# Patient Record
Sex: Male | Born: 1960
Health system: Southern US, Community
[De-identification: ages and names within clinical notes are randomized; demographics above are authoritative.]

## PROBLEM LIST (undated history)

## (undated) DIAGNOSIS — K219 Gastro-esophageal reflux disease without esophagitis: Secondary | ICD-10-CM

## (undated) DIAGNOSIS — C801 Malignant (primary) neoplasm, unspecified: Secondary | ICD-10-CM

## (undated) DIAGNOSIS — K859 Acute pancreatitis without necrosis or infection, unspecified: Secondary | ICD-10-CM

## (undated) DIAGNOSIS — N189 Chronic kidney disease, unspecified: Secondary | ICD-10-CM

## (undated) DIAGNOSIS — A159 Respiratory tuberculosis unspecified: Secondary | ICD-10-CM

## (undated) DIAGNOSIS — M1990 Unspecified osteoarthritis, unspecified site: Secondary | ICD-10-CM

## (undated) HISTORY — PX: FRACTURE SURGERY: SHX138

---

## 1998-05-20 ENCOUNTER — Emergency Department (HOSPITAL_COMMUNITY): Admission: EM | Admit: 1998-05-20 | Discharge: 1998-05-20 | Payer: Self-pay

## 2002-05-31 ENCOUNTER — Emergency Department (HOSPITAL_COMMUNITY): Admission: EM | Admit: 2002-05-31 | Discharge: 2002-05-31 | Payer: Self-pay | Admitting: Emergency Medicine

## 2004-01-31 ENCOUNTER — Emergency Department (HOSPITAL_COMMUNITY): Admission: EM | Admit: 2004-01-31 | Discharge: 2004-01-31 | Payer: Self-pay | Admitting: Emergency Medicine

## 2004-02-09 ENCOUNTER — Emergency Department (HOSPITAL_COMMUNITY): Admission: EM | Admit: 2004-02-09 | Discharge: 2004-02-09 | Payer: Self-pay | Admitting: Family Medicine

## 2004-10-23 ENCOUNTER — Ambulatory Visit: Payer: Self-pay | Admitting: Internal Medicine

## 2004-10-23 ENCOUNTER — Ambulatory Visit: Payer: Self-pay | Admitting: Pulmonary Disease

## 2004-10-23 ENCOUNTER — Inpatient Hospital Stay (HOSPITAL_COMMUNITY): Admission: EM | Admit: 2004-10-23 | Discharge: 2004-10-28 | Payer: Self-pay | Admitting: Family Medicine

## 2005-02-24 ENCOUNTER — Ambulatory Visit: Payer: Self-pay | Admitting: Infectious Diseases

## 2014-03-06 ENCOUNTER — Emergency Department (HOSPITAL_COMMUNITY)
Admission: EM | Admit: 2014-03-06 | Discharge: 2014-03-06 | Disposition: A | Discharge status: Home / Self Care | Payer: PRIVATE HEALTH INSURANCE | Attending: Emergency Medicine | Admitting: Emergency Medicine

## 2014-03-06 ENCOUNTER — Encounter (HOSPITAL_COMMUNITY): Payer: Self-pay | Admitting: Emergency Medicine

## 2014-03-06 DIAGNOSIS — F17200 Nicotine dependence, unspecified, uncomplicated: Secondary | ICD-10-CM | POA: Insufficient documentation

## 2014-03-06 DIAGNOSIS — L259 Unspecified contact dermatitis, unspecified cause: Secondary | ICD-10-CM | POA: Insufficient documentation

## 2014-03-06 MED ORDER — PREDNISONE 20 MG PO TABS: 40.0000 mg | ORAL_TABLET | Freq: Every day | ORAL | Status: DC

## 2014-03-06 MED ORDER — TRIAMCINOLONE ACETONIDE 0.1 % EX CREA: 1.0000 "application " | TOPICAL_CREAM | Freq: Two times a day (BID) | CUTANEOUS | Status: DC

## 2014-03-06 MED ORDER — HYDROXYZINE HCL 25 MG PO TABS: 25.0000 mg | ORAL_TABLET | Freq: Four times a day (QID) | ORAL | Status: DC

## 2014-03-06 NOTE — ED Provider Notes (Signed)
Medical screening examination/treatment/procedure(s) were performed by non-physician practitioner and as supervising physician I was immediately available for consultation/collaboration.   EKG Interpretation None        Ezequiel Essex, MD 03/06/14 [phone]

## 2014-03-06 NOTE — ED Notes (Signed)
Pt states he started feeling itchy last night.  Pt states he was doing yard work yesterday.  Someone cut a vine going up a tree and stuff flew back on him.  Pt has swelling to face.  Airway intact.  Pt speaking in full complete sentences.

## 2014-03-06 NOTE — ED Provider Notes (Signed)
CSN: [id number]     Arrival date & time 03/06/14  0910 History  This chart was scribed for Noland Fordyce, PA working with Ezequiel Essex, MD by Roxan Diesel, ED Scribe. This patient was seen in room TR05C/TR05C and the patient's care was started at 10:01 AM.   Chief Complaint  Patient presents with  . Rash    The history is provided by the patient. No language interpreter was used.    HPI Comments: Stephen [name] is a 53 y.o. male who presents to the Emergency Department complaining of an itchy rash on his face and genital area that began on waking this morning.  Pt states he was doing yard work yesterday and someone cut a vine and it flew back onto him.  He feels the rash may be due to poison ivy.  He states the rash is only on his face and genital area and denies involvement of inside of mouth or nose, chest, back or legs.  He has not attempted to treat rash pta.  In addition to the rash he also presents with swelling to his left eyelids, with associated intermittent itching to the left eye.  He denies difficulty swallowing.  He denies prior episodes of poison ivy exposure.   History reviewed. No pertinent past medical history.   Past Surgical History  Procedure Laterality Date  . Fracture surgery      No family history on file.   History  Substance Use Topics  . Smoking status: Current Every Day Smoker  . Smokeless tobacco: Not on file  . Alcohol Use: Yes     Comment: Beer daily, but none today.     Review of Systems  HENT: Positive for facial swelling. Negative for trouble swallowing.   Eyes: Positive for itching.  Skin: Positive for rash.  All other systems reviewed and are negative.     Allergies  Review of patient's allergies indicates no known allergies.  Home Medications   Prior to Admission medications   Not on File   BP 145/88  Pulse 91  Temp(Src) 97.5 F (36.4 C) (Oral)  Resp 18  SpO2 100%  Physical Exam  Nursing note and vitals  reviewed. Constitutional: He is oriented to person, place, and time. He appears well-developed and well-nourished.  HENT:  Head: Normocephalic and atraumatic.  Eyes: EOM are normal.  Neck: Normal range of motion.  Cardiovascular: Normal rate.   Pulmonary/Chest: Effort normal.  Genitourinary:  Chaperoned exam.  Mild erythema to scrotum.  No vesicles or discharge.  Musculoskeletal: Normal range of motion.  Neurological: He is alert and oriented to person, place, and time.  Skin: Skin is warm and dry. Rash noted.  Diffuse facial edema with erythema, worse on left side of face and around left eye, consistent with contact dermatitis, without evidence of underlying infection.  No eye discharge or erythema.  No discharge from rash to face.  Psychiatric: He has a normal mood and affect. His behavior is normal.    ED Course  Procedures (including critical care time)  DIAGNOSTIC STUDIES: Oxygen Saturation is 100% on room air, normal by my interpretation.    COORDINATION OF CARE: 10:05 AM-Discussed treatment plan which includes prednisone, hydroxyzine, and triamcinolone cream with pt at bedside and pt agreed to plan.     Labs Review Labs Reviewed - No data to display  Imaging Review No results found.   EKG Interpretation None      MDM   Final diagnoses:  Contact dermatitis  Pt presenting with contact dermatitis to face and scrotum.  No evidence of underlying infection. Will tx with prednisone x5 days, atarax for itching as well as triamcinolone cream. Advised pt to only use cream to his face sparingly for severe itching.  May use on other satellite lesions not on his face that may appear later. Return precautions provided. Pt verbalized understanding and agreement with tx plan.   I personally performed the services described in this documentation, which was scribed in my presence. The recorded information has been reviewed and is accurate.   Noland Fordyce, PA-C 03/06/14  1043

## 2014-03-06 NOTE — ED Notes (Signed)
Facial rash/swelling.   Body rash.  Patient states he got into some poison oak.

## 2015-04-14 ENCOUNTER — Encounter (HOSPITAL_COMMUNITY): Payer: Self-pay | Admitting: *Deleted

## 2015-04-14 ENCOUNTER — Emergency Department (HOSPITAL_COMMUNITY)
Admission: EM | Admit: 2015-04-14 | Discharge: 2015-04-14 | Disposition: A | Discharge status: Home / Self Care | Payer: Non-veteran care | Attending: Emergency Medicine | Admitting: Emergency Medicine

## 2015-04-14 ENCOUNTER — Emergency Department (HOSPITAL_COMMUNITY): Payer: Non-veteran care

## 2015-04-14 DIAGNOSIS — K852 Alcohol induced acute pancreatitis without necrosis or infection: Secondary | ICD-10-CM

## 2015-04-14 DIAGNOSIS — R1013 Epigastric pain: Secondary | ICD-10-CM | POA: Diagnosis present

## 2015-04-14 DIAGNOSIS — Z720 Tobacco use: Secondary | ICD-10-CM | POA: Insufficient documentation

## 2015-04-14 DIAGNOSIS — Z7952 Long term (current) use of systemic steroids: Secondary | ICD-10-CM | POA: Insufficient documentation

## 2015-04-14 LAB — CBC WITH DIFFERENTIAL/PLATELET
BASOS ABS: 0 10*3/uL (ref 0.0–0.1)
Basophils Relative: 0 % (ref 0–1)
EOS ABS: 0 10*3/uL (ref 0.0–0.7)
Eosinophils Relative: 0 % (ref 0–5)
HCT: 38 % — ABNORMAL LOW (ref 39.0–52.0)
Hemoglobin: 12.7 g/dL — ABNORMAL LOW (ref 13.0–17.0)
Lymphocytes Relative: 9 % — ABNORMAL LOW (ref 12–46)
Lymphs Abs: 0.9 10*3/uL (ref 0.7–4.0)
MCH: 28.9 pg (ref 26.0–34.0)
MCHC: 33.4 g/dL (ref 30.0–36.0)
MCV: 86.4 fL (ref 78.0–100.0)
MONO ABS: 0.5 10*3/uL (ref 0.1–1.0)
Monocytes Relative: 5 % (ref 3–12)
Neutro Abs: 8.5 10*3/uL — ABNORMAL HIGH (ref 1.7–7.7)
Neutrophils Relative %: 86 % — ABNORMAL HIGH (ref 43–77)
Platelets: 249 10*3/uL (ref 150–400)
RBC: 4.4 MIL/uL (ref 4.22–5.81)
RDW: 14.5 % (ref 11.5–15.5)
WBC: 9.9 10*3/uL (ref 4.0–10.5)

## 2015-04-14 LAB — COMPREHENSIVE METABOLIC PANEL
ALT: 76 U/L — ABNORMAL HIGH (ref 17–63)
AST: 86 U/L — ABNORMAL HIGH (ref 15–41)
Albumin: 3.9 g/dL (ref 3.5–5.0)
Alkaline Phosphatase: 110 U/L (ref 38–126)
Anion gap: 12 (ref 5–15)
BUN: 7 mg/dL (ref 6–20)
CHLORIDE: 96 mmol/L — AB (ref 101–111)
CO2: 27 mmol/L (ref 22–32)
Calcium: 9.5 mg/dL (ref 8.9–10.3)
Creatinine, Ser: 0.86 mg/dL (ref 0.61–1.24)
GFR calc Af Amer: >60 mL/min (ref >60)
Glucose, Bld: 173 mg/dL — ABNORMAL HIGH (ref 65–99)
Potassium: 3.2 mmol/L — ABNORMAL LOW (ref 3.5–5.1)
Sodium: 135 mmol/L (ref 135–145)
Total Bilirubin: 0.8 mg/dL (ref 0.3–1.2)
Total Protein: 7.8 g/dL (ref 6.5–8.1)

## 2015-04-14 LAB — URINALYSIS, ROUTINE W REFLEX MICROSCOPIC
Bilirubin Urine: NEGATIVE
Glucose, UA: NEGATIVE mg/dL
KETONES UR: NEGATIVE mg/dL
Leukocytes, UA: NEGATIVE
NITRITE: NEGATIVE
PROTEIN: NEGATIVE mg/dL
Specific Gravity, Urine: 1.007 (ref 1.005–1.030)
Urobilinogen, UA: 1 mg/dL (ref 0.0–1.0)
pH: 7 (ref 5.0–8.0)

## 2015-04-14 LAB — ETHANOL: Alcohol, Ethyl (B): <5 mg/dL (ref <5)

## 2015-04-14 LAB — LIPASE, BLOOD: Lipase: 288 U/L — ABNORMAL HIGH (ref 22–51)

## 2015-04-14 LAB — URINE MICROSCOPIC-ADD ON

## 2015-04-14 MED ORDER — MORPHINE SULFATE 4 MG/ML IJ SOLN
4.0000 mg | Freq: Once | INTRAMUSCULAR | Status: AC
  Administered 2015-04-14: 4 mg via INTRAVENOUS
  Filled 2015-04-14: qty 1

## 2015-04-14 MED ORDER — SODIUM CHLORIDE 0.9 % IV BOLUS (SEPSIS)
1000.0000 mL | Freq: Once | INTRAVENOUS | Status: AC
  Administered 2015-04-14: 1000 mL via INTRAVENOUS

## 2015-04-14 MED ORDER — OXYCODONE HCL 5 MG PO TABS: 5.0000 mg | ORAL_TABLET | ORAL | Status: DC | PRN

## 2015-04-14 MED ORDER — ONDANSETRON HCL 4 MG/2ML IJ SOLN
4.0000 mg | Freq: Once | INTRAMUSCULAR | Status: AC
  Administered 2015-04-14: 4 mg via INTRAVENOUS
  Filled 2015-04-14: qty 2

## 2015-04-14 MED ORDER — ONDANSETRON 4 MG PO TBDP: 4.0000 mg | ORAL_TABLET | Freq: Three times a day (TID) | ORAL | Status: DC | PRN

## 2015-04-14 MED ORDER — POTASSIUM CHLORIDE CRYS ER 20 MEQ PO TBCR
40.0000 meq | EXTENDED_RELEASE_TABLET | Freq: Once | ORAL | Status: AC
  Administered 2015-04-14: 40 meq via ORAL
  Filled 2015-04-14: qty 2

## 2015-04-14 MED ORDER — KETOROLAC TROMETHAMINE 30 MG/ML IJ SOLN
30.0000 mg | Freq: Once | INTRAMUSCULAR | Status: AC
  Administered 2015-04-14: 30 mg via INTRAVENOUS
  Filled 2015-04-14: qty 1

## 2015-04-14 NOTE — ED Provider Notes (Signed)
CSN: [id number]     Arrival date & time 04/14/15  1511 History   First MD Initiated Contact with Patient 04/14/15 1528     Chief Complaint  Patient presents with  . Abdominal Pain     (Consider location/radiation/quality/duration/timing/severity/associated sxs/prior Treatment) Patient is a 54 y.o. male presenting with abdominal pain. The history is provided by the patient and medical records.  Abdominal Pain Associated symptoms: nausea and vomiting     This is a 54 y.o. M with no significant past medical history presenting to the ED for abdominal pain. Patient reports epigastric abdominal pain which began last night. He reports associated nausea and vomiting, last emesis was 3 hours ago. Pain is described as sharp and stabbing with radiation to his back.  He denies chest pain or SOB.  Patient does admit to drinking 2-3 cans of miller lite daily.  He also ate meatloaf last night but does not think that is the problem.  He denies any fever, chills, sweats.  No urinary symptoms.  No bowel movement yet today.  No prior hx of pancreatitis.  No hx of abdominal surgeries.  VSS.  History reviewed. No pertinent past medical history. Past Surgical History  Procedure Laterality Date  . Fracture surgery     No family history on file. History  Substance Use Topics  . Smoking status: Current Every Day Smoker  . Smokeless tobacco: Not on file  . Alcohol Use: Yes     Comment: Beer daily, but none today.    Review of Systems  Gastrointestinal: Positive for nausea, vomiting and abdominal pain.  All other systems reviewed and are negative.     Allergies  Review of patient's allergies indicates no known allergies.  Home Medications   Prior to Admission medications   Medication Sig Start Date End Date Taking? Authorizing Provider  hydrOXYzine (ATARAX/VISTARIL) 25 MG tablet Take 1 tablet (25 mg total) by mouth every 6 (six) hours. 03/06/14   Noland Fordyce, PA-C  predniSONE (DELTASONE) 20 MG  tablet Take 2 tablets (40 mg total) by mouth daily. 03/06/14   Noland Fordyce, PA-C  triamcinolone cream (KENALOG) 0.1 % Apply 1 application topically 2 (two) times daily. 03/06/14   Noland Fordyce, PA-C   BP 155/89 mmHg  Pulse 90  Temp(Src) 97.6 F (36.4 C) (Oral)  Resp 20  SpO2 100%   Physical Exam  Constitutional: He is oriented to person, place, and time. He appears well-developed and well-nourished. No distress.  HENT:  Head: Normocephalic and atraumatic.  Mouth/Throat: Oropharynx is clear and moist.  Eyes: Conjunctivae and EOM are normal. Pupils are equal, round, and reactive to light.  Neck: Normal range of motion. Neck supple.  Cardiovascular: Normal rate, regular rhythm and normal heart sounds.   Pulmonary/Chest: Effort normal and breath sounds normal. No respiratory distress. He has no wheezes.  Abdominal: Soft. Bowel sounds are normal. There is tenderness in the epigastric area. There is no rigidity, no guarding and no CVA tenderness.  Musculoskeletal: Normal range of motion. He exhibits no edema.  Neurological: He is alert and oriented to person, place, and time.  Skin: Skin is warm and dry. He is not diaphoretic.  Psychiatric: He has a normal mood and affect.  Nursing note and vitals reviewed.   ED Course  Procedures (including critical care time) Labs Review Labs Reviewed  CBC WITH DIFFERENTIAL/PLATELET - Abnormal; Notable for the following:    Hemoglobin 12.7 (*)    HCT 38.0 (*)    Neutrophils Relative %  86 (*)    Neutro Abs 8.5 (*)    Lymphocytes Relative 9 (*)    All other components within normal limits  COMPREHENSIVE METABOLIC PANEL - Abnormal; Notable for the following:    Potassium 3.2 (*)    Chloride 96 (*)    Glucose, Bld 173 (*)    AST 86 (*)    ALT 76 (*)    All other components within normal limits  LIPASE, BLOOD - Abnormal; Notable for the following:    Lipase 288 (*)    All other components within normal limits  URINALYSIS, ROUTINE W REFLEX  MICROSCOPIC (NOT AT [facility]) - Abnormal; Notable for the following:    Hgb urine dipstick TRACE (*)    All other components within normal limits  ETHANOL  URINE MICROSCOPIC-ADD ON    Imaging Review US Abdomen Complete  04/14/2015   CLINICAL DATA:  Epigastric pain  EXAM: ULTRASOUND ABDOMEN COMPLETE  COMPARISON:  None.  FINDINGS: Gallbladder: No gallstones or wall thickening visualized. No sonographic Murphy sign noted.  Common bile duct: Diameter: 5 mm  Liver: No focal lesion identified. Within normal limits in parenchymal echogenicity. Antegrade flow in the imaged hepatic and portal venous system.  IVC: No abnormality visualized.  Pancreas: The main pancreatic duct is visible but does not measure greater than 3 mm.  Spleen: Size and appearance within normal limits.  Right Kidney: Length: 10 cm. Echogenicity within normal limits. No mass or hydronephrosis visualized.  Left Kidney: Length: 10 cm. Echogenicity within normal limits. No mass or hydronephrosis visualized.  Abdominal aorta: No aneurysm visualized.  IMPRESSION: Negative abdominal ultrasound.   Electronically Signed   By: Monte Fantasia M.D.   On: 04/14/2015 18:54     EKG Interpretation None      MDM   Final diagnoses:  Epigastric pain  Alcohol-induced acute pancreatitis   54 year old male here with epigastric abdominal pain, nausea, and vomiting. He has a history of daily alcohol use. Clinical concern for pancreatitis.  Lab work obtained-- lipase elevated at 288, liver enzymes also elevated.  Abdominal u/s negative for gallstones or acute changes about the pancreas.  Patient's pain and nausea are well controlled here in the ED, VS remain stable.  Patient states he is feeling better and wants to try outpatient treatment-- feel this is reasonable.  Will d/c home with pain meds, anti-emetics.  Encouraged liquid diet for the next 48 hours, avoid alcohol.  FU with PCP.  Discussed plan with patient, he/she acknowledged understanding and agreed  with plan of care.  Return precautions given for new or worsening symptoms.  Larene Pickett, PA-C 04/14/15 2228  Davonna Belling, MD 04/14/15 [phone]

## 2015-04-14 NOTE — ED Notes (Signed)
Pt reports mid abd pain with n/v since last night.  Pt reports drinking 2-3 cans of beer daily.  Denies hx of pancreatitis.

## 2015-04-14 NOTE — ED Notes (Signed)
Pt made aware of need for urine sample.  

## 2015-04-14 NOTE — Discharge Instructions (Signed)
Take the prescribed medication as directed to help control symptoms.  Do not drive while taking oxycodone, it can make you drowsy. Make sure you do not drink alcohol, this will make your pancreatitis worse. Follow-up with your doctor. Return to the ED for new or worsening symptoms.   Acute Pancreatitis Acute pancreatitis is a disease in which the pancreas becomes suddenly inflamed. The pancreas is a large gland located behind your stomach. The pancreas produces enzymes that help digest food. The pancreas also releases the hormones glucagon and insulin that help regulate blood sugar. Damage to the pancreas occurs when the digestive enzymes from the pancreas are activated and begin attacking the pancreas before being released into the intestine. Most acute attacks last a couple of days and can cause serious complications. Some people become dehydrated and develop low blood pressure. In severe cases, bleeding into the pancreas can lead to shock and can be life-threatening. The lungs, heart, and kidneys may fail. CAUSES  Pancreatitis can happen to anyone. In some cases, the cause is unknown. Most cases are caused by:  Alcohol abuse.  Gallstones. Other less common causes are:  Certain medicines.  Exposure to certain chemicals.  Infection.  Damage caused by an accident (trauma).  Abdominal surgery. SYMPTOMS   Pain in the upper abdomen that may radiate to the back.  Tenderness and swelling of the abdomen.  Nausea and vomiting. DIAGNOSIS  Your caregiver will perform a physical exam. Blood and stool tests may be done to confirm the diagnosis. Imaging tests may also be done, such as X-rays, CT scans, or an ultrasound of the abdomen. TREATMENT  Treatment usually requires a stay in the hospital. Treatment may include:  Pain medicine.  Fluid replacement through an intravenous line (IV).  Placing a tube in the stomach to remove stomach contents and control vomiting.  Not eating for 3 or  4 days. This gives your pancreas a rest, because enzymes are not being produced that can cause further damage.  Antibiotic medicines if your condition is caused by an infection.  Surgery of the pancreas or gallbladder. HOME CARE INSTRUCTIONS   Follow the diet advised by your caregiver. This may involve avoiding alcohol and decreasing the amount of fat in your diet.  Eat smaller, more frequent meals. This reduces the amount of digestive juices the pancreas produces.  Drink enough fluids to keep your urine clear or pale yellow.  Only take over-the-counter or prescription medicines as directed by your caregiver.  Avoid drinking alcohol if it caused your condition.  Do not smoke.  Get plenty of rest.  Check your blood sugar at home as directed by your caregiver.  Keep all follow-up appointments as directed by your caregiver. SEEK MEDICAL CARE IF:   You do not recover as quickly as expected.  You develop new or worsening symptoms.  You have persistent pain, weakness, or nausea.  You recover and then have another episode of pain. SEEK IMMEDIATE MEDICAL CARE IF:   You are unable to eat or keep fluids down.  Your pain becomes severe.  You have a fever or persistent symptoms for more than 2 to 3 days.  You have a fever and your symptoms suddenly get worse.  Your skin or the white part of your eyes turn yellow (jaundice).  You develop vomiting.  You feel dizzy, or you faint.  Your blood sugar is high (over 300 mg/dL). MAKE SURE YOU:   Understand these instructions.  Will watch your condition.  Will get help  right away if you are not doing well or get worse. Document Released: 10/24/2005 Document Revised: 04/24/2012 Document Reviewed: 02/02/2012 [facility] Patient Information 2015 [place], [place]. This information is not intended to replace advice given to you by your health care provider. Make sure you discuss any questions you have with your health care provider.

## 2016-05-18 ENCOUNTER — Encounter (HOSPITAL_COMMUNITY): Payer: Self-pay

## 2016-05-18 ENCOUNTER — Emergency Department (HOSPITAL_COMMUNITY)
Admission: EM | Admit: 2016-05-18 | Discharge: 2016-05-18 | Disposition: A | Discharge status: Home / Self Care | Payer: Self-pay | Attending: Emergency Medicine | Admitting: Emergency Medicine

## 2016-05-18 DIAGNOSIS — F17210 Nicotine dependence, cigarettes, uncomplicated: Secondary | ICD-10-CM | POA: Insufficient documentation

## 2016-05-18 DIAGNOSIS — R1013 Epigastric pain: Secondary | ICD-10-CM | POA: Insufficient documentation

## 2016-05-18 HISTORY — DX: Acute pancreatitis without necrosis or infection, unspecified: K85.90

## 2016-05-18 LAB — URINALYSIS, ROUTINE W REFLEX MICROSCOPIC
Glucose, UA: NEGATIVE mg/dL
Hgb urine dipstick: NEGATIVE
Ketones, ur: 15 mg/dL — AB
Leukocytes, UA: NEGATIVE
Nitrite: NEGATIVE
Protein, ur: NEGATIVE mg/dL
Specific Gravity, Urine: 1.027 (ref 1.005–1.030)
pH: 5 (ref 5.0–8.0)

## 2016-05-18 LAB — COMPREHENSIVE METABOLIC PANEL
ALT: 63 U/L (ref 17–63)
AST: 92 U/L — ABNORMAL HIGH (ref 15–41)
Albumin: 3.2 g/dL — ABNORMAL LOW (ref 3.5–5.0)
Alkaline Phosphatase: 137 U/L — ABNORMAL HIGH (ref 38–126)
Anion gap: 8 (ref 5–15)
BUN: 6 mg/dL (ref 6–20)
CO2: 24 mmol/L (ref 22–32)
Calcium: 9.4 mg/dL (ref 8.9–10.3)
Chloride: 101 mmol/L (ref 101–111)
Creatinine, Ser: 0.96 mg/dL (ref 0.61–1.24)
GFR calc Af Amer: >60 mL/min (ref >60)
GFR calc non Af Amer: >60 mL/min (ref >60)
Glucose, Bld: 123 mg/dL — ABNORMAL HIGH (ref 65–99)
Potassium: 3.5 mmol/L (ref 3.5–5.1)
Sodium: 133 mmol/L — ABNORMAL LOW (ref 135–145)
Total Bilirubin: 0.8 mg/dL (ref 0.3–1.2)
Total Protein: 7.3 g/dL (ref 6.5–8.1)

## 2016-05-18 LAB — CBC
HCT: 40.9 % (ref 39.0–52.0)
Hemoglobin: 13.4 g/dL (ref 13.0–17.0)
MCH: 28.3 pg (ref 26.0–34.0)
MCHC: 32.8 g/dL (ref 30.0–36.0)
MCV: 86.3 fL (ref 78.0–100.0)
Platelets: 222 10*3/uL (ref 150–400)
RBC: 4.74 MIL/uL (ref 4.22–5.81)
RDW: 13.7 % (ref 11.5–15.5)
WBC: 7.7 10*3/uL (ref 4.0–10.5)

## 2016-05-18 LAB — I-STAT TROPONIN, ED: Troponin i, poc: 0 ng/mL (ref 0.00–0.08)

## 2016-05-18 LAB — LIPASE, BLOOD: Lipase: 66 U/L — ABNORMAL HIGH (ref 11–51)

## 2016-05-18 MED ORDER — FAMOTIDINE 20 MG PO TABS: 20.0000 mg | ORAL_TABLET | Freq: Two times a day (BID) | ORAL | Status: DC

## 2016-05-18 MED ORDER — SODIUM CHLORIDE 0.9 % IV BOLUS (SEPSIS)
1000.0000 mL | Freq: Once | INTRAVENOUS | Status: AC
  Administered 2016-05-18: 1000 mL via INTRAVENOUS

## 2016-05-18 MED ORDER — MORPHINE SULFATE (PF) 4 MG/ML IV SOLN
4.0000 mg | Freq: Once | INTRAVENOUS | Status: AC
  Administered 2016-05-18: 4 mg via INTRAVENOUS
  Filled 2016-05-18: qty 1

## 2016-05-18 MED ORDER — FAMOTIDINE IN NACL 20-0.9 MG/50ML-% IV SOLN
20.0000 mg | Freq: Once | INTRAVENOUS | Status: AC
  Administered 2016-05-18: 20 mg via INTRAVENOUS
  Filled 2016-05-18: qty 50

## 2016-05-18 MED ORDER — ONDANSETRON HCL 4 MG/2ML IJ SOLN
4.0000 mg | Freq: Once | INTRAMUSCULAR | Status: AC
  Administered 2016-05-18: 4 mg via INTRAVENOUS
  Filled 2016-05-18: qty 2

## 2016-05-18 MED ORDER — LORAZEPAM 2 MG/ML IJ SOLN
1.0000 mg | Freq: Once | INTRAMUSCULAR | Status: AC
  Administered 2016-05-18: 1 mg via INTRAVENOUS
  Filled 2016-05-18: qty 1

## 2016-05-18 NOTE — ED Notes (Signed)
Last set vitalsigns  Iv discharge.

## 2016-05-18 NOTE — ED Notes (Signed)
Patient complains of persistent epigastric pain x 4 days, nausea, vomiting and mild diarrhea with same. States has hx of pancreatitis. Drinks beer daily. Alert and oriented on arrival

## 2016-05-18 NOTE — Discharge Instructions (Signed)
I suspect your abdominal pain today is from gastritis. This is most likely caused by alcohol use. You need to stop drinking or your will continue to have problems like this and they will get worse. Please take prescribed medication twice per day. It is to help your stomach heal. Continued alcohol use will make it worse. Avoid NSAIDs such as Goody powder, ibuprofen, advil, naprosyn, etc. Return for worsening symptoms, fever, persistent nausea/vomiting or any other symptoms concerning to you.  Abdominal Pain, Adult Many things can cause abdominal pain. Usually, abdominal pain is not caused by a disease and will improve without treatment. It can often be observed and treated at home. Your health care provider will do a physical exam and possibly order blood tests and X-rays to help determine the seriousness of your pain. However, in many cases, more time must pass before a clear cause of the pain can be found. Before that point, your health care provider may not know if you need more testing or further treatment. HOME CARE INSTRUCTIONS Monitor your abdominal pain for any changes. The following actions may help to alleviate any discomfort you are experiencing:  Only take over-the-counter or prescription medicines as directed by your health care provider.  Do not take laxatives unless directed to do so by your health care provider.  Try a clear liquid diet (broth, tea, or water) as directed by your health care provider. Slowly move to a bland diet as tolerated. SEEK MEDICAL CARE IF:  You have unexplained abdominal pain.  You have abdominal pain associated with nausea or diarrhea.  You have pain when you urinate or have a bowel movement.  You experience abdominal pain that wakes you in the night.  You have abdominal pain that is worsened or improved by eating food.  You have abdominal pain that is worsened with eating fatty foods.  You have a fever. SEEK IMMEDIATE MEDICAL CARE IF:  Your pain  does not go away within 2 hours.  You keep throwing up (vomiting).  Your pain is felt only in portions of the abdomen, such as the right side or the left lower portion of the abdomen.  You pass bloody or black tarry stools. MAKE SURE YOU:  Understand these instructions.  Will watch your condition.  Will get help right away if you are not doing well or get worse.   This information is not intended to replace advice given to you by your health care provider. Make sure you discuss any questions you have with your health care provider.   Document Released: 08/03/2005 Document Revised: 07/15/2015 Document Reviewed: 07/03/2013 Elsevier Interactive Patient Education 2016 [employer]. Substance Abuse Treatment Programs  Intensive Outpatient Programs [facility]     [address], [place]       [facility] [place], [place] Outpatient     (Inpatient and outpatient)     [address]           [place] [phone] (Suboxone and Methadone)  [place],  [address]      [phone]       [phone] [place], [place]  [facility] (Outpatient/Inpatient, [place])    (  insurance only) [phone]             [facility] ([place]) [place], [place]     [facility]     [address]     [place], [place]       [facility] (for caregivers and family) [phone] [address] Kristeen Mans. [place], [place]      Residential Treatment Programs [facility]      [address], [place], [place] [address]  [phone]       [facility] [address] [place], [place] [address] [phone]  [facility] [place]        [phone]       [facility] [phone]  [facility]  ([place].)             [place], [place] or [place], [address] [phone]  [facility] [place]    [address]      [place], [place]       [facility] [address] [place], [place]  [place]   [address] [place], [place] [address]     [phone]      Admissions: 8am-3pm M-F  [facility] (RTS) [address] [place], [place]  BATS Program: Residential Program [phone] Days)   [place], [place] or [phone]     ADATC: [facility] [place], [place] (Walk in Hours over the weekend or by referral)  [facility] [place], [place], [place] [address] [phone]  Crisis Mobile: [facility]:  [phone] (for crisis response 24 hours a day) [facility] Hotline:      [phone] Outpatient Psychiatry and Counseling  [facility]: Mobile Crisis Management 24 hours:  [phone]  [facility] [employer] sliding scale fee and walk in schedule: M-F 8am-12pm/1pm-3pm [place], [place] [address], [place] [address] [phone]  [facility] ([facility] [employer])- new patient walk-in appointments available Monday - Friday 8am -3pm.          [address] [place], [place] [address] [phone] or crisis line- [place] [facility]/ Intensive Outpatient Therapy Program [place], [place] [address] [place]      [phone] [address]     [place], [place] [address]  [facility] [phone]. [place], [place] [address]   [employer] of Care          [address] Johnette Abraham  [place], [place] [address]       [phone]  [facility] [address], [place], [place] [address] [phone]  [facility]    [address] [place], [place] [address]     [place], [place] Joycelyn Man     [place] [address]     [phone]       [facility] [place] [address]  [facility]     [address], [place], [place], [place] [address] [place]     [address]     [place], [place] [address]     [phone]       [facility] [address] [place], [place] [address] [phone] Resources for Temporary Residential Assistance/Crisis [place] [facility]) M-F 8am-3pm   [address], [place] [address]   [phone] Services include: laundry, barbering, support groups, case management, phone  & computer access, showers, AA/NA mtgs, mental health/substance abuse nurse, job skills class, disability information, VA assistance, spiritual classes, etc.   [facility]   [address] [place]     [place]              [employer] (women and children)       [place]. [place], [place] [address] [phone] [email or web address] for application and process Application Required  [facility]   [address]    [place]  [address]     [phone]                    [place], [place] [address] [id number] application appt.) Application Required  [facility] (women only)    [address]     [place], [place] [address]     [phone]      Intake starts 6pm daily Need valid ID, SSC, & Police report [employer] [address] [place], [place] [id number] Application Required  [employer] (men only)     [place].      [place], [place]       [place] [facility] [place] (Pregnant women only) [address] [place], [place]  [facility]      [address]arianna Dani [place].      [place], [place] [address]     [place] [address] [place], [place] 90 day commitment/SA/Application process  [facility](men only)     [address]     [place], [place]       [place] at [place]  Crisis [facility] [address] [place], [place] [address] [phone] Men/Women/Women and Children must be there by 7 pm  [place], [place]

## 2016-05-18 NOTE — ED Notes (Signed)
MD at bedside. 

## 2016-05-18 NOTE — ED Provider Notes (Signed)
CSN: [id number]     Arrival date & time 05/18/16  [id number] History   First MD Initiated Contact with Patient 05/18/16 [phone]     Chief Complaint  Patient presents with  . epigastric pain      (Consider location/radiation/quality/duration/timing/severity/associated sxs/prior Treatment) HPI   55 year old male with abdominal pain. Gradual onset approximately 3 days ago. Pain is in epigastrium. Does not radiate. Relatively constant. Has a past history of alcohol induced pancreatitis. He continues to drink alcohol on almost a daily basis. History urinary complaints. He has felt nauseated and vomited 2-3 times yesterday but none since then. No blood in his emesis. No history of cirrhosis or varices that he is aware of. He did try taking Goody powder but feels like this limited the pain worse. He denies taking NSAIDs on a regular basis. Denies any previous abdominal surgeries.  Past Medical History  Diagnosis Date  . Pancreatitis    Past Surgical History  Procedure Laterality Date  . Fracture surgery     No family history on file. Social History  Substance Use Topics  . Smoking status: Current Every Day Smoker    Types: Cigarettes  . Smokeless tobacco: None  . Alcohol Use: Yes     Comment: Beer daily, but none today.    Review of Systems  All systems reviewed and negative, other than as noted in HPI.   Allergies  Review of patient's allergies indicates no known allergies.  Home Medications   Prior to Admission medications   Medication Sig Start Date End Date Taking? Authorizing Provider  ondansetron (ZOFRAN ODT) 4 MG disintegrating tablet Take 1 tablet (4 mg total) by mouth every 8 (eight) hours as needed for nausea. 04/14/15   Larene Pickett, PA-C  oxyCODONE (OXY IR/ROXICODONE) 5 MG immediate release tablet Take 1 tablet (5 mg total) by mouth every 4 (four) hours as needed for severe pain. 04/14/15   Larene Pickett, PA-C   BP 147/95 mmHg  Pulse 100  Temp(Src) 97.7 F (36.5 C) (Oral)   Resp 13  Ht 5\' 7"  (1.702 m)  Wt 140 lb (63.504 kg)  BMI 21.92 kg/m2  SpO2 100% Physical Exam  Constitutional: He appears well-developed and well-nourished. No distress.  Thin middle-aged male sitting in bed. He does not appear distressed.  HENT:  Head: Normocephalic and atraumatic.  Eyes: Conjunctivae are normal. Right eye exhibits no discharge. Left eye exhibits no discharge.  Neck: Neck supple.  Cardiovascular: Normal rate, regular rhythm and normal heart sounds.  Exam reveals no gallop and no friction rub.   No murmur heard. Pulmonary/Chest: Effort normal and breath sounds normal. No respiratory distress.  Abdominal: Soft. He exhibits no distension. There is tenderness.  Tenderness in epigastrium with voluntary guarding on deep palpation. No rebound. No distention.  Musculoskeletal: He exhibits no edema or tenderness.  Neurological: He is alert.  Skin: Skin is warm and dry.  Psychiatric: He has a normal mood and affect. His behavior is normal. Thought content normal.  Nursing note and vitals reviewed.   ED Course  Procedures (including critical care time) Labs Review Labs Reviewed  LIPASE, BLOOD - Abnormal; Notable for the following:    Lipase 66 (*)    All other components within normal limits  COMPREHENSIVE METABOLIC PANEL - Abnormal; Notable for the following:    Sodium 133 (*)    Glucose, Bld 123 (*)    Albumin 3.2 (*)    AST 92 (*)    Alkaline Phosphatase 137 (*)  All other components within normal limits  URINALYSIS, ROUTINE W REFLEX MICROSCOPIC (NOT AT [facility]) - Abnormal; Notable for the following:    Color, Urine AMBER (*)    Bilirubin Urine SMALL (*)    Ketones, ur 15 (*)    All other components within normal limits  CBC  I-STAT TROPOININ, ED    Imaging Review No results found. I have personally reviewed and evaluated these images and lab results as part of my medical decision-making.   EKG Interpretation   Date/Time:  Wednesday May 18 2016  08:38:55 EDT Ventricular Rate:  122 PR Interval:  128 QRS Duration: 82 QT Interval:  328 QTC Calculation: 467 R Axis:   64 Text Interpretation:  Sinus tachycardia Right atrial enlargement  Borderline ECG Confirmed by Wilson Singer  MD, Mashonda Broski ([id number]) on 05/18/2016 9:21:30  AM      MDM   Final diagnoses:  Epigastric pain    55 year old male with epigastric pain. Suspect likely alcohol induced pancreatitis or gastritis. Less likely biliary colic, PUD,etc. Is tender in epigastrium and does guard with deep palpation.  Very atypical symptoms for ACS. EKG shows tachycardia. This may be secondary to poor by mouth intake, vomiting or possibly some element of withdrawal. No overt ischemic changes. Labs pending. Will treat symptoms in the meantime and reassess.  Labs fairly unremarkable. Very minimal elevation of lipase. Mild elevation in alkaline phosphatase and AST. Mild ketonuria. Symptoms are improved and he has no new complaints. Heart rate has come down. It has been determined that no acute conditions requiring further emergency intervention are present at this time. The patient has been advised of the diagnosis and plan. I reviewed any labs and imaging including any potential incidental findings. We have discussed signs and symptoms that warrant return to the ED and they are listed in the discharge instructions.      Virgel Manifold, MD 05/18/16 1022

## 2017-05-22 ENCOUNTER — Ambulatory Visit (HOSPITAL_COMMUNITY)
Admission: EM | Admit: 2017-05-22 | Discharge: 2017-05-22 | Disposition: A | Discharge status: Other Acute Inpatient Hospital | Payer: Non-veteran care

## 2017-05-22 ENCOUNTER — Emergency Department (HOSPITAL_COMMUNITY)
Admission: EM | Admit: 2017-05-22 | Discharge: 2017-05-22 | Discharge status: Against Medical Advice | Payer: Non-veteran care

## 2017-05-22 NOTE — ED Notes (Signed)
Called pt to be triaged, pt did not answer.

## 2017-05-23 ENCOUNTER — Emergency Department (HOSPITAL_COMMUNITY)
Admission: EM | Admit: 2017-05-23 | Discharge: 2017-05-23 | Disposition: A | Discharge status: Home / Self Care | Payer: Medicare Other | Attending: Emergency Medicine | Admitting: Emergency Medicine

## 2017-05-23 ENCOUNTER — Encounter (HOSPITAL_COMMUNITY): Payer: Self-pay | Admitting: Emergency Medicine

## 2017-05-23 DIAGNOSIS — R112 Nausea with vomiting, unspecified: Secondary | ICD-10-CM | POA: Diagnosis not present

## 2017-05-23 DIAGNOSIS — F17210 Nicotine dependence, cigarettes, uncomplicated: Secondary | ICD-10-CM | POA: Diagnosis not present

## 2017-05-23 DIAGNOSIS — R1013 Epigastric pain: Secondary | ICD-10-CM

## 2017-05-23 DIAGNOSIS — R109 Unspecified abdominal pain: Secondary | ICD-10-CM | POA: Diagnosis present

## 2017-05-23 LAB — COMPREHENSIVE METABOLIC PANEL
ALT: 28 U/L (ref 17–63)
AST: 52 U/L — AB (ref 15–41)
Albumin: 4.1 g/dL (ref 3.5–5.0)
Alkaline Phosphatase: 144 U/L — ABNORMAL HIGH (ref 38–126)
Anion gap: 12 (ref 5–15)
BUN: 12 mg/dL (ref 6–20)
CO2: 27 mmol/L (ref 22–32)
Calcium: 10.2 mg/dL (ref 8.9–10.3)
Chloride: 95 mmol/L — ABNORMAL LOW (ref 101–111)
Creatinine, Ser: 1.11 mg/dL (ref 0.61–1.24)
GFR calc Af Amer: >60 mL/min (ref >60)
Glucose, Bld: 102 mg/dL — ABNORMAL HIGH (ref 65–99)
POTASSIUM: 4.1 mmol/L (ref 3.5–5.1)
SODIUM: 134 mmol/L — AB (ref 135–145)
Total Bilirubin: 0.5 mg/dL (ref 0.3–1.2)
Total Protein: 8.5 g/dL — ABNORMAL HIGH (ref 6.5–8.1)

## 2017-05-23 LAB — URINALYSIS, ROUTINE W REFLEX MICROSCOPIC
Bilirubin Urine: NEGATIVE
GLUCOSE, UA: NEGATIVE mg/dL
Hgb urine dipstick: NEGATIVE
Ketones, ur: NEGATIVE mg/dL
LEUKOCYTES UA: NEGATIVE
Nitrite: NEGATIVE
PH: 9 — AB (ref 5.0–8.0)
Protein, ur: NEGATIVE mg/dL
Specific Gravity, Urine: 1.01 (ref 1.005–1.030)

## 2017-05-23 LAB — CBC
HCT: 42.4 % (ref 39.0–52.0)
Hemoglobin: 15 g/dL (ref 13.0–17.0)
MCH: 30.8 pg (ref 26.0–34.0)
MCHC: 35.4 g/dL (ref 30.0–36.0)
MCV: 87.1 fL (ref 78.0–100.0)
Platelets: 302 10*3/uL (ref 150–400)
RBC: 4.87 MIL/uL (ref 4.22–5.81)
RDW: 13.9 % (ref 11.5–15.5)
WBC: 6.7 10*3/uL (ref 4.0–10.5)

## 2017-05-23 LAB — LIPASE, BLOOD: LIPASE: 62 U/L — AB (ref 11–51)

## 2017-05-23 MED ORDER — MORPHINE SULFATE (PF) 4 MG/ML IV SOLN
4.0000 mg | Freq: Once | INTRAVENOUS | Status: AC
  Administered 2017-05-23: 4 mg via INTRAVENOUS
  Filled 2017-05-23: qty 1

## 2017-05-23 MED ORDER — ONDANSETRON HCL 4 MG/2ML IJ SOLN
4.0000 mg | Freq: Once | INTRAMUSCULAR | Status: AC
  Administered 2017-05-23: 4 mg via INTRAVENOUS
  Filled 2017-05-23: qty 2

## 2017-05-23 MED ORDER — ONDANSETRON 4 MG PO TBDP: 4.0000 mg | ORAL_TABLET | Freq: Three times a day (TID) | ORAL | 0 refills | Status: DC | PRN

## 2017-05-23 MED ORDER — ONDANSETRON 4 MG PO TBDP: 4.0000 mg | ORAL_TABLET | Freq: Once | ORAL | Status: DC | PRN

## 2017-05-23 MED ORDER — SODIUM CHLORIDE 0.9 % IV BOLUS (SEPSIS)
1000.0000 mL | Freq: Once | INTRAVENOUS | Status: AC
  Administered 2017-05-23: 1000 mL via INTRAVENOUS

## 2017-05-23 NOTE — ED Triage Notes (Signed)
Pt reports having abd pain along with nausea and vomiting for the last 2 days. Pt reports emesis is clear in color.

## 2017-05-23 NOTE — Discharge Instructions (Signed)
Take your medication as prescribed as needed for pain relief. Continue drinking fluids at home to remain hydrated. I recommend refraining from drinking alcohol. Please follow up with a primary care provider from the Resource Guide provided below in 4-5 days as needed.  Please return to the Emergency Department if symptoms worsen or new onset of fever, chest pain, difficulty breathing, vomiting, blood, unable to keep fluids down, blood in stool.

## 2017-05-23 NOTE — ED Notes (Signed)
Pt given ginger ale and saltine crackers for PO trial, per his request.

## 2017-05-23 NOTE — ED Provider Notes (Signed)
[facility] DEPT Provider Note   CSN: [id number] Arrival date & time: 05/23/17  0159     History   Chief Complaint Chief Complaint  Patient presents with  . Abdominal Pain    HPI Stephen [name] is a 56 y.o. male.  HPI   Patient is a 56 year old male with history of pancreatitis who presents the ED with complaint of abdominal pain, onset 2 days. Patient reports having intermittent sharp pain to his upper abdomen with associated nausea and vomiting. He reports his abdominal pain feels similar to episodes of pancreatitis he has had in the past. Patient endorses drinking 2-3 beers nightly and states he last drank yesterday. Denies fever, chills, chest pain, shortness of breath, cough, hematemesis, diarrhea, constipation, urinary symptoms, flank pain. Patient denies history of abdominal surgeries. Denies any known sick contacts. Reports taking over-the-counter Tums without improvement of symptoms.  Past Medical History:  Diagnosis Date  . Pancreatitis     There are no active problems to display for this patient.   Past Surgical History:  Procedure Laterality Date  . FRACTURE SURGERY         Home Medications    Prior to Admission medications   Medication Sig Start Date End Date Taking? Authorizing Provider  famotidine (PEPCID) 20 MG tablet Take 1 tablet (20 mg total) by mouth 2 (two) times daily. Patient not taking: Reported on 05/23/2017 05/18/16   Virgel Manifold, MD  ondansetron (ZOFRAN ODT) 4 MG disintegrating tablet Take 1 tablet (4 mg total) by mouth every 8 (eight) hours as needed for nausea or vomiting. 05/23/17   Nona Dell, PA-C    Family History History reviewed. No pertinent family history.  Social History Social History  Substance Use Topics  . Smoking status: Current Every Day Smoker    Types: Cigarettes  . Smokeless tobacco: Never Used  . Alcohol use Yes     Comment: Beer daily, but none today.     Allergies   Patient has no known  allergies.   Review of Systems Review of Systems  Gastrointestinal: Positive for abdominal pain, nausea and vomiting.  All other systems reviewed and are negative.    Physical Exam Updated Vital Signs BP (!) 150/102   Pulse 67   Temp 98.1 F (36.7 C) (Oral)   Resp 16   Ht 5\' 7"  (1.702 m)   Wt 52.2 kg (115 lb)   SpO2 99%   BMI 18.01 kg/m   Physical Exam  Constitutional: He is oriented to person, place, and time. He appears well-developed and well-nourished. No distress.  HENT:  Head: Normocephalic and atraumatic.  Mouth/Throat: Uvula is midline, oropharynx is clear and moist and mucous membranes are normal. No oropharyngeal exudate, posterior oropharyngeal edema, posterior oropharyngeal erythema or tonsillar abscesses. No tonsillar exudate.  Eyes: Conjunctivae and EOM are normal. Right eye exhibits no discharge. Left eye exhibits no discharge. No scleral icterus.  Neck: Normal range of motion. Neck supple.  Cardiovascular: Normal rate, regular rhythm, normal heart sounds and intact distal pulses.   Pulmonary/Chest: Effort normal and breath sounds normal. No respiratory distress. He has no wheezes. He has no rales. He exhibits no tenderness.  Abdominal: Soft. Normal appearance and bowel sounds are normal. He exhibits no distension and no mass. There is tenderness. There is no rigidity, no rebound, no guarding and no CVA tenderness. No hernia.  Mild diffuse abdominal tenderness, worse over epigastric region  Musculoskeletal: He exhibits no edema.  Neurological: He is alert and oriented to  person, place, and time.  Skin: Skin is warm and dry. He is not diaphoretic.  Nursing note and vitals reviewed.    ED Treatments / Results  Labs (all labs ordered are listed, but only abnormal results are displayed) Labs Reviewed  LIPASE, BLOOD - Abnormal; Notable for the following:       Result Value   Lipase 62 (*)    All other components within normal limits  COMPREHENSIVE METABOLIC  PANEL - Abnormal; Notable for the following:    Sodium 134 (*)    Chloride 95 (*)    Glucose, Bld 102 (*)    Total Protein 8.5 (*)    AST 52 (*)    Alkaline Phosphatase 144 (*)    All other components within normal limits  URINALYSIS, ROUTINE W REFLEX MICROSCOPIC - Abnormal; Notable for the following:    APPearance CLOUDY (*)    pH 9.0 (*)    All other components within normal limits  CBC    EKG  EKG Interpretation None       Radiology No results found.  Procedures Procedures (including critical care time)  Medications Ordered in ED Medications  ondansetron (ZOFRAN-ODT) disintegrating tablet 4 mg (not administered)  sodium chloride 0.9 % bolus 1,000 mL (0 mLs Intravenous Stopped 05/23/17 0544)  ondansetron (ZOFRAN) injection 4 mg (4 mg Intravenous Given 05/23/17 0430)  morphine 4 MG/ML injection 4 mg (4 mg Intravenous Given 05/23/17 0430)     Initial Impression / Assessment and Plan / ED Course  I have reviewed the triage vital signs and the nursing notes.  Pertinent labs & imaging results that were available during my care of the patient were reviewed by me and considered in my medical decision making (see chart for details).    Patient presents with upper abdominal pain with associated nausea and vomiting consistent with prior episodes of pancreatitis he has had in the past. Endorses drinking alcohol, reports having 2-3 beers daily and states he last drank yesterday. Denies fever. VSS. Exam revealed mild diffuse abdominal tenderness, slightly worse over epigastric region, no peritoneal signs. Remaining exam unremarkable. Patient given IV fluids, pain meds and anti-medics. Labs revealed AST 52, alkaline phosphatase 144, lipase 62. Remaining labs unremarkable. Mildly elevated LFTs. Be consistent with patient's baseline values. On reevaluation patient reports significant improvement of symptoms, tolerating PO. Suspect sxs related to chronic pancreatitis 2/2 alcohol abuse. No  indication of appendicitis, bowel obstruction, bowel perforation, cholecystitis, diverticulitis, warranting further workup/imaging at this time.  Patient discharged home with symptomatic treatment and given strict instructions for follow-up with their primary care physician.  I have also discussed reasons to return immediately to the ER.  Patient expresses understanding and agrees with plan.    Final Clinical Impressions(s) / ED Diagnoses   Final diagnoses:  Epigastric pain  Non-intractable vomiting with nausea, unspecified vomiting type    New Prescriptions New Prescriptions   ONDANSETRON (ZOFRAN ODT) 4 MG DISINTEGRATING TABLET    Take 1 tablet (4 mg total) by mouth every 8 (eight) hours as needed for nausea or vomiting.     Nona Dell, PA-C 54/65/03 [id number]    Delora Fuel, MD 68/12/75 [phone]

## 2017-06-18 ENCOUNTER — Emergency Department (HOSPITAL_COMMUNITY)
Admission: EM | Admit: 2017-06-18 | Discharge: 2017-06-19 | Disposition: A | Discharge status: Home / Self Care | Payer: Non-veteran care | Attending: Emergency Medicine | Admitting: Emergency Medicine

## 2017-06-18 ENCOUNTER — Encounter (HOSPITAL_COMMUNITY): Payer: Self-pay | Admitting: *Deleted

## 2017-06-18 DIAGNOSIS — E871 Hypo-osmolality and hyponatremia: Secondary | ICD-10-CM

## 2017-06-18 DIAGNOSIS — R112 Nausea with vomiting, unspecified: Secondary | ICD-10-CM | POA: Insufficient documentation

## 2017-06-18 DIAGNOSIS — F17210 Nicotine dependence, cigarettes, uncomplicated: Secondary | ICD-10-CM | POA: Diagnosis not present

## 2017-06-18 DIAGNOSIS — Z8719 Personal history of other diseases of the digestive system: Secondary | ICD-10-CM | POA: Diagnosis not present

## 2017-06-18 DIAGNOSIS — Z79899 Other long term (current) drug therapy: Secondary | ICD-10-CM | POA: Diagnosis not present

## 2017-06-18 DIAGNOSIS — R1011 Right upper quadrant pain: Secondary | ICD-10-CM | POA: Diagnosis present

## 2017-06-18 DIAGNOSIS — R1013 Epigastric pain: Secondary | ICD-10-CM | POA: Diagnosis not present

## 2017-06-18 DIAGNOSIS — K852 Alcohol induced acute pancreatitis without necrosis or infection: Secondary | ICD-10-CM | POA: Diagnosis not present

## 2017-06-18 LAB — CBC
HCT: 42.7 % (ref 39.0–52.0)
HEMOGLOBIN: 15 g/dL (ref 13.0–17.0)
MCH: 30.6 pg (ref 26.0–34.0)
MCHC: 35.1 g/dL (ref 30.0–36.0)
MCV: 87.1 fL (ref 78.0–100.0)
Platelets: 299 10*3/uL (ref 150–400)
RBC: 4.9 MIL/uL (ref 4.22–5.81)
RDW: 13.9 % (ref 11.5–15.5)
WBC: 8.5 10*3/uL (ref 4.0–10.5)

## 2017-06-18 LAB — URINALYSIS, ROUTINE W REFLEX MICROSCOPIC
BACTERIA UA: NONE SEEN
BILIRUBIN URINE: NEGATIVE
Glucose, UA: NEGATIVE mg/dL
Ketones, ur: 5 mg/dL — AB
LEUKOCYTES UA: NEGATIVE
NITRITE: NEGATIVE
PH: 6 (ref 5.0–8.0)
Protein, ur: 30 mg/dL — AB
SPECIFIC GRAVITY, URINE: 1.016 (ref 1.005–1.030)

## 2017-06-18 LAB — COMPREHENSIVE METABOLIC PANEL
ALK PHOS: 101 U/L (ref 38–126)
ALT: 23 U/L (ref 17–63)
ANION GAP: 13 (ref 5–15)
AST: 38 U/L (ref 15–41)
Albumin: 3.8 g/dL (ref 3.5–5.0)
BILIRUBIN TOTAL: 0.7 mg/dL (ref 0.3–1.2)
BUN: 5 mg/dL — ABNORMAL LOW (ref 6–20)
CALCIUM: 9.7 mg/dL (ref 8.9–10.3)
CO2: 26 mmol/L (ref 22–32)
Chloride: 93 mmol/L — ABNORMAL LOW (ref 101–111)
Creatinine, Ser: 0.81 mg/dL (ref 0.61–1.24)
GFR calc Af Amer: >60 mL/min (ref >60)
Glucose, Bld: 108 mg/dL — ABNORMAL HIGH (ref 65–99)
POTASSIUM: 3.6 mmol/L (ref 3.5–5.1)
Sodium: 132 mmol/L — ABNORMAL LOW (ref 135–145)
Total Protein: 8.1 g/dL (ref 6.5–8.1)

## 2017-06-18 LAB — LIPASE, BLOOD: Lipase: 145 U/L — ABNORMAL HIGH (ref 11–51)

## 2017-06-18 MED ORDER — FENTANYL CITRATE (PF) 100 MCG/2ML IJ SOLN
50.0000 ug | INTRAMUSCULAR | Status: DC | PRN
  Administered 2017-06-18: 50 ug via INTRAVENOUS

## 2017-06-18 MED ORDER — MORPHINE SULFATE (PF) 4 MG/ML IV SOLN
4.0000 mg | Freq: Once | INTRAVENOUS | Status: AC
  Administered 2017-06-18: 4 mg via INTRAVENOUS
  Filled 2017-06-18: qty 1

## 2017-06-18 MED ORDER — SODIUM CHLORIDE 0.9 % IV BOLUS (SEPSIS)
1000.0000 mL | Freq: Once | INTRAVENOUS | Status: AC
Start: 2017-06-18 — End: 2017-06-19
  Administered 2017-06-18: 1000 mL via INTRAVENOUS

## 2017-06-18 MED ORDER — FENTANYL CITRATE (PF) 100 MCG/2ML IJ SOLN
INTRAMUSCULAR | Status: AC
  Filled 2017-06-18: qty 2

## 2017-06-18 MED ORDER — ONDANSETRON HCL 4 MG/2ML IJ SOLN
4.0000 mg | Freq: Once | INTRAMUSCULAR | Status: AC
  Administered 2017-06-18: 4 mg via INTRAVENOUS

## 2017-06-18 MED ORDER — ONDANSETRON HCL 4 MG/2ML IJ SOLN
INTRAMUSCULAR | Status: AC
  Filled 2017-06-18: qty 2

## 2017-06-18 NOTE — ED Triage Notes (Signed)
Pt c/o upper abdominal pain for 3 days with NV, hx of pancreatitis, pain feels similar as previous episodes

## 2017-06-18 NOTE — ED Provider Notes (Signed)
[place] [facility] DEPT Provider Note   CSN: [id number] Arrival date & time: 06/18/17  1959     History   Chief Complaint Chief Complaint  Patient presents with  . Abdominal Pain    HPI Stephen [name] is a 56 y.o. male.  The history is provided by the patient.  He complains of epigastric pain for last 3 days. Pain sometimes radiates to the right upper quadrant. Pain is described as a dull ache, and is severe. He rates it at 8/10. It is worse after drinking water. There has been associated nausea and vomiting. Vomiting does not affect the pain. He does admit to drinking beer prior to pain starting. He does have a history of pancreatitis. He denies fever, but has had some chills and sweats. He denies constipation or diarrhea.  Past Medical History:  Diagnosis Date  . Pancreatitis     There are no active problems to display for this patient.   Past Surgical History:  Procedure Laterality Date  . FRACTURE SURGERY         Home Medications    Prior to Admission medications   Medication Sig Start Date End Date Taking? Authorizing Provider  famotidine (PEPCID) 20 MG tablet Take 1 tablet (20 mg total) by mouth 2 (two) times daily. Patient not taking: Reported on 05/23/2017 05/18/16   Virgel Manifold, MD  ondansetron (ZOFRAN ODT) 4 MG disintegrating tablet Take 1 tablet (4 mg total) by mouth every 8 (eight) hours as needed for nausea or vomiting. 05/23/17   Nona Dell, PA-C    Family History No family history on file.  Social History Social History  Substance Use Topics  . Smoking status: Current Every Day Smoker    Types: Cigarettes  . Smokeless tobacco: Never Used  . Alcohol use Yes     Comment: Beer daily, but none today.     Allergies   Patient has no known allergies.   Review of Systems Review of Systems  All other systems reviewed and are negative.    Physical Exam Updated Vital Signs BP (!) 155/96   Pulse 80   Temp 97.9 F (36.6 C) (Oral)    Resp 13   Ht 5\' 7"  (1.702 m)   Wt 52.2 kg (115 lb)   SpO2 100%   BMI 18.01 kg/m   Physical Exam  Nursing note and vitals reviewed.  56 year old male, resting comfortably and in no acute distress. Vital signs are significant for hypertension. Oxygen saturation is 100%, which is normal. Head is normocephalic and atraumatic. PERRLA, EOMI. Oropharynx is clear. Neck is nontender and supple without adenopathy or JVD. Back is nontender and there is no CVA tenderness. Lungs are clear without rales, wheezes, or rhonchi. Chest is nontender. Heart has regular rate and rhythm without murmur. Abdomen is soft, flat, with moderate epigastric tenderness. There is no rebound or guarding. There are no masses or hepatosplenomegaly and peristalsis is hypoactive. Extremities have no cyanosis or edema, full range of motion is present. Skin is warm and dry without rash. Neurologic: Mental status is normal, cranial nerves are intact, there are no motor or sensory deficits.  ED Treatments / Results  Labs (all labs ordered are listed, but only abnormal results are displayed) Labs Reviewed  LIPASE, BLOOD - Abnormal; Notable for the following:       Result Value   Lipase 145 (*)    All other components within normal limits  COMPREHENSIVE METABOLIC PANEL - Abnormal; Notable for the following:  Sodium 132 (*)    Chloride 93 (*)    Glucose, Bld 108 (*)    BUN 5 (*)    All other components within normal limits  URINALYSIS, ROUTINE W REFLEX MICROSCOPIC - Abnormal; Notable for the following:    Hgb urine dipstick SMALL (*)    Ketones, ur 5 (*)    Protein, ur 30 (*)    Squamous Epithelial / LPF 0-5 (*)    All other components within normal limits  CBC    EKG  EKG Interpretation None       Radiology No results found.  Procedures Procedures (including critical care time)  Medications Ordered in ED Medications  fentaNYL (SUBLIMAZE) injection 50 mcg (50 mcg Intravenous Given 06/18/17 2114)    sodium chloride 0.9 % bolus 1,000 mL (not administered)  morphine 4 MG/ML injection 4 mg (not administered)  ondansetron (ZOFRAN) injection 4 mg (4 mg Intravenous Given 06/18/17 2113)     Initial Impression / Assessment and Plan / ED Course  I have reviewed the triage vital signs and the nursing notes.  Pertinent lab results that were available during my care of the patient were reviewed by me and considered in my medical decision making (see chart for details).  Abdominal pain consistent with pancreatitis. Old records are reviewed, and he has several previous ED visits for alcohol-induced pancreatitis. Current episode started after drinking beer. He did receive ondansetron and fentanyl prior to my seeing him. Fentanyl gave temporary relief of pain, ondansetron gave good relief of nausea. He has not required admission in the past. He will be given IV fluids and morphine and reassessed. Laboratory evaluation did show moderate elevation of lipase, and mild hyponatremia.  He had good relief of pain and nausea with above noted treatment. He is discharged with prescription for oxycodone have acetaminophen, and ondansetron. Return precautions discussed. Advised to abstain from alcohol.  Final Clinical Impressions(s) / ED Diagnoses   Final diagnoses:  Alcohol-induced acute pancreatitis without infection or necrosis  Hyponatremia    New Prescriptions New Prescriptions   ONDANSETRON (ZOFRAN) 4 MG TABLET    Take 1 tablet (4 mg total) by mouth every 6 (six) hours as needed for nausea or vomiting.   OXYCODONE-ACETAMINOPHEN (PERCOCET) 5-325 MG TABLET    Take 1 tablet by mouth every 4 (four) hours as needed for moderate pain.     Delora Fuel, MD 28/31/51 [phone]

## 2017-06-18 NOTE — ED Notes (Signed)
Pt actively vomiting in waiting room; appears uncomfortable

## 2017-06-18 NOTE — ED Notes (Signed)
Pt reports having middle upper abd pains for the past 3 days. Pt reports no nausea and vomiting until he got the ED and had to wait in the waiting room. Pt states he vomiting 1 time while waiting and has had constant nausea since then.

## 2017-06-19 MED ORDER — OXYCODONE-ACETAMINOPHEN 5-325 MG PO TABS: 1.0000 | ORAL_TABLET | ORAL | 0 refills | Status: DC | PRN

## 2017-06-19 MED ORDER — ONDANSETRON HCL 4 MG PO TABS: 4.0000 mg | ORAL_TABLET | Freq: Four times a day (QID) | ORAL | 0 refills | Status: DC | PRN

## 2017-06-19 NOTE — Discharge Instructions (Signed)
Your pancreas is inflamed because of alcohol. Do not consume any alcohol at all - beer, wine, or liquor. If you consume alcohol, you will have more abdominal pain.

## 2017-08-06 ENCOUNTER — Encounter (HOSPITAL_COMMUNITY): Payer: Self-pay | Admitting: Emergency Medicine

## 2017-08-06 ENCOUNTER — Emergency Department (HOSPITAL_COMMUNITY)
Admission: EM | Admit: 2017-08-06 | Discharge: 2017-08-06 | Disposition: A | Discharge status: Home / Self Care | Payer: Non-veteran care | Attending: Emergency Medicine | Admitting: Emergency Medicine

## 2017-08-06 DIAGNOSIS — Z5321 Procedure and treatment not carried out due to patient leaving prior to being seen by health care provider: Secondary | ICD-10-CM | POA: Insufficient documentation

## 2017-08-06 DIAGNOSIS — R109 Unspecified abdominal pain: Secondary | ICD-10-CM | POA: Diagnosis present

## 2017-08-06 LAB — COMPREHENSIVE METABOLIC PANEL
ALT: 38 U/L (ref 17–63)
AST: 44 U/L — ABNORMAL HIGH (ref 15–41)
Albumin: 4.3 g/dL (ref 3.5–5.0)
Alkaline Phosphatase: 79 U/L (ref 38–126)
Anion gap: 12 (ref 5–15)
BILIRUBIN TOTAL: 1.1 mg/dL (ref 0.3–1.2)
BUN: 5 mg/dL — AB (ref 6–20)
CHLORIDE: 95 mmol/L — AB (ref 101–111)
CO2: 25 mmol/L (ref 22–32)
CREATININE: 0.95 mg/dL (ref 0.61–1.24)
Calcium: 9.6 mg/dL (ref 8.9–10.3)
Glucose, Bld: 117 mg/dL — ABNORMAL HIGH (ref 65–99)
POTASSIUM: 3.4 mmol/L — AB (ref 3.5–5.1)
Sodium: 132 mmol/L — ABNORMAL LOW (ref 135–145)
TOTAL PROTEIN: 8.8 g/dL — AB (ref 6.5–8.1)

## 2017-08-06 LAB — CBC
HCT: 45.8 % (ref 39.0–52.0)
Hemoglobin: 15.7 g/dL (ref 13.0–17.0)
MCH: 29.8 pg (ref 26.0–34.0)
MCHC: 34.3 g/dL (ref 30.0–36.0)
MCV: 87.1 fL (ref 78.0–100.0)
PLATELETS: 363 10*3/uL (ref 150–400)
RBC: 5.26 MIL/uL (ref 4.22–5.81)
RDW: 14.1 % (ref 11.5–15.5)
WBC: 7.8 10*3/uL (ref 4.0–10.5)

## 2017-08-06 LAB — URINALYSIS, ROUTINE W REFLEX MICROSCOPIC
BACTERIA UA: NONE SEEN
BILIRUBIN URINE: NEGATIVE
Glucose, UA: NEGATIVE mg/dL
Ketones, ur: 20 mg/dL — AB
LEUKOCYTES UA: NEGATIVE
NITRITE: NEGATIVE
PH: 6 (ref 5.0–8.0)
Protein, ur: 30 mg/dL — AB
SPECIFIC GRAVITY, URINE: 1.017 (ref 1.005–1.030)

## 2017-08-06 LAB — LIPASE, BLOOD: LIPASE: 50 U/L (ref 11–51)

## 2017-08-06 NOTE — ED Triage Notes (Signed)
Pt. Stated, Stephen [name] been having right side pain about 3 days ago, just the pain.

## 2018-01-05 ENCOUNTER — Emergency Department (HOSPITAL_COMMUNITY): Payer: Medicare Other

## 2018-01-05 ENCOUNTER — Encounter (HOSPITAL_COMMUNITY): Payer: Self-pay | Admitting: Emergency Medicine

## 2018-01-05 ENCOUNTER — Inpatient Hospital Stay (HOSPITAL_COMMUNITY)
Admission: EM | Admit: 2018-01-05 | Discharge: 2018-01-09 | DRG: 439 | Disposition: A | Discharge status: Home / Self Care | Payer: Medicare Other | Attending: Internal Medicine | Admitting: Internal Medicine

## 2018-01-05 ENCOUNTER — Other Ambulatory Visit: Payer: Self-pay

## 2018-01-05 DIAGNOSIS — D473 Essential (hemorrhagic) thrombocythemia: Secondary | ICD-10-CM | POA: Diagnosis present

## 2018-01-05 DIAGNOSIS — K852 Alcohol induced acute pancreatitis without necrosis or infection: Principal | ICD-10-CM | POA: Diagnosis present

## 2018-01-05 DIAGNOSIS — F10239 Alcohol dependence with withdrawal, unspecified: Secondary | ICD-10-CM | POA: Diagnosis present

## 2018-01-05 DIAGNOSIS — D72829 Elevated white blood cell count, unspecified: Secondary | ICD-10-CM | POA: Diagnosis present

## 2018-01-05 DIAGNOSIS — R1011 Right upper quadrant pain: Secondary | ICD-10-CM | POA: Diagnosis not present

## 2018-01-05 DIAGNOSIS — E861 Hypovolemia: Secondary | ICD-10-CM | POA: Diagnosis present

## 2018-01-05 DIAGNOSIS — Z8719 Personal history of other diseases of the digestive system: Secondary | ICD-10-CM

## 2018-01-05 DIAGNOSIS — M549 Dorsalgia, unspecified: Secondary | ICD-10-CM | POA: Diagnosis not present

## 2018-01-05 DIAGNOSIS — K8521 Alcohol induced acute pancreatitis with uninfected necrosis: Secondary | ICD-10-CM | POA: Diagnosis not present

## 2018-01-05 DIAGNOSIS — R112 Nausea with vomiting, unspecified: Secondary | ICD-10-CM | POA: Diagnosis not present

## 2018-01-05 DIAGNOSIS — R000 Tachycardia, unspecified: Secondary | ICD-10-CM | POA: Diagnosis present

## 2018-01-05 DIAGNOSIS — E440 Moderate protein-calorie malnutrition: Secondary | ICD-10-CM

## 2018-01-05 DIAGNOSIS — E876 Hypokalemia: Secondary | ICD-10-CM | POA: Diagnosis present

## 2018-01-05 DIAGNOSIS — D75839 Thrombocytosis, unspecified: Secondary | ICD-10-CM | POA: Diagnosis present

## 2018-01-05 DIAGNOSIS — K861 Other chronic pancreatitis: Secondary | ICD-10-CM | POA: Diagnosis present

## 2018-01-05 DIAGNOSIS — E871 Hypo-osmolality and hyponatremia: Secondary | ICD-10-CM | POA: Diagnosis present

## 2018-01-05 DIAGNOSIS — F17210 Nicotine dependence, cigarettes, uncomplicated: Secondary | ICD-10-CM | POA: Diagnosis present

## 2018-01-05 DIAGNOSIS — K859 Acute pancreatitis without necrosis or infection, unspecified: Secondary | ICD-10-CM | POA: Diagnosis not present

## 2018-01-05 LAB — CBC
HCT: 47.5 % (ref 39.0–52.0)
HEMOGLOBIN: 16.6 g/dL (ref 13.0–17.0)
MCH: 30.3 pg (ref 26.0–34.0)
MCHC: 34.9 g/dL (ref 30.0–36.0)
MCV: 86.7 fL (ref 78.0–100.0)
PLATELETS: 405 10*3/uL — AB (ref 150–400)
RBC: 5.48 MIL/uL (ref 4.22–5.81)
RDW: 13.7 % (ref 11.5–15.5)
WBC: 11.1 10*3/uL — AB (ref 4.0–10.5)

## 2018-01-05 LAB — COMPREHENSIVE METABOLIC PANEL
ALK PHOS: 127 U/L — AB (ref 38–126)
ALT: 47 U/L (ref 17–63)
ANION GAP: 17 — AB (ref 5–15)
AST: 65 U/L — ABNORMAL HIGH (ref 15–41)
Albumin: 4.4 g/dL (ref 3.5–5.0)
BILIRUBIN TOTAL: 1 mg/dL (ref 0.3–1.2)
BUN: 7 mg/dL (ref 6–20)
CALCIUM: 9.9 mg/dL (ref 8.9–10.3)
CO2: 25 mmol/L (ref 22–32)
Chloride: 92 mmol/L — ABNORMAL LOW (ref 101–111)
Creatinine, Ser: 0.97 mg/dL (ref 0.61–1.24)
Glucose, Bld: 143 mg/dL — ABNORMAL HIGH (ref 65–99)
Potassium: 3.7 mmol/L (ref 3.5–5.1)
SODIUM: 134 mmol/L — AB (ref 135–145)
TOTAL PROTEIN: 8.6 g/dL — AB (ref 6.5–8.1)

## 2018-01-05 LAB — LIPASE, BLOOD: Lipase: 306 U/L — ABNORMAL HIGH (ref 11–51)

## 2018-01-05 MED ORDER — SODIUM CHLORIDE 0.9 % IV BOLUS (SEPSIS)
1000.0000 mL | Freq: Once | INTRAVENOUS | Status: AC
  Administered 2018-01-06: 1000 mL via INTRAVENOUS

## 2018-01-05 MED ORDER — ONDANSETRON HCL 4 MG/2ML IJ SOLN
4.0000 mg | Freq: Once | INTRAMUSCULAR | Status: AC
  Administered 2018-01-05: 4 mg via INTRAVENOUS
  Filled 2018-01-05: qty 2

## 2018-01-05 MED ORDER — HYDROMORPHONE HCL 1 MG/ML IJ SOLN
1.0000 mg | Freq: Once | INTRAMUSCULAR | Status: AC
  Administered 2018-01-05: 1 mg via INTRAVENOUS
  Filled 2018-01-05: qty 1

## 2018-01-05 MED ORDER — SODIUM CHLORIDE 0.9 % IV BOLUS (SEPSIS)
1000.0000 mL | Freq: Once | INTRAVENOUS | Status: AC
  Administered 2018-01-05: 1000 mL via INTRAVENOUS

## 2018-01-05 MED ORDER — IOPAMIDOL (ISOVUE-300) INJECTION 61%
INTRAVENOUS | Status: AC
  Administered 2018-01-05: 100 mL
  Filled 2018-01-05: qty 100

## 2018-01-05 MED ORDER — SODIUM CHLORIDE 0.9 % IV SOLN
INTRAVENOUS | Status: DC
  Administered 2018-01-06: 01:00:00 via INTRAVENOUS

## 2018-01-05 NOTE — ED Provider Notes (Signed)
[facility] EMERGENCY DEPARTMENT Provider Note   CSN: [id number] Arrival date & time: 01/05/18  2038     History   Chief Complaint Chief Complaint  Patient presents with  . Abdominal Pain  . Back Pain    HPI Stephen [name] is a 57 y.o. male.  The history is provided by the patient.  Abdominal Pain   This is a recurrent problem. Episode onset: 3 days ago. The problem occurs constantly. The problem has been gradually worsening. The pain is associated with an unknown factor. The pain is located in the LUQ, RUQ and epigastric region. The quality of the pain is cramping, shooting, throbbing and sharp. The pain is at a severity of 10/10. The pain is severe. Associated symptoms include anorexia, nausea, vomiting and myalgias. Pertinent negatives include fever, diarrhea and dysuria. The symptoms are aggravated by eating and vomiting. Nothing relieves the symptoms. Past medical history comments: prior hx of pancreatitis.  pt does drink beer daily.  Back Pain   Associated symptoms include abdominal pain. Pertinent negatives include no fever and no dysuria.    Past Medical History:  Diagnosis Date  . Pancreatitis     There are no active problems to display for this patient.   Past Surgical History:  Procedure Laterality Date  . FRACTURE SURGERY         Home Medications    Prior to Admission medications   Medication Sig Start Date End Date Taking? Authorizing Provider  ondansetron (ZOFRAN) 4 MG tablet Take 1 tablet (4 mg total) by mouth every 6 (six) hours as needed for nausea or vomiting. 9/47/09   Delora Fuel, MD  oxyCODONE-acetaminophen (PERCOCET) 5-325 MG tablet Take 1 tablet by mouth every 4 (four) hours as needed for moderate pain. 05/05/35   Delora Fuel, MD    Family History No family history on file.  Social History Social History   Tobacco Use  . Smoking status: Current Every Day Smoker    Types: Cigarettes  . Smokeless tobacco: Never Used    Substance Use Topics  . Alcohol use: Yes    Comment: Beer daily, but none today.  . Drug use: Yes    Types: Marijuana     Allergies   Patient has no known allergies.   Review of Systems Review of Systems  Constitutional: Negative for fever.  Gastrointestinal: Positive for abdominal pain, anorexia, nausea and vomiting. Negative for diarrhea.  Genitourinary: Negative for dysuria.  Musculoskeletal: Positive for back pain and myalgias.  All other systems reviewed and are negative.    Physical Exam Updated Vital Signs BP (!) 148/96 (BP Location: Left Arm)   Pulse (!) 57   Resp 18   Ht 5\' 6"  (1.676 m)   Wt 61.2 kg (135 lb)   SpO2 96%   BMI 21.79 kg/m   Physical Exam  Constitutional: He is oriented to person, place, and time. He appears well-developed and well-nourished. He appears ill. He appears distressed.  HENT:  Head: Normocephalic and atraumatic.  Mouth/Throat: Oropharynx is clear and moist. Mucous membranes are dry.  Eyes: Conjunctivae and EOM are normal. Pupils are equal, round, and reactive to light.  Neck: Normal range of motion. Neck supple.  Cardiovascular: Regular rhythm and intact distal pulses. Tachycardia present.  No murmur heard. Pulmonary/Chest: Effort normal and breath sounds normal. No respiratory distress. He has no wheezes. He has no rales.  Abdominal: Soft. He exhibits no distension. There is tenderness in the right upper quadrant, epigastric area  and left upper quadrant. There is guarding. There is no rebound.  Musculoskeletal: Normal range of motion. He exhibits no edema or tenderness.  Neurological: He is alert and oriented to person, place, and time.  Skin: Skin is warm and dry. No rash noted. No erythema.  Psychiatric: He has a normal mood and affect. His behavior is normal.  Nursing note and vitals reviewed.    ED Treatments / Results  Labs (all labs ordered are listed, but only abnormal results are displayed) Labs Reviewed  LIPASE,  BLOOD - Abnormal; Notable for the following components:      Result Value   Lipase 306 (*)    All other components within normal limits  COMPREHENSIVE METABOLIC PANEL - Abnormal; Notable for the following components:   Sodium 134 (*)    Chloride 92 (*)    Glucose, Bld 143 (*)    Total Protein 8.6 (*)    AST 65 (*)    Alkaline Phosphatase 127 (*)    Anion gap 17 (*)    All other components within normal limits  CBC - Abnormal; Notable for the following components:   WBC 11.1 (*)    Platelets 405 (*)    All other components within normal limits  URINALYSIS, ROUTINE W REFLEX MICROSCOPIC    EKG  EKG Interpretation None       Radiology Ct Abdomen Pelvis W Contrast  Result Date: 01/05/2018 CLINICAL DATA:  Abdominal pain radiating to the back for 3 days. Nausea and vomiting. Pancreatitis, acute. EXAM: CT ABDOMEN AND PELVIS WITH CONTRAST TECHNIQUE: Multidetector CT imaging of the abdomen and pelvis was performed using the standard protocol following bolus administration of intravenous contrast. CONTRAST:  185mL ISOVUE-300 IOPAMIDOL (ISOVUE-300) INJECTION 61% COMPARISON:  None. FINDINGS: Lower chest: No consolidation or pleural fluid. Hepatobiliary: Enhancing 10 mm focus in the left lobe of the liver. Gallbladder physiologically distended, no calcified stone. No biliary dilatation. Pancreas: Moderate peripancreatic fat stranding about the entire pancreas, most prominent about the head and uncinate process. Ill-defined developing fluid collection in the uncinate process measures 14 x 9 mm (best appreciated on delayed phase image 14 series 8), with adjacent ill-defined low-density. Minimal low-density in the uncinate process and pancreatic head. Small amount of peripancreatic free fluid and fluid in the lesser sac. No ductal dilatation. No pancreatic calcifications. Spleen: Normal in size without focal abnormality. Adrenals/Urinary Tract: Normal adrenal glands. No hydronephrosis or perinephric  edema. Homogeneous renal enhancement with symmetric excretion on delayed phase imaging. Urinary bladder is physiologically distended without wall thickening. Stomach/Bowel: Stomach physiologically distended. Mild wall thickening of the duodenum is likely reactive. Prominent fluid-filled distal small bowel without obstruction. Mild wall thickening of the hepatic flexure of the colon in the region of pancreatic inflammation. Scattered colonic diverticula involving the ascending and descending colon without diverticulitis. Normal appendix. Vascular/Lymphatic: Aorto bi-iliac atherosclerosis, moderate. No aneurysm. Splenic and portal veins are patent. No enlarged abdominal or pelvic lymph nodes. Reproductive: Prostate is unremarkable. Round soft tissue density in the right inguinal canal, presumed inguinal testis. Other: Peripancreatic and retroperitoneal stranding and ill-defined fluid. Edema tracks in both pericolic gutters. No free air. Musculoskeletal: Avascular necrosis of bilateral femoral heads without subchondral collapse. Mild degenerative change in the spine. IMPRESSION: 1. Acute pancreatitis with moderate diffuse peripancreatic edema. Low-density in the pancreatic head and uncinate process favored to be pancreatic edema rather than pancreatic necrosis. Small developing ill-defined fluid collection in the uncinate process measuring 14 x 9 mm. 2. Enhancing 10 mm focus in the  left lobe of the liver is likely a flash filling hemangioma or arterial portal shunt. 3. Right inguinal testis, may be transient, recommend correlation with physical exam. 4. Avascular necrosis of bilateral femoral heads without subchondral collapse. 5. Moderate Aortic Atherosclerosis (ICD10-I70.0). Electronically Signed   By: Jeb Levering M.D.   On: 01/05/2018 23:32    Procedures Procedures (including critical care time)  Medications Ordered in ED Medications  sodium chloride 0.9 % bolus 1,000 mL (not administered)    HYDROmorphone (DILAUDID) injection 1 mg (not administered)  ondansetron (ZOFRAN) injection 4 mg (not administered)     Initial Impression / Assessment and Plan / ED Course  I have reviewed the triage vital signs and the nursing notes.  Pertinent labs & imaging results that were available during my care of the patient were reviewed by me and considered in my medical decision making (see chart for details).     Patient is a 57 year old with a history of prior pancreatitis who does drink 2-3 beers daily presenting with 3 days of abdominal pain that is worsening with vomiting.  Patient has had no prior abdominal surgeries and takes no medications regularly.  Patient is tachycardic with normal blood pressure on exam.  He denies any urinary symptoms.  Abdominal exam with significant tenderness in the epigastric, left upper quadrant and right upper quadrant.  There is guarding present in these areas as well.  Patient will be given IV fluids, pain and nausea medication.  CBC, CMP, lipase are pending.  Concern for recurrent pancreatitis versus gallbladder pathology versus possible perforation.  11:38 PM Labs are significant for a lipase of 306, mild elevation of AST of 65 but renal function within normal limits.  Mild leukocytosis of 11,000.  Patient significantly improved after IV pain medication.  Still tachycardic after a liter of fluid.  Will give a second liter and started on a rate.  CT shows acute pancreatitis with moderate diffuse peri-pancreatic edema as well as concern for developing fluid collection that is currently 14 x 9 mm.  Will admit for ongoing treatment of acute pancreatitis.  Final Clinical Impressions(s) / ED Diagnoses   Final diagnoses:  Alcohol-induced acute pancreatitis with uninfected necrosis    ED Discharge Orders    None       Blanchie Dessert, MD 01/05/18 2339

## 2018-01-05 NOTE — ED Notes (Signed)
Patient transported to CT 

## 2018-01-05 NOTE — ED Triage Notes (Signed)
Pt to ED with c/o abd pain radiating around to back x's 3 days worse today with nausea and vomiting   Pt has hx of pancreatitis, last beer this am.

## 2018-01-05 NOTE — ED Notes (Addendum)
Contact brother Wright Gravely at [phone]. Gibe to pt for he may or may not remember his brothers number.

## 2018-01-05 NOTE — ED Notes (Signed)
Unable to get pt temp at this moment due to pt moving around

## 2018-01-06 ENCOUNTER — Encounter (HOSPITAL_COMMUNITY): Payer: Self-pay | Admitting: Family Medicine

## 2018-01-06 DIAGNOSIS — D75839 Thrombocytosis, unspecified: Secondary | ICD-10-CM | POA: Diagnosis present

## 2018-01-06 DIAGNOSIS — K8521 Alcohol induced acute pancreatitis with uninfected necrosis: Secondary | ICD-10-CM | POA: Diagnosis not present

## 2018-01-06 DIAGNOSIS — E861 Hypovolemia: Secondary | ICD-10-CM | POA: Diagnosis present

## 2018-01-06 DIAGNOSIS — D72829 Elevated white blood cell count, unspecified: Secondary | ICD-10-CM | POA: Diagnosis present

## 2018-01-06 DIAGNOSIS — Z8719 Personal history of other diseases of the digestive system: Secondary | ICD-10-CM | POA: Diagnosis not present

## 2018-01-06 DIAGNOSIS — D473 Essential (hemorrhagic) thrombocythemia: Secondary | ICD-10-CM

## 2018-01-06 DIAGNOSIS — K852 Alcohol induced acute pancreatitis without necrosis or infection: Secondary | ICD-10-CM | POA: Diagnosis not present

## 2018-01-06 DIAGNOSIS — R000 Tachycardia, unspecified: Secondary | ICD-10-CM | POA: Diagnosis present

## 2018-01-06 DIAGNOSIS — E871 Hypo-osmolality and hyponatremia: Secondary | ICD-10-CM | POA: Diagnosis not present

## 2018-01-06 DIAGNOSIS — E440 Moderate protein-calorie malnutrition: Secondary | ICD-10-CM | POA: Diagnosis present

## 2018-01-06 DIAGNOSIS — F17210 Nicotine dependence, cigarettes, uncomplicated: Secondary | ICD-10-CM | POA: Diagnosis present

## 2018-01-06 DIAGNOSIS — E876 Hypokalemia: Secondary | ICD-10-CM | POA: Diagnosis present

## 2018-01-06 DIAGNOSIS — K861 Other chronic pancreatitis: Secondary | ICD-10-CM | POA: Diagnosis present

## 2018-01-06 DIAGNOSIS — F10239 Alcohol dependence with withdrawal, unspecified: Secondary | ICD-10-CM | POA: Diagnosis present

## 2018-01-06 LAB — COMPREHENSIVE METABOLIC PANEL
ALK PHOS: 104 U/L (ref 38–126)
ALT: 37 U/L (ref 17–63)
AST: 44 U/L — ABNORMAL HIGH (ref 15–41)
Albumin: 3.5 g/dL (ref 3.5–5.0)
Anion gap: 15 (ref 5–15)
BUN: 5 mg/dL — ABNORMAL LOW (ref 6–20)
CALCIUM: 8.3 mg/dL — AB (ref 8.9–10.3)
CO2: 20 mmol/L — AB (ref 22–32)
CREATININE: 0.84 mg/dL (ref 0.61–1.24)
Chloride: 101 mmol/L (ref 101–111)
GFR calc non Af Amer: >60 mL/min (ref >60)
Glucose, Bld: 102 mg/dL — ABNORMAL HIGH (ref 65–99)
Potassium: 3.2 mmol/L — ABNORMAL LOW (ref 3.5–5.1)
SODIUM: 136 mmol/L (ref 135–145)
Total Bilirubin: 1 mg/dL (ref 0.3–1.2)
Total Protein: 6.9 g/dL (ref 6.5–8.1)

## 2018-01-06 LAB — URINALYSIS, ROUTINE W REFLEX MICROSCOPIC
BACTERIA UA: NONE SEEN
Bilirubin Urine: NEGATIVE
GLUCOSE, UA: NEGATIVE mg/dL
KETONES UR: 20 mg/dL — AB
Leukocytes, UA: NEGATIVE
NITRITE: NEGATIVE
PROTEIN: NEGATIVE mg/dL
Specific Gravity, Urine: 1.034 — ABNORMAL HIGH (ref 1.005–1.030)
Squamous Epithelial / LPF: NONE SEEN
pH: 6 (ref 5.0–8.0)

## 2018-01-06 LAB — HIV ANTIBODY (ROUTINE TESTING W REFLEX): HIV Screen 4th Generation wRfx: NONREACTIVE

## 2018-01-06 LAB — CBC WITH DIFFERENTIAL/PLATELET
BASOS PCT: 0 %
Basophils Absolute: 0 10*3/uL (ref 0.0–0.1)
EOS ABS: 0 10*3/uL (ref 0.0–0.7)
Eosinophils Relative: 0 %
HCT: 44.2 % (ref 39.0–52.0)
HEMOGLOBIN: 14.9 g/dL (ref 13.0–17.0)
Lymphocytes Relative: 14 %
Lymphs Abs: 1.7 10*3/uL (ref 0.7–4.0)
MCH: 29 pg (ref 26.0–34.0)
MCHC: 33.7 g/dL (ref 30.0–36.0)
MCV: 86 fL (ref 78.0–100.0)
MONOS PCT: 10 %
Monocytes Absolute: 1.2 10*3/uL — ABNORMAL HIGH (ref 0.1–1.0)
NEUTROS PCT: 76 %
Neutro Abs: 9.3 10*3/uL — ABNORMAL HIGH (ref 1.7–7.7)
PLATELETS: 325 10*3/uL (ref 150–400)
RBC: 5.14 MIL/uL (ref 4.22–5.81)
RDW: 13.4 % (ref 11.5–15.5)
WBC: 12.3 10*3/uL — ABNORMAL HIGH (ref 4.0–10.5)

## 2018-01-06 LAB — GLUCOSE, CAPILLARY: Glucose-Capillary: 82 mg/dL (ref 65–99)

## 2018-01-06 LAB — MAGNESIUM: MAGNESIUM: 1.8 mg/dL (ref 1.7–2.4)

## 2018-01-06 LAB — LIPASE, BLOOD: LIPASE: 363 U/L — AB (ref 11–51)

## 2018-01-06 MED ORDER — KCL-LACTATED RINGERS 20 MEQ/L IV SOLN
INTRAVENOUS | Status: DC
  Administered 2018-01-06 – 2018-01-07 (×3): via INTRAVENOUS
  Filled 2018-01-06 (×4): qty 1000

## 2018-01-06 MED ORDER — HYDROMORPHONE HCL 1 MG/ML IJ SOLN
0.5000 mg | INTRAMUSCULAR | Status: DC | PRN
  Administered 2018-01-06 – 2018-01-09 (×16): 1 mg via INTRAVENOUS
  Filled 2018-01-06 (×16): qty 1

## 2018-01-06 MED ORDER — LORAZEPAM 2 MG/ML IJ SOLN
1.0000 mg | Freq: Four times a day (QID) | INTRAMUSCULAR | Status: AC | PRN
  Administered 2018-01-06: 1 mg via INTRAVENOUS
  Filled 2018-01-06: qty 1

## 2018-01-06 MED ORDER — ENOXAPARIN SODIUM 40 MG/0.4ML ~~LOC~~ SOLN
40.0000 mg | SUBCUTANEOUS | Status: DC
  Administered 2018-01-06 – 2018-01-09 (×4): 40 mg via SUBCUTANEOUS
  Filled 2018-01-06 (×4): qty 0.4

## 2018-01-06 MED ORDER — FOLIC ACID 1 MG PO TABS
1.0000 mg | ORAL_TABLET | Freq: Every day | ORAL | Status: DC
  Administered 2018-01-06 – 2018-01-09 (×4): 1 mg via ORAL
  Filled 2018-01-06 (×4): qty 1

## 2018-01-06 MED ORDER — ACETAMINOPHEN 325 MG PO TABS: 650.0000 mg | ORAL_TABLET | Freq: Four times a day (QID) | ORAL | Status: DC | PRN

## 2018-01-06 MED ORDER — ACETAMINOPHEN 650 MG RE SUPP: 650.0000 mg | Freq: Four times a day (QID) | RECTAL | Status: DC | PRN

## 2018-01-06 MED ORDER — FAMOTIDINE IN NACL 20-0.9 MG/50ML-% IV SOLN
20.0000 mg | Freq: Two times a day (BID) | INTRAVENOUS | Status: DC
  Administered 2018-01-06 – 2018-01-08 (×6): 20 mg via INTRAVENOUS
  Filled 2018-01-06 (×8): qty 50

## 2018-01-06 MED ORDER — VITAMIN B-1 100 MG PO TABS
100.0000 mg | ORAL_TABLET | Freq: Every day | ORAL | Status: DC
  Administered 2018-01-06 – 2018-01-09 (×4): 100 mg via ORAL
  Filled 2018-01-06 (×4): qty 1

## 2018-01-06 MED ORDER — ONDANSETRON HCL 4 MG/2ML IJ SOLN: 4.0000 mg | Freq: Four times a day (QID) | INTRAMUSCULAR | Status: DC | PRN

## 2018-01-06 MED ORDER — LORAZEPAM 2 MG/ML IJ SOLN: 0.0000 mg | Freq: Two times a day (BID) | INTRAMUSCULAR | Status: DC

## 2018-01-06 MED ORDER — LORAZEPAM 2 MG/ML IJ SOLN: 0.0000 mg | Freq: Four times a day (QID) | INTRAMUSCULAR | Status: AC

## 2018-01-06 MED ORDER — LORAZEPAM 1 MG PO TABS: 1.0000 mg | ORAL_TABLET | Freq: Four times a day (QID) | ORAL | Status: AC | PRN

## 2018-01-06 MED ORDER — HYDRALAZINE HCL 20 MG/ML IJ SOLN
10.0000 mg | INTRAMUSCULAR | Status: DC | PRN
  Administered 2018-01-06: 10 mg via INTRAVENOUS
  Filled 2018-01-06: qty 1

## 2018-01-06 MED ORDER — ONDANSETRON HCL 4 MG PO TABS
4.0000 mg | ORAL_TABLET | Freq: Four times a day (QID) | ORAL | Status: DC | PRN
Start: 2018-01-06 — End: 2018-01-09

## 2018-01-06 MED ORDER — THIAMINE HCL 100 MG/ML IJ SOLN
100.0000 mg | Freq: Every day | INTRAMUSCULAR | Status: DC
  Filled 2018-01-06: qty 2

## 2018-01-06 MED ORDER — SODIUM CHLORIDE 0.9 % IV SOLN
INTRAVENOUS | Status: DC
  Administered 2018-01-06 (×2): via INTRAVENOUS

## 2018-01-06 MED ORDER — POTASSIUM CHLORIDE CRYS ER 20 MEQ PO TBCR
20.0000 meq | EXTENDED_RELEASE_TABLET | Freq: Once | ORAL | Status: AC
  Administered 2018-01-06: 20 meq via ORAL
  Filled 2018-01-06: qty 1

## 2018-01-06 MED ORDER — ADULT MULTIVITAMIN W/MINERALS CH
1.0000 | ORAL_TABLET | Freq: Every day | ORAL | Status: DC
  Administered 2018-01-06 – 2018-01-09 (×4): 1 via ORAL
  Filled 2018-01-06 (×4): qty 1

## 2018-01-06 NOTE — H&P (Signed)
History and Physical    DENTON DERKS LGX:[id number] DOB: Feb 11, 1961 DOA: 01/05/2018  PCP: Patient, No Pcp Per   Patient coming from: Home  Chief Complaint: Severe abdominal pain with nausea and non-bloody emesis   HPI: STARK AGUINAGA is a 57 y.o. male with medical history significant for alcoholism and history of pancreatitis, now presenting to the emergency department for evaluation of severe abdominal pain with nausea and nonbloody vomiting.  Patient reports that he had been in his usual state of health until approximately 3 days ago when he pain in the abdomen.  Pain is generalized, constant, most severe in the upper abdomen, associated with nausea, and couple episodes of nonbloody vomiting.  He describes his symptoms as very similar to this with pancreatitis.  He continues to drink beer daily.  Denies fevers, chills, chest pain, or shortness of breath.  ED Course: Upon arrival to the ED, patient is found to be afebrile, saturating well on room air, tachycardic to 120, and hypertensive.  Chemistry panel is notable for a slight hyponatremia and lipase of 306.  CBC features a leukocytosis to 11,100 and a thrombocytosis with platelets of 405,000.  CT of the abdomen and pelvis reveals moderate diffuse peripancreatic edema and developing ill-defined fluid collection consistent with acute pancreatitis.  He was treated with 2 L of normal saline and 1 mg of IV Dilaudid in the ED.  Tachycardia has improved, blood pressure remained stable, there is no apparent respiratory distress, patient will be admitted to the medical-surgical unit for ongoing evaluation and management of acute alcoholic pancreatitis.  Review of Systems:  All other systems reviewed and apart from HPI, are negative.  Past Medical History:  Diagnosis Date  . Pancreatitis     Past Surgical History:  Procedure Laterality Date  . FRACTURE SURGERY       reports that he has been smoking cigarettes.  he has never used smokeless  tobacco. He reports that he drinks alcohol. He reports that he uses drugs. Drug: Marijuana.  No Known Allergies  History reviewed. No pertinent family history.   Prior to Admission medications   Medication Sig Start Date End Date Taking? Authorizing Provider  ondansetron (ZOFRAN) 4 MG tablet Take 1 tablet (4 mg total) by mouth every 6 (six) hours as needed for nausea or vomiting. 06/21/47   Delora Fuel, MD  oxyCODONE-acetaminophen (PERCOCET) 5-325 MG tablet Take 1 tablet by mouth every 4 (four) hours as needed for moderate pain. 1/85/63   Delora Fuel, MD    Physical Exam: Vitals:   01/05/18 2231 01/05/18 2243 01/06/18 0026 01/06/18 0030  BP: (!) 148/97  (!) 163/101 (!) 164/108  Pulse: (!) 111 (!) 103 (!) 101 (!) 101  Resp: 12 12 11  (!) 8  Temp: 97.7 F (36.5 C)     TempSrc: Oral     SpO2: 94% 100% 100% 98%  Weight:      Height:          Constitutional: NAD, calm, comfortable Eyes: PERTLA, lids and conjunctivae normal ENMT: Mucous membranes are moist. Posterior pharynx clear of any exudate or lesions.   Neck: normal, supple, no masses, no thyromegaly Respiratory: clear to auscultation bilaterally, no wheezing, no crackles. Normal respiratory effort. No accessory muscle use.  Cardiovascular: S1 & S2 heard, regular rate and rhythm, no significant murmurs / rubs / gallops. No extremity edema. 2+ pedal pulses. No carotid bruits. No significant JVD. Abdomen: No distension, no tenderness, no masses palpated. Bowel sounds normal.  Musculoskeletal: no  clubbing / cyanosis. No joint deformity upper and lower extremities. Normal muscle tone.  Skin: no significant rashes, lesions, ulcers. Warm, dry, well-perfused. Neurologic: CN 2-12 grossly intact. Sensation intact, DTR normal. Strength 5/5 in all 4 limbs.  Psychiatric: Normal judgment and insight. Alert and oriented x 3. Normal mood and affect.     Labs on Admission: I have personally reviewed following labs and imaging  studies  CBC: Recent Labs  Lab 01/05/18 2050  WBC 11.1*  HGB 16.6  HCT 47.5  MCV 86.7  PLT 161*   Basic Metabolic Panel: Recent Labs  Lab 01/05/18 2050  NA 134*  K 3.7  CL 92*  CO2 25  GLUCOSE 143*  BUN 7  CREATININE 0.97  CALCIUM 9.9   GFR: Estimated Creatinine Clearance: 71.9 mL/min (by C-G formula based on SCr of 0.97 mg/dL). Liver Function Tests: Recent Labs  Lab 01/05/18 2050  AST 65*  ALT 47  ALKPHOS 127*  BILITOT 1.0  PROT 8.6*  ALBUMIN 4.4   Recent Labs  Lab 01/05/18 2050  LIPASE 306*   No results for input(s): AMMONIA in the last 168 hours. Coagulation Profile: No results for input(s): INR, PROTIME in the last 168 hours. Cardiac Enzymes: No results for input(s): CKTOTAL, CKMB, CKMBINDEX, TROPONINI in the last 168 hours. BNP (last 3 results) No results for input(s): PROBNP in the last 8760 hours. HbA1C: No results for input(s): HGBA1C in the last 72 hours. CBG: No results for input(s): GLUCAP in the last 168 hours. Lipid Profile: No results for input(s): CHOL, HDL, LDLCALC, TRIG, CHOLHDL, LDLDIRECT in the last 72 hours. Thyroid Function Tests: No results for input(s): TSH, T4TOTAL, FREET4, T3FREE, THYROIDAB in the last 72 hours. Anemia Panel: No results for input(s): VITAMINB12, FOLATE, FERRITIN, TIBC, IRON, RETICCTPCT in the last 72 hours. Urine analysis:    Component Value Date/Time   COLORURINE YELLOW 08/06/2017 0840   APPEARANCEUR CLEAR 08/06/2017 0840   LABSPEC 1.017 08/06/2017 0840   PHURINE 6.0 08/06/2017 0840   GLUCOSEU NEGATIVE 08/06/2017 0840   HGBUR SMALL (A) 08/06/2017 0840   BILIRUBINUR NEGATIVE 08/06/2017 0840   KETONESUR 20 (A) 08/06/2017 0840   PROTEINUR 30 (A) 08/06/2017 0840   UROBILINOGEN 1.0 04/14/2015 1728   NITRITE NEGATIVE 08/06/2017 0840   LEUKOCYTESUR NEGATIVE 08/06/2017 0840   Sepsis Labs: @LABRCNTIP (procalcitonin:4,lacticidven:4) )No results found for this or any previous visit (from the past 240  hour(s)).   Radiological Exams on Admission: Ct Abdomen Pelvis W Contrast  Result Date: 01/05/2018 CLINICAL DATA:  Abdominal pain radiating to the back for 3 days. Nausea and vomiting. Pancreatitis, acute. EXAM: CT ABDOMEN AND PELVIS WITH CONTRAST TECHNIQUE: Multidetector CT imaging of the abdomen and pelvis was performed using the standard protocol following bolus administration of intravenous contrast. CONTRAST:  155mL ISOVUE-300 IOPAMIDOL (ISOVUE-300) INJECTION 61% COMPARISON:  None. FINDINGS: Lower chest: No consolidation or pleural fluid. Hepatobiliary: Enhancing 10 mm focus in the left lobe of the liver. Gallbladder physiologically distended, no calcified stone. No biliary dilatation. Pancreas: Moderate peripancreatic fat stranding about the entire pancreas, most prominent about the head and uncinate process. Ill-defined developing fluid collection in the uncinate process measures 14 x 9 mm (best appreciated on delayed phase image 14 series 8), with adjacent ill-defined low-density. Minimal low-density in the uncinate process and pancreatic head. Small amount of peripancreatic free fluid and fluid in the lesser sac. No ductal dilatation. No pancreatic calcifications. Spleen: Normal in size without focal abnormality. Adrenals/Urinary Tract: Normal adrenal glands. No hydronephrosis or perinephric edema.  Homogeneous renal enhancement with symmetric excretion on delayed phase imaging. Urinary bladder is physiologically distended without wall thickening. Stomach/Bowel: Stomach physiologically distended. Mild wall thickening of the duodenum is likely reactive. Prominent fluid-filled distal small bowel without obstruction. Mild wall thickening of the hepatic flexure of the colon in the region of pancreatic inflammation. Scattered colonic diverticula involving the ascending and descending colon without diverticulitis. Normal appendix. Vascular/Lymphatic: Aorto bi-iliac atherosclerosis, moderate. No aneurysm.  Splenic and portal veins are patent. No enlarged abdominal or pelvic lymph nodes. Reproductive: Prostate is unremarkable. Round soft tissue density in the right inguinal canal, presumed inguinal testis. Other: Peripancreatic and retroperitoneal stranding and ill-defined fluid. Edema tracks in both pericolic gutters. No free air. Musculoskeletal: Avascular necrosis of bilateral femoral heads without subchondral collapse. Mild degenerative change in the spine. IMPRESSION: 1. Acute pancreatitis with moderate diffuse peripancreatic edema. Low-density in the pancreatic head and uncinate process favored to be pancreatic edema rather than pancreatic necrosis. Small developing ill-defined fluid collection in the uncinate process measuring 14 x 9 mm. 2. Enhancing 10 mm focus in the left lobe of the liver is likely a flash filling hemangioma or arterial portal shunt. 3. Right inguinal testis, may be transient, recommend correlation with physical exam. 4. Avascular necrosis of bilateral femoral heads without subchondral collapse. 5. Moderate Aortic Atherosclerosis (ICD10-I70.0). Electronically Signed   By: Jeb Levering M.D.   On: 01/05/2018 23:32    EKG: Not performed.   Assessment/Plan  1. Acute alcoholic pancreatitis  - Presents with 3 days of severe abdominal pain, nausea, and occasional non-bloody vomiting; described as similar to prior bouts of acute pancreatitis  - Lipase is elevated and CT findings consistent with acute pancreatitis with developing fluid-collection  - Treated in ED with 2 liters NS and IV analgesia  - Continue aggressive IVF hydration, bowel-rest, IV analgesics, CIWA with prn Ativan, infection surveillance, monitor lytes    2. Hyponatremia  - Serum sodium slightly low in setting of hypovolemia  - Continue IVF with NS, repeat chem panel in am   3. Leukocytosis, thrombocytosis  - Mild leukocytosis and thrombocytosis on admission, likely reactive to #1  - Repeat CBC in am    DVT  prophylaxis: Lovenox Code Status: Full  Family Communication: Discussed with patient  Disposition Plan: Admit to med-surg Consults called: None Admission status: Inpatient    Vianne Bulls, MD [facility] Pager [phone]  If 7PM-7AM, please contact night-coverage [email or web address] Password [email or web address]  01/06/2018, 1:23 AM

## 2018-01-06 NOTE — Progress Notes (Signed)
Received patient from ED, AOx4, able to scoop himself to other bed without assistance.  VS stable with elevated BP at 156/85, tachy at PR=103, O2Sat at 100% on RA, pain at 6/10 and anxious, complaining of itchiness.  Administered PRN med Ativan 1mg  IV per order.  Patient oriented to room, bed control and call light. Now resting on bed comfortably with both eyes closed. Will monitor.

## 2018-01-06 NOTE — Progress Notes (Signed)
[facility] PROGRESS NOTE  [name] HBZ:[id number] DOB: [birth year] DOA: 01/05/2018 PCP: Llc, [place]  Brief summary   57 y.o. male with medical history significant for alcoholism and history of pancreatitis, now presenting to the emergency department for evaluation of severe abdominal pain with nausea and nonbloody vomiting.  Patient reports that he had been in his usual state of health until approximately 3 days ago when he pain in the abdomen.  Pain is generalized, constant, most severe in the upper abdomen, associated with nausea, and couple episodes of nonbloody vomiting.  He describes his symptoms as very similar to this with pancreatitis.  He continues to drink beer daily.  Denies fevers, chills, chest pain, or shortness of breath.  ED Course: Upon arrival to the ED, patient is found to be afebrile, saturating well on room air, tachycardic to 120, and hypertensive.  Chemistry panel is notable for a slight hyponatremia and lipase of 306.  CBC features a leukocytosis to 11,100 and a thrombocytosis with platelets of 405,000.  CT of the abdomen and pelvis reveals moderate diffuse peripancreatic edema and developing ill-defined fluid collection consistent with acute pancreatitis.  He was treated with 2 L of normal saline and 1 mg of IV Dilaudid in the ED.  Tachycardia has improved, blood pressure remained stable, there is no apparent respiratory distress, patient will be admitted to the medical-surgical unit for ongoing evaluation and management of acute alcoholic pancreatitis.   Assessment/Plan:  Acute alcoholic pancreatitis. Presents with 3 days of severe abdominal pain, nausea, and occasional non-bloody vomiting; described as similar to prior bouts of acute pancreatitis. Lipase is elevated and CT findings consistent with acute pancreatitis with developing fluid-collection  -NPO, continue aggressive IVF hydration, bowel-rest, IV analgesics, at risk for complications.  counseled to stop drinking etoh.   Alcoholism. Denies withdrawals. Monitor with CIWA with prn Ativan. Counseled to stop etoh use  Hyponatremia. Hypokalemia. in setting of hypovolemia. Continue IVF with NS, replace lytes. repeat chem panel in am   Leukocytosis, thrombocytosis.  Mild leukocytosis and thrombocytosis on admission, likely reactive. Repeat CBC in am     Code Status: full Family Communication: d/w patient, RN (indicate person spoken with, relationship, and if by phone, the number) Disposition Plan: home when stable    Consultants:  none  Procedures:  none  Antibiotics:  noen (indicate start date, and stop date if known)  HPI/Subjective: Alert, reports abdominal pains. vomited last night   Objective: Vitals:   01/06/18 0250 01/06/18 0500  BP: (!) 156/85 139/75  Pulse: (!) 103 (!) 115  Resp: 16 16  Temp: 98.1 F (36.7 C) 98.4 F (36.9 C)  SpO2: 100% 100%    Intake/Output Summary (Last 24 hours) at 01/06/2018 1359 Last data filed at 01/06/2018 0300 Gross per 24 hour  Intake 1225 ml  Output -  Net 1225 ml   Filed Weights   01/05/18 2052 01/06/18 0250  Weight: 61.2 kg (135 lb) 60.3 kg (133 lb)    Exam:   General:  alert  Cardiovascular: s1.s2 rrr  Respiratory: CTA BL  Abdomen: soft, epigastric tenderness.   Musculoskeletal: no leg edema    Data Reviewed: Basic Metabolic Panel: Recent Labs  Lab 01/05/18 2050 01/06/18 0218  NA 134* 136  K 3.7 3.2*  CL 92* 101  CO2 25 20*  GLUCOSE 143* 102*  BUN 7 5*  CREATININE 0.97 0.84  CALCIUM 9.9 8.3*  MG  --  1.8   Liver Function Tests: Recent Labs  Lab 01/05/18 2050 01/06/18 0218  AST 65* 44*  ALT 47 37  ALKPHOS 127* 104  BILITOT 1.0 1.0  PROT 8.6* 6.9  ALBUMIN 4.4 3.5   Recent Labs  Lab 01/05/18 2050 01/06/18 0218  LIPASE 306* 363*   No results for input(s): AMMONIA in the last 168 hours. CBC: Recent Labs  Lab 01/05/18 2050 01/06/18 0218  WBC 11.1* 12.3*  NEUTROABS  --   9.3*  HGB 16.6 14.9  HCT 47.5 44.2  MCV 86.7 86.0  PLT 405* 325   Cardiac Enzymes: No results for input(s): CKTOTAL, CKMB, CKMBINDEX, TROPONINI in the last 168 hours. BNP (last 3 results) No results for input(s): BNP in the last 8760 hours.  ProBNP (last 3 results) No results for input(s): PROBNP in the last 8760 hours.  CBG: Recent Labs  Lab 01/06/18 0807  GLUCAP 82    No results found for this or any previous visit (from the past 240 hour(s)).   Studies: Ct Abdomen Pelvis W Contrast  Result Date: 01/05/2018 CLINICAL DATA:  Abdominal pain radiating to the back for 3 days. Nausea and vomiting. Pancreatitis, acute. EXAM: CT ABDOMEN AND PELVIS WITH CONTRAST TECHNIQUE: Multidetector CT imaging of the abdomen and pelvis was performed using the standard protocol following bolus administration of intravenous contrast. CONTRAST:  111mL ISOVUE-300 IOPAMIDOL (ISOVUE-300) INJECTION 61% COMPARISON:  None. FINDINGS: Lower chest: No consolidation or pleural fluid. Hepatobiliary: Enhancing 10 mm focus in the left lobe of the liver. Gallbladder physiologically distended, no calcified stone. No biliary dilatation. Pancreas: Moderate peripancreatic fat stranding about the entire pancreas, most prominent about the head and uncinate process. Ill-defined developing fluid collection in the uncinate process measures 14 x 9 mm (best appreciated on delayed phase image 14 series 8), with adjacent ill-defined low-density. Minimal low-density in the uncinate process and pancreatic head. Small amount of peripancreatic free fluid and fluid in the lesser sac. No ductal dilatation. No pancreatic calcifications. Spleen: Normal in size without focal abnormality. Adrenals/Urinary Tract: Normal adrenal glands. No hydronephrosis or perinephric edema. Homogeneous renal enhancement with symmetric excretion on delayed phase imaging. Urinary bladder is physiologically distended without wall thickening. Stomach/Bowel: Stomach  physiologically distended. Mild wall thickening of the duodenum is likely reactive. Prominent fluid-filled distal small bowel without obstruction. Mild wall thickening of the hepatic flexure of the colon in the region of pancreatic inflammation. Scattered colonic diverticula involving the ascending and descending colon without diverticulitis. Normal appendix. Vascular/Lymphatic: Aorto bi-iliac atherosclerosis, moderate. No aneurysm. Splenic and portal veins are patent. No enlarged abdominal or pelvic lymph nodes. Reproductive: Prostate is unremarkable. Round soft tissue density in the right inguinal canal, presumed inguinal testis. Other: Peripancreatic and retroperitoneal stranding and ill-defined fluid. Edema tracks in both pericolic gutters. No free air. Musculoskeletal: Avascular necrosis of bilateral femoral heads without subchondral collapse. Mild degenerative change in the spine. IMPRESSION: 1. Acute pancreatitis with moderate diffuse peripancreatic edema. Low-density in the pancreatic head and uncinate process favored to be pancreatic edema rather than pancreatic necrosis. Small developing ill-defined fluid collection in the uncinate process measuring 14 x 9 mm. 2. Enhancing 10 mm focus in the left lobe of the liver is likely a flash filling hemangioma or arterial portal shunt. 3. Right inguinal testis, may be transient, recommend correlation with physical exam. 4. Avascular necrosis of bilateral femoral heads without subchondral collapse. 5. Moderate Aortic Atherosclerosis (ICD10-I70.0). Electronically Signed   By: Jeb Levering M.D.   On: 01/05/2018 23:32    Scheduled Meds: . enoxaparin (LOVENOX) injection  40  mg Subcutaneous Q24H  . folic acid  1 mg Oral Daily  . LORazepam  0-4 mg Intravenous Q6H   Followed by  . [START ON 01/08/2018] LORazepam  0-4 mg Intravenous Q12H  . multivitamin with minerals  1 tablet Oral Daily  . thiamine  100 mg Oral Daily   Or  . thiamine  100 mg Intravenous Daily    Continuous Infusions: . famotidine (PEPCID) IV Stopped (01/06/18 1117)    Principal Problem:   Acute alcoholic pancreatitis Active Problems:   Hyponatremia   Thrombocytosis (HCC)    Time spent: >35 minutes     Kinnie Feil  [facility] Pager [phone]. If 7PM-7AM, please contact night-coverage at [email or web address], password [facility] 01/06/2018, 1:59 PM  LOS: 0 days

## 2018-01-07 ENCOUNTER — Other Ambulatory Visit: Payer: Self-pay

## 2018-01-07 LAB — COMPREHENSIVE METABOLIC PANEL
ALBUMIN: 3.1 g/dL — AB (ref 3.5–5.0)
ALK PHOS: 81 U/L (ref 38–126)
ALT: 29 U/L (ref 17–63)
ANION GAP: 10 (ref 5–15)
AST: 45 U/L — ABNORMAL HIGH (ref 15–41)
BUN: <5 mg/dL — ABNORMAL LOW (ref 6–20)
CALCIUM: 9.2 mg/dL (ref 8.9–10.3)
CO2: 26 mmol/L (ref 22–32)
CREATININE: 0.93 mg/dL (ref 0.61–1.24)
Chloride: 100 mmol/L — ABNORMAL LOW (ref 101–111)
GFR calc Af Amer: >60 mL/min (ref >60)
GFR calc non Af Amer: >60 mL/min (ref >60)
GLUCOSE: 82 mg/dL (ref 65–99)
Potassium: 4.7 mmol/L (ref 3.5–5.1)
Sodium: 136 mmol/L (ref 135–145)
Total Bilirubin: 0.9 mg/dL (ref 0.3–1.2)
Total Protein: 6.7 g/dL (ref 6.5–8.1)

## 2018-01-07 LAB — GLUCOSE, CAPILLARY: Glucose-Capillary: 76 mg/dL (ref 65–99)

## 2018-01-07 LAB — CBC
HEMATOCRIT: 42.9 % (ref 39.0–52.0)
HEMOGLOBIN: 14.4 g/dL (ref 13.0–17.0)
MCH: 29.8 pg (ref 26.0–34.0)
MCHC: 33.6 g/dL (ref 30.0–36.0)
MCV: 88.8 fL (ref 78.0–100.0)
Platelets: 301 10*3/uL (ref 150–400)
RBC: 4.83 MIL/uL (ref 4.22–5.81)
RDW: 14 % (ref 11.5–15.5)
WBC: 10.3 10*3/uL (ref 4.0–10.5)

## 2018-01-07 MED ORDER — BOOST / RESOURCE BREEZE PO LIQD CUSTOM
1.0000 | Freq: Three times a day (TID) | ORAL | Status: DC
  Administered 2018-01-08 – 2018-01-09 (×4): 1 via ORAL

## 2018-01-07 MED ORDER — KCL-LACTATED RINGERS 20 MEQ/L IV SOLN
INTRAVENOUS | Status: DC
  Administered 2018-01-07 – 2018-01-08 (×2): via INTRAVENOUS
  Filled 2018-01-07 (×4): qty 1000

## 2018-01-07 NOTE — Progress Notes (Signed)
[facility] PROGRESS NOTE  Stephen [name] LKG:[id number] DOB: February 06, 1961 DOA: 01/05/2018 PCP: [facility], [place]  Brief summary   57 y.o. male with medical history significant for alcoholism and history of pancreatitis, now presenting to the emergency department for evaluation of severe abdominal pain with nausea and nonbloody vomiting.  Patient reports that he had been in his usual state of health until approximately 3 days ago when he pain in the abdomen.  Pain is generalized, constant, most severe in the upper abdomen, associated with nausea, and couple episodes of nonbloody vomiting.  He describes his symptoms as very similar to this with pancreatitis.  He continues to drink beer daily.  Denies fevers, chills, chest pain, or shortness of breath.  ED Course: Upon arrival to the ED, patient is found to be afebrile, saturating well on room air, tachycardic to 120, and hypertensive.  Chemistry panel is notable for a slight hyponatremia and lipase of 306.  CBC features a leukocytosis to 11,100 and a thrombocytosis with platelets of 405,000.  CT of the abdomen and pelvis reveals moderate diffuse peripancreatic edema and developing ill-defined fluid collection consistent with acute pancreatitis.  He was treated with 2 L of normal saline and 1 mg of IV Dilaudid in the ED.  Tachycardia has improved, blood pressure remained stable, there is no apparent respiratory distress, patient will be admitted to the medical-surgical unit for ongoing evaluation and management of acute alcoholic pancreatitis.   Assessment/Plan:  Acute alcoholic pancreatitis. Presents with 3 days of severe abdominal pain, nausea, and occasional non-bloody vomiting; described as similar to prior bouts of acute pancreatitis. Lipase is elevated and CT findings consistent with acute pancreatitis with developing fluid-collection  -some improvement after npo, IVF hydration, bowel-rest, IV analgesics. Will try clears today.  at  risk for complications. counseled to stop drinking etoh.   Alcoholism. Denies withdrawals. Monitor with CIWA with prn Ativan. Counseled to stop etoh use  Hyponatremia. Hypokalemia. in setting of hypovolemia. Resolved  IVF with NS, replaced lytes  Leukocytosis, thrombocytosis.  Mild leukocytosis and thrombocytosis on admission, likely reactive. Afebrile. Resolved. Monitor     Code Status: full Family Communication: d/w patient, RN (indicate person spoken with, relationship, and if by phone, the number) Disposition Plan: home when stable    Consultants:  none  Procedures:  none  Antibiotics:  noen (indicate start date, and stop date if known)  HPI/Subjective: Alert, reports feeling better, no vomiting. Wants to try something to eat   Objective: Vitals:   01/06/18 2132 01/07/18 0449  BP: (!) 146/64 140/86  Pulse: (!) 106 97  Resp: 16 16  Temp: 98.6 F (37 C) 98.9 F (37.2 C)  SpO2: 100% 98%    Intake/Output Summary (Last 24 hours) at 01/07/2018 1057 Last data filed at 01/07/2018 0946 Gross per 24 hour  Intake 850 ml  Output 1275 ml  Net -425 ml   Filed Weights   01/05/18 2052 01/06/18 0250  Weight: 61.2 kg (135 lb) 60.3 kg (133 lb)    Exam:   General:  alert  Cardiovascular: s1.s2 rrr  Respiratory: CTA BL  Abdomen: soft, epigastric tenderness.   Musculoskeletal: no leg edema    Data Reviewed: Basic Metabolic Panel: Recent Labs  Lab 01/05/18 2050 01/06/18 0218 01/07/18 0616  NA 134* 136 136  K 3.7 3.2* 4.7  CL 92* 101 100*  CO2 25 20* 26  GLUCOSE 143* 102* 82  BUN 7 5* <5*  CREATININE 0.97 0.84 0.93  CALCIUM 9.9 8.3*  9.2  MG  --  1.8  --    Liver Function Tests: Recent Labs  Lab 01/05/18 2050 01/06/18 0218 01/07/18 0616  AST 65* 44* 45*  ALT 47 37 29  ALKPHOS 127* 104 81  BILITOT 1.0 1.0 0.9  PROT 8.6* 6.9 6.7  ALBUMIN 4.4 3.5 3.1*   Recent Labs  Lab 01/05/18 2050 01/06/18 0218  LIPASE 306* 363*   No results for  input(s): AMMONIA in the last 168 hours. CBC: Recent Labs  Lab 01/05/18 2050 01/06/18 0218 01/07/18 0616  WBC 11.1* 12.3* 10.3  NEUTROABS  --  9.3*  --   HGB 16.6 14.9 14.4  HCT 47.5 44.2 42.9  MCV 86.7 86.0 88.8  PLT 405* 325 301   Cardiac Enzymes: No results for input(s): CKTOTAL, CKMB, CKMBINDEX, TROPONINI in the last 168 hours. BNP (last 3 results) No results for input(s): BNP in the last 8760 hours.  ProBNP (last 3 results) No results for input(s): PROBNP in the last 8760 hours.  CBG: Recent Labs  Lab 01/06/18 0807 01/07/18 0805  GLUCAP 82 76    No results found for this or any previous visit (from the past 240 hour(s)).   Studies: Ct Abdomen Pelvis W Contrast  Result Date: 01/05/2018 CLINICAL DATA:  Abdominal pain radiating to the back for 3 days. Nausea and vomiting. Pancreatitis, acute. EXAM: CT ABDOMEN AND PELVIS WITH CONTRAST TECHNIQUE: Multidetector CT imaging of the abdomen and pelvis was performed using the standard protocol following bolus administration of intravenous contrast. CONTRAST:  155mL ISOVUE-300 IOPAMIDOL (ISOVUE-300) INJECTION 61% COMPARISON:  None. FINDINGS: Lower chest: No consolidation or pleural fluid. Hepatobiliary: Enhancing 10 mm focus in the left lobe of the liver. Gallbladder physiologically distended, no calcified stone. No biliary dilatation. Pancreas: Moderate peripancreatic fat stranding about the entire pancreas, most prominent about the head and uncinate process. Ill-defined developing fluid collection in the uncinate process measures 14 x 9 mm (best appreciated on delayed phase image 14 series 8), with adjacent ill-defined low-density. Minimal low-density in the uncinate process and pancreatic head. Small amount of peripancreatic free fluid and fluid in the lesser sac. No ductal dilatation. No pancreatic calcifications. Spleen: Normal in size without focal abnormality. Adrenals/Urinary Tract: Normal adrenal glands. No hydronephrosis or  perinephric edema. Homogeneous renal enhancement with symmetric excretion on delayed phase imaging. Urinary bladder is physiologically distended without wall thickening. Stomach/Bowel: Stomach physiologically distended. Mild wall thickening of the duodenum is likely reactive. Prominent fluid-filled distal small bowel without obstruction. Mild wall thickening of the hepatic flexure of the colon in the region of pancreatic inflammation. Scattered colonic diverticula involving the ascending and descending colon without diverticulitis. Normal appendix. Vascular/Lymphatic: Aorto bi-iliac atherosclerosis, moderate. No aneurysm. Splenic and portal veins are patent. No enlarged abdominal or pelvic lymph nodes. Reproductive: Prostate is unremarkable. Round soft tissue density in the right inguinal canal, presumed inguinal testis. Other: Peripancreatic and retroperitoneal stranding and ill-defined fluid. Edema tracks in both pericolic gutters. No free air. Musculoskeletal: Avascular necrosis of bilateral femoral heads without subchondral collapse. Mild degenerative change in the spine. IMPRESSION: 1. Acute pancreatitis with moderate diffuse peripancreatic edema. Low-density in the pancreatic head and uncinate process favored to be pancreatic edema rather than pancreatic necrosis. Small developing ill-defined fluid collection in the uncinate process measuring 14 x 9 mm. 2. Enhancing 10 mm focus in the left lobe of the liver is likely a flash filling hemangioma or arterial portal shunt. 3. Right inguinal testis, may be transient, recommend correlation with physical exam. 4.  Avascular necrosis of bilateral femoral heads without subchondral collapse. 5. Moderate Aortic Atherosclerosis (ICD10-I70.0). Electronically Signed   By: Jeb Levering M.D.   On: 01/05/2018 23:32    Scheduled Meds: . enoxaparin (LOVENOX) injection  40 mg Subcutaneous Q24H  . folic acid  1 mg Oral Daily  . LORazepam  0-4 mg Intravenous Q6H    Followed by  . [START ON 01/08/2018] LORazepam  0-4 mg Intravenous Q12H  . multivitamin with minerals  1 tablet Oral Daily  . thiamine  100 mg Oral Daily   Or  . thiamine  100 mg Intravenous Daily   Continuous Infusions: . famotidine (PEPCID) IV Stopped (01/07/18 0935)  . lactated ringers with KCl 20 mEq/L 150 mL/hr at 01/07/18 0710    Principal Problem:   Acute alcoholic pancreatitis Active Problems:   Hyponatremia   Thrombocytosis (HCC)    Time spent: >35 minutes     Kinnie Feil  [facility] Pager [phone]. If 7PM-7AM, please contact night-coverage at [email or web address], password [facility] 01/07/2018, 10:57 AM  LOS: 1 day

## 2018-01-08 LAB — LIPASE, BLOOD: Lipase: 102 U/L — ABNORMAL HIGH (ref 11–51)

## 2018-01-08 LAB — GLUCOSE, CAPILLARY: GLUCOSE-CAPILLARY: 84 mg/dL (ref 65–99)

## 2018-01-08 MED ORDER — OXYCODONE HCL ER 15 MG PO T12A
15.0000 mg | EXTENDED_RELEASE_TABLET | Freq: Two times a day (BID) | ORAL | Status: DC
  Administered 2018-01-08 – 2018-01-09 (×3): 15 mg via ORAL
  Filled 2018-01-08 (×3): qty 1

## 2018-01-08 MED ORDER — SODIUM CHLORIDE 0.9 % IV SOLN
INTRAVENOUS | Status: DC
  Administered 2018-01-08 – 2018-01-09 (×4): via INTRAVENOUS

## 2018-01-08 NOTE — Progress Notes (Signed)
Initial Nutrition Assessment  DOCUMENTATION CODES:   Non-severe (moderate) malnutrition in context of chronic illness, Underweight  INTERVENTION:   -Continue Boost Breeze po TID, each supplement provides 250 kcal and 9 grams of protein -Continue MVI daily  NUTRITION DIAGNOSIS:   Moderate Malnutrition related to chronic illness(ETOH abuse and pancreatitis) as evidenced by mild fat depletion, mild muscle depletion, moderate muscle depletion.  GOAL:   Patient will meet greater than or equal to 90% of their needs  MONITOR:   PO intake, Supplement acceptance, Diet advancement, Labs, Weight trends, Skin, I & O's  REASON FOR ASSESSMENT:   Malnutrition Screening Tool    ASSESSMENT:   Stephen [name] is a 57 y.o. male with medical history significant for alcoholism and history of pancreatitis, now presenting to the emergency department for evaluation of severe abdominal pain with nausea and nonbloody vomiting.   Pt admitted with acute alcoholic pancreatitis.   Pt just advanced to clear liquids this morning.   Spoke with pt at bedside, who reports he is tolerating clear liquids well. Per pt, he has experienced decreased intake over the past week due to stomach pain. He typically consumes 3-4 meals per day (consists of items such as pork chop, broccoli, and potatoes or beans and rice). Pt reports consuming mostly water and juice, but also consumes 3-4 beers daily. He consumes approximately 12 Ensure supplements per month.  Pt reports UBW of 140#. He shares he lost about 35# when he fractured his jaw in the 1990s and was unable to regain lost weight. Pt reports UBW is around 112#.   Discussed with pt rationale for clear liquid diet. Pt eager for diet advancement, but enjoys Boost Breeze supplements. He is amenable to Ensure with diet advancement.   Medications reviewed and include folic acid, MVI, and thiamine.   Labs reviewed.   NUTRITION - FOCUSED PHYSICAL EXAM:    Most Recent  Value  Orbital Region  Mild depletion  Upper Arm Region  Mild depletion  Thoracic and Lumbar Region  Mild depletion  Buccal Region  No depletion  Temple Region  Mild depletion  Clavicle Bone Region  Mild depletion  Clavicle and Acromion Bone Region  Mild depletion  Scapular Bone Region  Mild depletion  Dorsal Hand  Moderate depletion  Patellar Region  Moderate depletion  Anterior Thigh Region  Moderate depletion  Posterior Calf Region  Moderate depletion  Edema (RD Assessment)  None  Hair  Reviewed  Eyes  Reviewed  Mouth  Reviewed  Skin  Reviewed  Nails  Reviewed       Diet Order:  Diet clear liquid Room service appropriate? Yes; Fluid consistency: Thin  EDUCATION NEEDS:   Education needs have been addressed  Skin:  Skin Assessment: Reviewed RN Assessment  Last BM:  01/05/18  Height:   Ht Readings from Last 1 Encounters:  01/06/18 5\' 7"  (1.702 m)    Weight:   Wt Readings from Last 1 Encounters:  01/08/18 111 lb 8.8 oz (50.6 kg)    Ideal Body Weight:  67.3 kg  BMI:  Body mass index is 17.47 kg/m.  Estimated Nutritional Needs:   Kcal:  1600-1800  Protein:  85-100 grams  Fluid:  1.6-1.8 L    Makalah Asberry A. Jimmye Norman, RD, LDN, CDE Pager: [phone] After hours Pager: [phone]

## 2018-01-08 NOTE — Progress Notes (Signed)
[facility] PROGRESS NOTE  Stephen [name] VEL:[id number] DOB: 1961/05/04 DOA: 01/05/2018 PCP: [facility], [place]  Brief summary   57 y.o. male with medical history significant for alcoholism and history of pancreatitis, now presenting to the emergency department for evaluation of severe abdominal pain with nausea and nonbloody vomiting.  Chemistry panel is notable for a slight hyponatremia and lipase of 306.  CBC features a leukocytosis to 11,100 and a thrombocytosis with platelets of 405,000.  CT of the abdomen and pelvis reveals moderate diffuse peripancreatic edema and developing ill-defined fluid collection consistent with acute pancreatitis.   admitted to the medical-surgical unit for ongoing evaluation and management of acute alcoholic pancreatitis.   Assessment/Plan:  Acute alcoholic pancreatitis. Presents with 3 days of severe abdominal pain, nausea, and occasional non-bloody vomiting; described as similar to prior bouts of acute pancreatitis. Lipase was trending up as of yesterday, and CT findings consistent with acute pancreatitis with developing fluid-collection . Started on clear liquids 3/3, according to RN continues to have severe abdominal pain requesting Dilaudid every 3 hours, will continue patient on clear liquids for now, continue IVF hydration, bowel-rest, IV analgesics. Add OxyContin for better for pain control.    Alcoholism. Denies withdrawals. Monitor with CIWA with prn Ativan. Counseled to stop etoh use, last drink was according to the patient 1-2 weeks ago.  Hyponatremia. Hypokalemia. in setting of hypovolemia. Resolved  with IVF   Leukocytosis, thrombocytosis.  Mild leukocytosis and thrombocytosis on admission, likely reactive. Afebrile. Resolved. Monitor     Code Status: full Family Communication: d/w patient, RN (indicate person spoken with, relationship, and if by phone, the number) Disposition Plan: home  likely 1-2  days   Consultants:  none  Procedures:  none  Antibiotics:  noen (indicate start date, and stop date if known)  HPI/Subjective:  Patient continues to endorse left upper quadrant and epigastric pain, mild nausea, pain poorly controlled  Objective: Vitals:   01/07/18 2038 01/08/18 0426  BP: 139/82 137/85  Pulse: 84 85  Resp: 16 16  Temp: 97.7 F (36.5 C) 97.7 F (36.5 C)  SpO2: 100% 100%    Intake/Output Summary (Last 24 hours) at 01/08/2018 1120 Last data filed at 01/08/2018 1057 Gross per 24 hour  Intake 2044.58 ml  Output 3200 ml  Net -1155.42 ml   Filed Weights   01/05/18 2052 01/06/18 0250 01/08/18 0429  Weight: 61.2 kg (135 lb) 60.3 kg (133 lb) 50.6 kg (111 lb 8.8 oz)    Exam:   General:  alert  Cardiovascular: s1.s2 rrr  Respiratory: CTA BL  Abdomen: soft, epigastric tenderness.   Musculoskeletal: no leg edema    Data Reviewed: Basic Metabolic Panel: Recent Labs  Lab 01/05/18 2050 01/06/18 0218 01/07/18 0616  NA 134* 136 136  K 3.7 3.2* 4.7  CL 92* 101 100*  CO2 25 20* 26  GLUCOSE 143* 102* 82  BUN 7 5* <5*  CREATININE 0.97 0.84 0.93  CALCIUM 9.9 8.3* 9.2  MG  --  1.8  --    Liver Function Tests: Recent Labs  Lab 01/05/18 2050 01/06/18 0218 01/07/18 0616  AST 65* 44* 45*  ALT 47 37 29  ALKPHOS 127* 104 81  BILITOT 1.0 1.0 0.9  PROT 8.6* 6.9 6.7  ALBUMIN 4.4 3.5 3.1*   Recent Labs  Lab 01/05/18 2050 01/06/18 0218  LIPASE 306* 363*   No results for input(s): AMMONIA in the last 168 hours. CBC: Recent Labs  Lab 01/05/18 2050 01/06/18 0218 01/07/18  0616  WBC 11.1* 12.3* 10.3  NEUTROABS  --  9.3*  --   HGB 16.6 14.9 14.4  HCT 47.5 44.2 42.9  MCV 86.7 86.0 88.8  PLT 405* 325 301   Cardiac Enzymes: No results for input(s): CKTOTAL, CKMB, CKMBINDEX, TROPONINI in the last 168 hours. BNP (last 3 results) No results for input(s): BNP in the last 8760 hours.  ProBNP (last 3 results) No results for input(s): PROBNP in  the last 8760 hours.  CBG: Recent Labs  Lab 01/06/18 0807 01/07/18 0805 01/08/18 0809  GLUCAP 82 76 84    No results found for this or any previous visit (from the past 240 hour(s)).   Studies: No results found.  Scheduled Meds: . enoxaparin (LOVENOX) injection  40 mg Subcutaneous Q24H  . feeding supplement  1 Container Oral TID BM  . folic acid  1 mg Oral Daily  . LORazepam  0-4 mg Intravenous Q12H  . multivitamin with minerals  1 tablet Oral Daily  . thiamine  100 mg Oral Daily   Or  . thiamine  100 mg Intravenous Daily   Continuous Infusions: . sodium chloride 125 mL/hr at 01/08/18 0930  . famotidine (PEPCID) IV Stopped (01/08/18 0959)    Principal Problem:   Acute alcoholic pancreatitis Active Problems:   Hyponatremia   Thrombocytosis ([place])    Time spent: >35 minutes     Stephen [name]  [facility] Pager [phone]. If 7PM-7AM, please contact night-coverage at [email or web address], password [email or web address] 01/08/2018, 11:20 AM  LOS: 2 days

## 2018-01-09 DIAGNOSIS — E440 Moderate protein-calorie malnutrition: Secondary | ICD-10-CM

## 2018-01-09 DIAGNOSIS — K852 Alcohol induced acute pancreatitis without necrosis or infection: Principal | ICD-10-CM

## 2018-01-09 LAB — BASIC METABOLIC PANEL
Anion gap: 8 (ref 5–15)
CALCIUM: 9.1 mg/dL (ref 8.9–10.3)
CO2: 28 mmol/L (ref 22–32)
Chloride: 104 mmol/L (ref 101–111)
Creatinine, Ser: 0.78 mg/dL (ref 0.61–1.24)
GFR calc non Af Amer: >60 mL/min (ref >60)
GLUCOSE: 129 mg/dL — AB (ref 65–99)
Potassium: 3.4 mmol/L — ABNORMAL LOW (ref 3.5–5.1)
Sodium: 140 mmol/L (ref 135–145)

## 2018-01-09 LAB — GLUCOSE, CAPILLARY
Glucose-Capillary: 91 mg/dL (ref 65–99)
Glucose-Capillary: 66 mg/dL (ref 65–99)

## 2018-01-09 MED ORDER — NAPROXEN 500 MG PO TABS: 500.0000 mg | ORAL_TABLET | Freq: Two times a day (BID) | ORAL | 0 refills | Status: DC

## 2018-01-09 MED ORDER — POTASSIUM CHLORIDE 10 MEQ/100ML IV SOLN
10.0000 meq | INTRAVENOUS | Status: AC
  Administered 2018-01-09 (×4): 10 meq via INTRAVENOUS
  Filled 2018-01-09 (×4): qty 100

## 2018-01-09 NOTE — Discharge Summary (Signed)
Physician Discharge Summary  Stephen [name] RCV:[id number] DOB: [birth year] DOA: 01/05/2018  PCP: [facility], [place] date: 01/05/2018 Discharge date: 01/09/2018  Admitted From: Home Disposition: Home  Recommendations for Outpatient Follow-up:  1. Follow up with PCP in 1-2 weeks 2. Please obtain BMP/CBC in one week   Home Health: No Equipment/Devices: None  Discharge Condition: Stable CODE STATUS: Full Diet recommendation: Regular  Brief/Interim Summary:  #) Acute alcoholic pancreatitis: Patient was admitted with acute alcoholic pancreatitis.  CT scan showed possible developing pseudocyst.  Patient was given IV fluids and his diet was advanced slowly until he could tolerate p.o.  Patient was given IV and p.o. pain medication and medication for nausea and vomiting.  Patient was discharged home and strictly told to stop drinking.  #) Alcohol withdrawal: Patient was admitted with last drink several days ago.  Patient was placed on alcohol withdrawal pathway.  Patient did not require significant amounts of Ativan for alcohol withdrawal.  Counseling was given and patient was not interested in trialing medication for alcoholism at this time.  Discharge Diagnoses:  Principal Problem:   Acute alcoholic pancreatitis Active Problems:   Malnutrition of moderate degree    Discharge Instructions  Discharge Instructions    Call MD for:  persistant nausea and vomiting   Complete by:  As directed    Call MD for:  severe uncontrolled pain   Complete by:  As directed    Call MD for:  temperature >100.4   Complete by:  As directed    Diet - low sodium heart healthy   Complete by:  As directed    Discharge instructions   Complete by:  As directed    Please follow-up with your primary care doctor in 1-2 weeks.   Increase activity slowly   Complete by:  As directed      Allergies as of 01/09/2018   No Known Allergies     Medication List    TAKE these medications    ondansetron 4 MG tablet Commonly known as:  ZOFRAN Take 1 tablet (4 mg total) by mouth every 6 (six) hours as needed for nausea or vomiting.   oxyCODONE-acetaminophen 5-325 MG tablet Commonly known as:  PERCOCET Take 1 tablet by mouth every 4 (four) hours as needed for moderate pain.       No Known Allergies  Consultations:  None   Procedures/Studies: Ct Abdomen Pelvis W Contrast  Result Date: 01/05/2018 CLINICAL DATA:  Abdominal pain radiating to the back for 3 days. Nausea and vomiting. Pancreatitis, acute. EXAM: CT ABDOMEN AND PELVIS WITH CONTRAST TECHNIQUE: Multidetector CT imaging of the abdomen and pelvis was performed using the standard protocol following bolus administration of intravenous contrast. CONTRAST:  137mL ISOVUE-300 IOPAMIDOL (ISOVUE-300) INJECTION 61% COMPARISON:  None. FINDINGS: Lower chest: No consolidation or pleural fluid. Hepatobiliary: Enhancing 10 mm focus in the left lobe of the liver. Gallbladder physiologically distended, no calcified stone. No biliary dilatation. Pancreas: Moderate peripancreatic fat stranding about the entire pancreas, most prominent about the head and uncinate process. Ill-defined developing fluid collection in the uncinate process measures 14 x 9 mm (best appreciated on delayed phase image 14 series 8), with adjacent ill-defined low-density. Minimal low-density in the uncinate process and pancreatic head. Small amount of peripancreatic free fluid and fluid in the lesser sac. No ductal dilatation. No pancreatic calcifications. Spleen: Normal in size without focal abnormality. Adrenals/Urinary Tract: Normal adrenal glands. No hydronephrosis or perinephric edema. Homogeneous renal enhancement with symmetric excretion on  delayed phase imaging. Urinary bladder is physiologically distended without wall thickening. Stomach/Bowel: Stomach physiologically distended. Mild wall thickening of the duodenum is likely reactive. Prominent fluid-filled distal  small bowel without obstruction. Mild wall thickening of the hepatic flexure of the colon in the region of pancreatic inflammation. Scattered colonic diverticula involving the ascending and descending colon without diverticulitis. Normal appendix. Vascular/Lymphatic: Aorto bi-iliac atherosclerosis, moderate. No aneurysm. Splenic and portal veins are patent. No enlarged abdominal or pelvic lymph nodes. Reproductive: Prostate is unremarkable. Round soft tissue density in the right inguinal canal, presumed inguinal testis. Other: Peripancreatic and retroperitoneal stranding and ill-defined fluid. Edema tracks in both pericolic gutters. No free air. Musculoskeletal: Avascular necrosis of bilateral femoral heads without subchondral collapse. Mild degenerative change in the spine. IMPRESSION: 1. Acute pancreatitis with moderate diffuse peripancreatic edema. Low-density in the pancreatic head and uncinate process favored to be pancreatic edema rather than pancreatic necrosis. Small developing ill-defined fluid collection in the uncinate process measuring 14 x 9 mm. 2. Enhancing 10 mm focus in the left lobe of the liver is likely a flash filling hemangioma or arterial portal shunt. 3. Right inguinal testis, may be transient, recommend correlation with physical exam. 4. Avascular necrosis of bilateral femoral heads without subchondral collapse. 5. Moderate Aortic Atherosclerosis (ICD10-I70.0). Electronically Signed   By: Jeb Levering M.D.   On: 01/05/2018 23:32       Subjective:   Discharge Exam: Vitals:   01/08/18 2117 01/09/18 0504  BP: (!) 168/87 131/85  Pulse: 83 90  Resp: 18 18  Temp: 98.4 F (36.9 C) 98.1 F (36.7 C)  SpO2: 100% 100%   Vitals:   01/08/18 0429 01/08/18 1343 01/08/18 2117 01/09/18 0504  BP:  (!) 161/84 (!) 168/87 131/85  Pulse:  91 83 90  Resp:  18 18 18   Temp:  98.4 F (36.9 C) 98.4 F (36.9 C) 98.1 F (36.7 C)  TempSrc:  Oral Oral Oral  SpO2:  100% 100% 100%  Weight:  50.6 kg (111 lb 8.8 oz)     Height:        General: Pt is alert, awake, not in acute distress Cardiovascular: RRR, S1/S2 +, no rubs, no gallops Respiratory: CTA bilaterally, no wheezing, no rhonchi Abdominal: Soft, NT, ND, bowel sounds + Extremities: no edema    The results of significant diagnostics from this hospitalization (including imaging, microbiology, ancillary and laboratory) are listed below for reference.     Microbiology: No results found for this or any previous visit (from the past 240 hour(s)).   Labs: BNP (last 3 results) No results for input(s): BNP in the last 8760 hours. Basic Metabolic Panel: Recent Labs  Lab 01/05/18 2050 01/06/18 0218 01/07/18 0616 01/09/18 0625  NA 134* 136 136 140  K 3.7 3.2* 4.7 3.4*  CL 92* 101 100* 104  CO2 25 20* 26 28  GLUCOSE 143* 102* 82 129*  BUN 7 5* <5* <5*  CREATININE 0.97 0.84 0.93 0.78  CALCIUM 9.9 8.3* 9.2 9.1  MG  --  1.8  --   --    Liver Function Tests: Recent Labs  Lab 01/05/18 2050 01/06/18 0218 01/07/18 0616  AST 65* 44* 45*  ALT 47 37 29  ALKPHOS 127* 104 81  BILITOT 1.0 1.0 0.9  PROT 8.6* 6.9 6.7  ALBUMIN 4.4 3.5 3.1*   Recent Labs  Lab 01/05/18 2050 01/06/18 0218 01/08/18 1141  LIPASE 306* 363* 102*   No results for input(s): AMMONIA in the last 168 hours.  CBC: Recent Labs  Lab 01/05/18 2050 01/06/18 0218 01/07/18 0616  WBC 11.1* 12.3* 10.3  NEUTROABS  --  9.3*  --   HGB 16.6 14.9 14.4  HCT 47.5 44.2 42.9  MCV 86.7 86.0 88.8  PLT 405* 325 301   Cardiac Enzymes: No results for input(s): CKTOTAL, CKMB, CKMBINDEX, TROPONINI in the last 168 hours. BNP: Invalid input(s): POCBNP CBG: Recent Labs  Lab 01/06/18 0807 01/07/18 0805 01/08/18 0809 01/09/18 0501 01/09/18 0809  GLUCAP 82 76 84 91 66   D-Dimer No results for input(s): DDIMER in the last 72 hours. Hgb A1c No results for input(s): HGBA1C in the last 72 hours. Lipid Profile No results for input(s): CHOL, HDL,  LDLCALC, TRIG, CHOLHDL, LDLDIRECT in the last 72 hours. Thyroid function studies No results for input(s): TSH, T4TOTAL, T3FREE, THYROIDAB in the last 72 hours.  Invalid input(s): FREET3 Anemia work up No results for input(s): VITAMINB12, FOLATE, FERRITIN, TIBC, IRON, RETICCTPCT in the last 72 hours. Urinalysis    Component Value Date/Time   COLORURINE YELLOW 01/06/2018 0226   APPEARANCEUR CLEAR 01/06/2018 0226   LABSPEC 1.034 (H) 01/06/2018 0226   PHURINE 6.0 01/06/2018 0226   GLUCOSEU NEGATIVE 01/06/2018 0226   HGBUR MODERATE (A) 01/06/2018 0226   BILIRUBINUR NEGATIVE 01/06/2018 0226   KETONESUR 20 (A) 01/06/2018 0226   PROTEINUR NEGATIVE 01/06/2018 0226   UROBILINOGEN 1.0 04/14/2015 1728   NITRITE NEGATIVE 01/06/2018 0226   LEUKOCYTESUR NEGATIVE 01/06/2018 0226   Sepsis Labs Invalid input(s): PROCALCITONIN,  WBC,  LACTICIDVEN Microbiology No results found for this or any previous visit (from the past 240 hour(s)).   Time coordinating discharge: Over 30 minutes  SIGNED:   Cristy Folks, MD  [facility] 01/09/2018, 11:10 AM   If 7PM-7AM, please contact night-coverage [email or web address] Password [email or web address]

## 2018-01-09 NOTE — Care Management Important Message (Signed)
Important Message  Patient Details  Name: Stephen [name] MRN: [id number] Date of Birth: [birth year]   Medicare Important Message Given:  Yes    Ella Bodo, RN 01/09/2018, 12:14 PM

## 2018-01-09 NOTE — Progress Notes (Signed)
Discharge instructions reviewed with the Pt,  MD notified of prescription needed for pain management, and one received and given to patient. Pt ambulatory - and walked off unit with his brother with his instructions and prescriptions. Pt was instructed to f/u with his PCP, and to avoid drinking alcohol.  Pt advised her will.

## 2018-01-09 NOTE — Discharge Instructions (Signed)

## 2018-01-17 ENCOUNTER — Ambulatory Visit (HOSPITAL_COMMUNITY)
Admission: RE | Admit: 2018-01-17 | Discharge: 2018-01-17 | Disposition: A | Discharge status: Home / Self Care | Payer: Medicare Other | Source: Ambulatory Visit | Attending: Internal Medicine | Admitting: Internal Medicine

## 2018-01-17 ENCOUNTER — Encounter (HOSPITAL_COMMUNITY): Payer: Self-pay

## 2018-01-17 ENCOUNTER — Encounter: Payer: Self-pay | Admitting: Internal Medicine

## 2018-01-17 ENCOUNTER — Ambulatory Visit (INDEPENDENT_AMBULATORY_CARE_PROVIDER_SITE_OTHER): Payer: Medicare Other | Admitting: Internal Medicine

## 2018-01-17 VITALS — BP 132/75 | HR 83 | Temp 98.4°F | Ht 67.0 in | Wt 115.4 lb

## 2018-01-17 DIAGNOSIS — F1010 Alcohol abuse, uncomplicated: Secondary | ICD-10-CM | POA: Diagnosis not present

## 2018-01-17 DIAGNOSIS — M25511 Pain in right shoulder: Principal | ICD-10-CM

## 2018-01-17 DIAGNOSIS — R454 Irritability and anger: Secondary | ICD-10-CM | POA: Diagnosis not present

## 2018-01-17 DIAGNOSIS — K852 Alcohol induced acute pancreatitis without necrosis or infection: Secondary | ICD-10-CM

## 2018-01-17 DIAGNOSIS — R0981 Nasal congestion: Secondary | ICD-10-CM

## 2018-01-17 DIAGNOSIS — Z8719 Personal history of other diseases of the digestive system: Secondary | ICD-10-CM

## 2018-01-17 DIAGNOSIS — F17210 Nicotine dependence, cigarettes, uncomplicated: Secondary | ICD-10-CM | POA: Diagnosis not present

## 2018-01-17 DIAGNOSIS — M19012 Primary osteoarthritis, left shoulder: Secondary | ICD-10-CM | POA: Diagnosis not present

## 2018-01-17 DIAGNOSIS — G8929 Other chronic pain: Secondary | ICD-10-CM | POA: Insufficient documentation

## 2018-01-17 DIAGNOSIS — M25512 Pain in left shoulder: Principal | ICD-10-CM

## 2018-01-17 DIAGNOSIS — F419 Anxiety disorder, unspecified: Secondary | ICD-10-CM | POA: Diagnosis not present

## 2018-01-17 DIAGNOSIS — M87322 Other secondary osteonecrosis, left humerus: Secondary | ICD-10-CM

## 2018-01-17 DIAGNOSIS — Z8611 Personal history of tuberculosis: Secondary | ICD-10-CM

## 2018-01-17 DIAGNOSIS — R768 Other specified abnormal immunological findings in serum: Secondary | ICD-10-CM

## 2018-01-17 DIAGNOSIS — M19011 Primary osteoarthritis, right shoulder: Secondary | ICD-10-CM

## 2018-01-17 DIAGNOSIS — R740 Nonspecific elevation of levels of transaminase and lactic acid dehydrogenase [LDH]: Secondary | ICD-10-CM

## 2018-01-17 DIAGNOSIS — Z1211 Encounter for screening for malignant neoplasm of colon: Secondary | ICD-10-CM

## 2018-01-17 DIAGNOSIS — E875 Hyperkalemia: Secondary | ICD-10-CM | POA: Diagnosis not present

## 2018-01-17 MED ORDER — FEXOFENADINE HCL 180 MG PO TABS: 180.0000 mg | ORAL_TABLET | Freq: Every day | ORAL | 2 refills | Status: DC

## 2018-01-17 MED ORDER — MELOXICAM 15 MG PO TABS: 15.0000 mg | ORAL_TABLET | Freq: Every day | ORAL | 2 refills | Status: DC

## 2018-01-17 NOTE — Assessment & Plan Note (Signed)
He was complaining of nasal discharge and sinus congestion for many years.  He never being treated for that purpose.  May be allergic sinusitis-patient is not aware of any specific allergies.  He was given a prescription of Allegra to see if that will help.

## 2018-01-17 NOTE — Patient Instructions (Addendum)
Thank you for visiting clinic today. We will do x-rays of your shoulders, you can have it done upstairs today or can come another day. We will do some lab work today to check you for hepatitis C. I am also giving you a referral for colonoscopy-they will call you with appointment. I am giving you another pain medicine called Mobic, with you will take it once daily. I am also giving you a medicine called Allegra for your  sinus congestion. I am glad that you are trying to quit back on your alcohol and smoking, keep up the good work, as we discussed I can give you some medicine to help you quit alcohol if needed. Please follow-up in 1 month with your PCP.

## 2018-01-17 NOTE — Assessment & Plan Note (Addendum)
Pancreatitis symptoms has been improved. CT abdomen was positive for a pseudocyst and a small 10 mm enhancing lesion in the liver, interpreted as most likely hemangioma. Also a small soft tissue density noted in the right inguinal canal, most likely testes according to interpretation.  Was counseled again regarding his alcohol abuse history. Patient wants to quit himself.  He will let us know if he need help with naltrexone. We will check hepatitis C antibody. He will need a genitourinary exam during next follow-up visit.  Addendum.  Patient's hepatitis C antibody came back positive called the patient and he will stop by at clinic today while getting his x-ray to draw more blood for quantitative hep C and genotype. We will refer him to ID once those results become available.

## 2018-01-17 NOTE — Progress Notes (Signed)
CC: For his hospital follow-up-admitted for acute alcoholic pancreatitis and to establish care.  HPI:  [name] is a 57 y.o. with past medical history significant for pancreatitis and TB more than 10-year ago, according to patient he was treated for that.  Patient has an history of alcohol abuse disorder, used to drink 6-8 beers per day and had a couple of episodes of pancreatitis.  [facility]. He was recently admitted at [facility] from January 06, 1999 819 till January 09, 2018 due to acute alcoholic pancreatitis and was treated with IV fluid and IV pain medication.  Since discharge his abdominal pain has been improved, he occasionally becomes nauseated, denies any more vomiting, his appetite is improving.  Patient is trying to quit alcohol intake, currently drinking 1 beer every other day, used to drink 6-8 daily.  He is also trying to quit smoking-currently decreased to 2 cigarettes/day, used to smoke 2 pack/day.  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  He was describing his pain as constant, on both shoulders, 6-7/10 in intensity on rest and increased with any movement around shoulder.  He denies any focal deficit.  No tingling or numbness.  He was given naproxen for his pain with no relief.  He was also concerned that he is unable to gain weight despite having a good appetite, but he denies any recent weight loss. He is more prone to constipation, having bowel movement every other day, denies any blood in his stool. He denies any urinary symptoms.  Family history.  Both parents were hypertensive, he is not aware of any other medical conditions in the family.  He does not have any sibling.  Social history.   Lifelong smoker-used to smoke 2 pack/day trying to quit, currently smoking 2 cigarettes/day since his discharge. Trying to decrease his alcohol intake-currently taking one beer every other day-before his current hospitalization he was drinking 6-8 beers per day. Occasionally smoked weed.  Past Medical History:  Diagnosis Date  . Pancreatitis    Review of Systems: Negative except mentioned in HPI.  Physical Exam:  Vitals:   01/17/18 1322  BP: 132/75  Pulse: 83  Temp: 98.4 F (36.9 C)  TempSrc: Oral  SpO2: 100%  Weight: 115 lb 6.4 oz (52.3 kg)  Height: 5\' 7"  (1.702 m)    General: Vital signs reviewed.  Patient is little emaciated, in no acute distress and cooperative with exam.  Head: Normocephalic and atraumatic.  A small freely mobile nodule on right lateral side of his upper cheek.  Hyperemia and mild edema of bilateral nasal turbinate. Eyes: EOMI, conjunctivae normal, no scleral icterus.  Cardiovascular: RRR, S1 normal, S2 normal, no murmurs, gallops, or rubs. Pulmonary/Chest: Clear to auscultation bilaterally, no wheezes, rales, or rhonchi. Abdominal: Soft, non-tender, non-distended, BS +, no masses, organomegaly, or guarding present.  Musculoskeletal: Restricted range of motion at both shoulders due to pain, no focal deficit. Extremities: No lower extremity edema bilaterally,  pulses symmetric and intact bilaterally. No cyanosis or clubbing. Neurological: A&O x3, Strength is normal and symmetric bilaterally, cranial nerve II-XII are grossly intact, no focal motor deficit, sensory intact to light touch bilaterally.  Skin: Warm, dry and intact. No rashes  or erythema. Psychiatric: Anxious and easily irritable.  Assessment & Plan:   See Encounters Tab for problem based charting.  Patient discussed with Dr. Evette Doffing.

## 2018-01-17 NOTE — Assessment & Plan Note (Signed)
Never had any colonoscopy done.  Was given a referral for colonoscopy.

## 2018-01-17 NOTE — Assessment & Plan Note (Addendum)
Shoulder pain most likely is due to overuse, may be resulted in arthritis.  He was having quite restricted range of motion in all direction due to pain.  No neurologic deficit.  He does not want to change his profession or the way he work. We will get x-rays of his both shoulders, AP view with internal and external rotation and Y view. Was given a prescription of Mobic.  Addendum.  He had his shoulder x-rays done which shows severe osteoarthritis on both shoulders with extensive avascular necrosis in the left humoral head. Called the patient and told him the results-apparently patient has very little health literacy.  I told him that I am giving him a referral to see a orthopedic surgeon. Referral to orthopedic surgery was provided.

## 2018-01-18 ENCOUNTER — Other Ambulatory Visit (INDEPENDENT_AMBULATORY_CARE_PROVIDER_SITE_OTHER): Payer: Medicare Other

## 2018-01-18 ENCOUNTER — Ambulatory Visit (HOSPITAL_COMMUNITY)
Admission: RE | Admit: 2018-01-18 | Discharge: 2018-01-18 | Disposition: A | Discharge status: Home / Self Care | Payer: Medicare Other | Source: Ambulatory Visit | Attending: Internal Medicine | Admitting: Internal Medicine

## 2018-01-18 DIAGNOSIS — M879 Osteonecrosis, unspecified: Secondary | ICD-10-CM | POA: Insufficient documentation

## 2018-01-18 DIAGNOSIS — M19011 Primary osteoarthritis, right shoulder: Secondary | ICD-10-CM | POA: Diagnosis not present

## 2018-01-18 DIAGNOSIS — R768 Other specified abnormal immunological findings in serum: Secondary | ICD-10-CM | POA: Diagnosis not present

## 2018-01-18 DIAGNOSIS — M25512 Pain in left shoulder: Secondary | ICD-10-CM | POA: Diagnosis not present

## 2018-01-18 DIAGNOSIS — M25511 Pain in right shoulder: Secondary | ICD-10-CM | POA: Insufficient documentation

## 2018-01-18 DIAGNOSIS — G8929 Other chronic pain: Secondary | ICD-10-CM | POA: Diagnosis not present

## 2018-01-18 DIAGNOSIS — M19012 Primary osteoarthritis, left shoulder: Secondary | ICD-10-CM | POA: Diagnosis not present

## 2018-01-18 LAB — HEPATITIS C ANTIBODY: HEP C VIRUS AB: 10.8 {s_co_ratio} — AB (ref 0.0–0.9)

## 2018-01-18 NOTE — Progress Notes (Signed)
Internal Medicine Clinic Attending  Case discussed with Dr. Amin at the time of the visit.  We reviewed the resident's history and exam and pertinent patient test results.  I agree with the assessment, diagnosis, and plan of care documented in the resident's note.    

## 2018-01-18 NOTE — Addendum Note (Signed)
Addended by: Lorella Nimrod on: 01/18/2018 09:09 AM   Modules accepted: Orders

## 2018-01-19 DIAGNOSIS — R768 Other specified abnormal immunological findings in serum: Secondary | ICD-10-CM | POA: Insufficient documentation

## 2018-01-19 LAB — CMP14 + ANION GAP
A/G RATIO: 1.2 (ref 1.2–2.2)
ALT: 64 IU/L — ABNORMAL HIGH (ref 0–44)
AST: 49 IU/L — ABNORMAL HIGH (ref 0–40)
Albumin: 4 g/dL (ref 3.5–5.5)
Alkaline Phosphatase: 169 IU/L — ABNORMAL HIGH (ref 39–117)
Anion Gap: 14 mmol/L (ref 10.0–18.0)
BUN/Creatinine Ratio: 8 — ABNORMAL LOW (ref 9–20)
BUN: 7 mg/dL (ref 6–24)
Bilirubin Total: <0.2 mg/dL (ref 0.0–1.2)
CALCIUM: 10.1 mg/dL (ref 8.7–10.2)
CO2: 26 mmol/L (ref 20–29)
Chloride: 101 mmol/L (ref 96–106)
Creatinine, Ser: 0.84 mg/dL (ref 0.76–1.27)
GFR, EST AFRICAN AMERICAN: 112 mL/min/{1.73_m2} (ref >59)
GFR, EST NON AFRICAN AMERICAN: 97 mL/min/{1.73_m2} (ref >59)
GLOBULIN, TOTAL: 3.3 g/dL (ref 1.5–4.5)
Glucose: 100 mg/dL — ABNORMAL HIGH (ref 65–99)
POTASSIUM: 5.3 mmol/L — AB (ref 3.5–5.2)
SODIUM: 141 mmol/L (ref 134–144)
TOTAL PROTEIN: 7.3 g/dL (ref 6.0–8.5)

## 2018-01-19 NOTE — Addendum Note (Signed)
Addended by: Lorella Nimrod on: 01/19/2018 10:25 AM   Modules accepted: Orders

## 2018-01-19 NOTE — Assessment & Plan Note (Addendum)
Addendum.  He was found to have positive hepatitis C antibody. CMP repeat shows transaminitis with mild hyperkalemia at 5.3.  Hep C viral load and genotype results are pending. We will repeat CMP during next follow-up visit.  Addendum 01/22/18.  His viral load is undetectable so he has immunity.

## 2018-01-22 LAB — HEPATITIS C GENOTYPE

## 2018-01-22 LAB — HCV RNA QUANT: Hepatitis C Quantitation: NOT DETECTED IU/mL

## 2018-01-30 ENCOUNTER — Ambulatory Visit (INDEPENDENT_AMBULATORY_CARE_PROVIDER_SITE_OTHER): Payer: Medicare Other | Admitting: Orthopaedic Surgery

## 2018-01-30 DIAGNOSIS — M25512 Pain in left shoulder: Secondary | ICD-10-CM | POA: Diagnosis not present

## 2018-01-30 DIAGNOSIS — M25511 Pain in right shoulder: Secondary | ICD-10-CM | POA: Diagnosis not present

## 2018-01-30 DIAGNOSIS — G8929 Other chronic pain: Secondary | ICD-10-CM | POA: Diagnosis not present

## 2018-01-30 MED ORDER — TRAMADOL HCL 50 MG PO TABS: 50.0000 mg | ORAL_TABLET | Freq: Three times a day (TID) | ORAL | 2 refills | Status: DC | PRN

## 2018-01-30 NOTE — Progress Notes (Signed)
Office Visit Note   Patient: Stephen [name]           Date of Birth: 1961-05-04           MRN: [id number] Visit Date: 01/30/2018              Requested by: Lorella Nimrod, MD [address] [place], [place] [address] PCP: Lorella Nimrod, MD   Assessment & Plan: Visit Diagnoses:  1. Chronic pain of both shoulders     Plan: Impression is bilateral shoulder avascular necrosis worse on the left with subchondral bone collapse involving 30-40% of the humeral head.  I think patient would not get much relief from cortisone injections at this point.  We briefly discussed a shoulder replacement surgery which the patient would like to discuss further with Dr. Marlou Sa.  We will get him set up with an appointment with Dr. Marlou Sa next available.  Questions encouraged and answered.  Prescription for tramadol.  Follow-Up Instructions: Return for Dr. Marlou Sa appt ASAP for left shoulder AVN.   Orders:  No orders of the defined types were placed in this encounter.  Meds ordered this encounter  Medications  . traMADol (ULTRAM) 50 MG tablet    Sig: Take 1-2 tablets (50-100 mg total) by mouth 3 (three) times daily as needed.    Dispense:  30 tablet    Refill:  2      Procedures: No procedures performed   Clinical Data: No additional findings.   Subjective: Chief Complaint  Patient presents with  . Right Shoulder - Pain, Edema  . Left Shoulder - Pain, Edema    Patient is a 57 year old gentleman who comes in with bilateral shoulder pain worse on the left many years.  He does endorse a history of alcohol use heavy at times.  He denies a history of sickle cell disease or chronic steroid use.  He has significant pain with difficulty with ADLs and quality of life.  Denies any numbness and tingling.  Denies any radicular symptoms.   Review of Systems  Constitutional: Negative.   All other systems reviewed and are negative.    Objective: Vital Signs: There were no vitals taken for this  visit.  Physical Exam  Constitutional: He is oriented to person, place, and time. He appears well-developed and well-nourished.  HENT:  Head: Normocephalic and atraumatic.  Eyes: Pupils are equal, round, and reactive to light.  Neck: Neck supple.  Pulmonary/Chest: Effort normal.  Abdominal: Soft.  Musculoskeletal: Normal range of motion.  Neurological: He is alert and oriented to person, place, and time.  Skin: Skin is warm.  Psychiatric: He has a normal mood and affect. His behavior is normal. Judgment and thought content normal.  Nursing note and vitals reviewed.   Ortho Exam Bilateral shoulder exam shows limited range of motion with significant crepitus and catching secondary to pain.  Left supraspinatus 4 out of 5.  Right infraspinatus 4- out of 5.   Specialty Comments:  No specialty comments available.  Imaging: No results found.   PMFS History: Patient Active Problem List   Diagnosis Date Noted  . Hepatitis C antibody test positive 01/19/2018  . Chronic pain of both shoulders 01/17/2018  . Screening for colon cancer 01/17/2018  . Sinus congestion 01/17/2018  . Malnutrition of moderate degree 01/09/2018  . Acute alcoholic pancreatitis 29/79/8921   Past Medical History:  Diagnosis Date  . Pancreatitis     No family history on file.  Past Surgical History:  Procedure  Laterality Date  . FRACTURE SURGERY     Social History   Occupational History  . Not on file  Tobacco Use  . Smoking status: Current Every Day Smoker    Types: Cigarettes  . Smokeless tobacco: Never Used  . Tobacco comment: 2 cigs/day  Substance and Sexual Activity  . Alcohol use: Yes    Comment: Beer daily, but none today.  . Drug use: Yes    Types: Marijuana  . Sexual activity: Not on file

## 2018-02-08 ENCOUNTER — Encounter (INDEPENDENT_AMBULATORY_CARE_PROVIDER_SITE_OTHER): Payer: Self-pay | Admitting: Orthopedic Surgery

## 2018-02-08 ENCOUNTER — Ambulatory Visit (INDEPENDENT_AMBULATORY_CARE_PROVIDER_SITE_OTHER): Payer: Medicare Other | Admitting: Orthopedic Surgery

## 2018-02-08 DIAGNOSIS — M19012 Primary osteoarthritis, left shoulder: Secondary | ICD-10-CM | POA: Diagnosis not present

## 2018-02-08 DIAGNOSIS — M19011 Primary osteoarthritis, right shoulder: Secondary | ICD-10-CM

## 2018-02-11 NOTE — Progress Notes (Signed)
Office Visit Note   Patient: Stephen [name]           Date of Birth: [birth year]           MRN: [id number] Visit Date: 02/08/2018 Requested by: Lorella Nimrod, MD [address] [place], [place] [address] PCP: Lorella Nimrod, MD  Subjective: Chief Complaint  Patient presents with  . Left Shoulder - Pain  . Right Shoulder - Pain    HPI: Arjan is a patient with bilateral shoulder pain for 10 years.  He has been seen by 1 of my partners.  He has chronic pain.  Patient has avascular necrosis of both shoulders left worse than right.  He is right-hand dominant.  He previously did electrical work.  He is on disability because of his shoulders.  Radiographs from 01/18/2018 are reviewed and it does show bilateral avascular necrosis with definite collapse on the left-hand side and less collapse on the right-hand side.  He has an appointment at the [facility] on Tuesday for further evaluation.  There is also evidence of rotator cuff arthropathy in both shoulders.              ROS: All systems reviewed are negative as they relate to the chief complaint within the history of present illness.  Patient denies  fevers or chills.   Assessment & Plan: Visit Diagnoses:  1. Bilateral shoulder region arthritis     Plan: Impression is bilateral shoulder avascular necrosis and rotator cuff arthropathy.  Plan is that this patient has very few options for shoulder pain relief.  He is on the young side for reverse shoulder replacement but that is likely in his future.  He is going to see what they say at the [place] and I will see him back should he desire further surgical intervention.  Would like to get thin cut CTs for preoperative planning if he does decide to get the surgery route.  Follow-up as needed.  Follow-Up Instructions: Return if symptoms worsen or fail to improve.   Orders:  No orders of the defined types were placed in this encounter.  No orders of the defined types were placed in this encounter.     Procedures: No procedures performed   Clinical Data: No additional findings.  Objective: Vital Signs: There were no vitals taken for this visit.  Physical Exam:   Constitutional: Patient appears well-developed HEENT:  Head: Normocephalic Eyes:EOM are normal Neck: Normal range of motion Cardiovascular: Normal rate Pulmonary/chest: Effort normal Neurologic: Patient is alert Skin: Skin is warm Psychiatric: Patient has normal mood and affect    Ortho Exam: Orthopedic exam demonstrates good cervical spine range of motion.  5 out of 5 grip EPL FPL interosseous wrist flexion wrist extension biceps triceps and deltoid strength.  Radial pulse intact bilaterally.  He is got some coarseness with passive range of motion on the left and right.  Does not have forward flexion and abduction past 90 degrees on either side.  Cuff strength is definitely weaker and external rotation bilaterally.  About 4 out of 5 on both sides.  Subscap strength a little bit stronger at 4+ out of 5 bilaterally.  Specialty Comments:  No specialty comments available.  Imaging: No results found.   PMFS History: Patient Active Problem List   Diagnosis Date Noted  . Hepatitis C antibody test positive 01/19/2018  . Chronic pain of both shoulders 01/17/2018  . Screening for colon cancer 01/17/2018  . Sinus congestion 01/17/2018  . Malnutrition  of moderate degree 01/09/2018  . Acute alcoholic pancreatitis 70/92/9574   Past Medical History:  Diagnosis Date  . Pancreatitis     History reviewed. No pertinent family history.  Past Surgical History:  Procedure Laterality Date  . FRACTURE SURGERY     Social History   Occupational History  . Not on file  Tobacco Use  . Smoking status: Current Every Day Smoker    Types: Cigarettes  . Smokeless tobacco: Never Used  . Tobacco comment: 2 cigs/day  Substance and Sexual Activity  . Alcohol use: Yes    Comment: Beer daily, but none today.  . Drug use: Yes     Types: Marijuana  . Sexual activity: Not on file

## 2018-04-09 ENCOUNTER — Encounter: Payer: Self-pay | Admitting: Internal Medicine

## 2018-04-16 NOTE — Addendum Note (Signed)
Addended by: Hulan Fray on: 04/16/2018 06:11 PM   Modules accepted: Orders

## 2018-08-06 ENCOUNTER — Telehealth (INDEPENDENT_AMBULATORY_CARE_PROVIDER_SITE_OTHER): Payer: Self-pay | Admitting: Orthopedic Surgery

## 2018-08-06 DIAGNOSIS — M19012 Primary osteoarthritis, left shoulder: Principal | ICD-10-CM

## 2018-08-06 DIAGNOSIS — M19011 Primary osteoarthritis, right shoulder: Secondary | ICD-10-CM

## 2018-08-06 NOTE — Telephone Encounter (Signed)
Great thx

## 2018-08-06 NOTE — Telephone Encounter (Signed)
Patient would like to go ahead with surgery. Patient call back # [phone]

## 2018-08-06 NOTE — Telephone Encounter (Signed)
Can you please fill out blue sheet for patient? IC and s/w patient and advised would be contacted to get surgery scheduled but we would need to do the thin cut CT scans first. I have put order in for bilateral thin cut CT scan.

## 2018-08-12 ENCOUNTER — Emergency Department (HOSPITAL_COMMUNITY)
Admission: EM | Admit: 2018-08-12 | Discharge: 2018-08-13 | Disposition: A | Discharge status: Home / Self Care | Payer: Medicare Other | Attending: Emergency Medicine | Admitting: Emergency Medicine

## 2018-08-12 ENCOUNTER — Encounter (HOSPITAL_COMMUNITY): Payer: Self-pay | Admitting: Emergency Medicine

## 2018-08-12 ENCOUNTER — Emergency Department (HOSPITAL_COMMUNITY): Payer: Medicare Other

## 2018-08-12 DIAGNOSIS — Z79899 Other long term (current) drug therapy: Secondary | ICD-10-CM | POA: Diagnosis not present

## 2018-08-12 DIAGNOSIS — F17210 Nicotine dependence, cigarettes, uncomplicated: Secondary | ICD-10-CM | POA: Diagnosis not present

## 2018-08-12 DIAGNOSIS — R109 Unspecified abdominal pain: Secondary | ICD-10-CM | POA: Diagnosis present

## 2018-08-12 DIAGNOSIS — K852 Alcohol induced acute pancreatitis without necrosis or infection: Secondary | ICD-10-CM | POA: Diagnosis not present

## 2018-08-12 DIAGNOSIS — K859 Acute pancreatitis without necrosis or infection, unspecified: Secondary | ICD-10-CM | POA: Diagnosis not present

## 2018-08-12 LAB — CBC
HEMATOCRIT: 50.8 % (ref 39.0–52.0)
Hemoglobin: 16.5 g/dL (ref 13.0–17.0)
MCH: 29.5 pg (ref 26.0–34.0)
MCHC: 32.5 g/dL (ref 30.0–36.0)
MCV: 90.7 fL (ref 78.0–100.0)
Platelets: 358 10*3/uL (ref 150–400)
RBC: 5.6 MIL/uL (ref 4.22–5.81)
RDW: 13.8 % (ref 11.5–15.5)
WBC: 9.4 10*3/uL (ref 4.0–10.5)

## 2018-08-12 LAB — COMPREHENSIVE METABOLIC PANEL
ALBUMIN: 4.3 g/dL (ref 3.5–5.0)
ALT: 41 U/L (ref 0–44)
AST: 57 U/L — ABNORMAL HIGH (ref 15–41)
Alkaline Phosphatase: 97 U/L (ref 38–126)
Anion gap: 12 (ref 5–15)
BUN: 7 mg/dL (ref 6–20)
CHLORIDE: 96 mmol/L — AB (ref 98–111)
CO2: 24 mmol/L (ref 22–32)
CREATININE: 0.93 mg/dL (ref 0.61–1.24)
Calcium: 9.7 mg/dL (ref 8.9–10.3)
GFR calc non Af Amer: >60 mL/min (ref >60)
GLUCOSE: 123 mg/dL — AB (ref 70–99)
Potassium: 4 mmol/L (ref 3.5–5.1)
SODIUM: 132 mmol/L — AB (ref 135–145)
Total Bilirubin: 0.9 mg/dL (ref 0.3–1.2)
Total Protein: 8.2 g/dL — ABNORMAL HIGH (ref 6.5–8.1)

## 2018-08-12 LAB — LIPASE, BLOOD: LIPASE: 146 U/L — AB (ref 11–51)

## 2018-08-12 MED ORDER — SODIUM CHLORIDE 0.9 % IV BOLUS
1000.0000 mL | Freq: Once | INTRAVENOUS | Status: AC
  Administered 2018-08-12: 1000 mL via INTRAVENOUS

## 2018-08-12 MED ORDER — PROMETHAZINE HCL 25 MG PO TABS: 25.0000 mg | ORAL_TABLET | Freq: Four times a day (QID) | ORAL | 0 refills | Status: DC | PRN

## 2018-08-12 MED ORDER — MORPHINE SULFATE (PF) 4 MG/ML IV SOLN
4.0000 mg | Freq: Once | INTRAVENOUS | Status: AC
  Administered 2018-08-12: 4 mg via INTRAVENOUS
  Filled 2018-08-12: qty 1

## 2018-08-12 MED ORDER — HYDROMORPHONE HCL 1 MG/ML IJ SOLN
1.0000 mg | Freq: Once | INTRAMUSCULAR | Status: AC
  Administered 2018-08-12: 1 mg via INTRAVENOUS
  Filled 2018-08-12: qty 1

## 2018-08-12 MED ORDER — ONDANSETRON HCL 4 MG/2ML IJ SOLN
4.0000 mg | Freq: Once | INTRAMUSCULAR | Status: AC
  Administered 2018-08-12: 4 mg via INTRAVENOUS
  Filled 2018-08-12: qty 2

## 2018-08-12 MED ORDER — HYDROCODONE-ACETAMINOPHEN 5-325 MG PO TABS: 1.0000 | ORAL_TABLET | Freq: Four times a day (QID) | ORAL | 0 refills | Status: DC | PRN

## 2018-08-12 MED ORDER — IOHEXOL 300 MG/ML  SOLN
100.0000 mL | Freq: Once | INTRAMUSCULAR | Status: AC | PRN
  Administered 2018-08-12: 100 mL via INTRAVENOUS

## 2018-08-12 NOTE — ED Notes (Addendum)
BP 126/60

## 2018-08-12 NOTE — ED Triage Notes (Signed)
Pt presents with RUQ abd pain x 2 days; was given Rx for naproxen but no improvement; pt reporting "hot then cold sweats", some nausea, no vomiting

## 2018-08-12 NOTE — ED Notes (Signed)
Be advised: patient is very irritated and verbally abusive

## 2018-08-12 NOTE — ED Provider Notes (Signed)
[name] Provider Note   CSN: [id number] Arrival date & time: 08/12/18  1625     History   Chief Complaint Chief Complaint  Patient presents with  . Abdominal Pain  . Chills  . Nausea    HPI Stephen [name] is a 57 y.o. male.  HPI Patient presents to the emergency department with abdominal discomfort with vomiting over the last 2 days.  The patient states that nothing seems to make his condition better.  Patient states he did take some naproxen which did not help his pain.  Patient states that he has had a history of pancreatitis and this does feel similar.  The patient denies chest pain, shortness of breath, headache,blurred vision, neck pain, fever, cough, weakness, numbness, dizziness, anorexia, edema,diarrhea, rash, back pain, dysuria, hematemesis, bloody stool, near syncope, or syncope. Past Medical History:  Diagnosis Date  . Pancreatitis     Patient Active Problem List   Diagnosis Date Noted  . Hepatitis C antibody test positive 01/19/2018  . Chronic pain of both shoulders 01/17/2018  . Screening for colon cancer 01/17/2018  . Sinus congestion 01/17/2018  . Malnutrition of moderate degree 01/09/2018  . Acute alcoholic pancreatitis 00/86/7619    Past Surgical History:  Procedure Laterality Date  . FRACTURE SURGERY          Home Medications    Prior to Admission medications   Medication Sig Start Date End Date Taking? Authorizing Provider  fexofenadine (ALLEGRA ALLERGY) 180 MG tablet Take 1 tablet (180 mg total) by mouth daily. 01/17/18   Lorella Nimrod, MD  meloxicam (MOBIC) 15 MG tablet Take 1 tablet (15 mg total) by mouth daily. 01/17/18   Lorella Nimrod, MD  naproxen (NAPROSYN) 500 MG tablet Take 1 tablet (500 mg total) by mouth 2 (two) times daily with a meal. 01/09/18 01/09/19  Purohit, Konrad Dolores, MD  ondansetron (ZOFRAN) 4 MG tablet Take 1 tablet (4 mg total) by mouth every 6 (six) hours as needed for nausea or  vomiting. Patient not taking: Reported on 5/0/9326 05/18/44   Delora Fuel, MD  oxyCODONE-acetaminophen (PERCOCET) 5-325 MG tablet Take 1 tablet by mouth every 4 (four) hours as needed for moderate pain. Patient not taking: Reported on 8/0/9983 3/82/50   Delora Fuel, MD  traMADol (ULTRAM) 50 MG tablet Take 1-2 tablets (50-100 mg total) by mouth 3 (three) times daily as needed. 01/30/18   Leandrew Koyanagi, MD    Family History History reviewed. No pertinent family history.  Social History Social History   Tobacco Use  . Smoking status: Current Every Day Smoker    Types: Cigarettes  . Smokeless tobacco: Never Used  . Tobacco comment: 2 cigs/day  Substance Use Topics  . Alcohol use: Yes    Comment: Beer daily, but none today.  . Drug use: Yes    Types: Marijuana     Allergies   Patient has no known allergies.   Review of Systems Review of Systems All other systems negative except as documented in the HPI. All pertinent positives and negatives as reviewed in the HPI.  Physical Exam Updated Vital Signs BP (!) 123/113 (BP Location: Right Arm)   Pulse 95   Temp 98.2 F (36.8 C) (Oral)   Resp 18   Ht 5\' 7"  (1.702 m)   Wt 59 kg   SpO2 100%   BMI 20.36 kg/m   Physical Exam  Constitutional: He is oriented to person, place, and time. He appears well-developed  and well-nourished. No distress.  HENT:  Head: Normocephalic and atraumatic.  Mouth/Throat: Oropharynx is clear and moist.  Eyes: Pupils are equal, round, and reactive to light.  Neck: Normal range of motion. Neck supple.  Cardiovascular: Normal rate, regular rhythm and normal heart sounds. Exam reveals no gallop and no friction rub.  No murmur heard. Pulmonary/Chest: Effort normal and breath sounds normal. No respiratory distress. He has no wheezes.  Abdominal: Soft. Bowel sounds are normal. He exhibits no distension. There is generalized tenderness and tenderness in the right upper quadrant and periumbilical area.  There is no rigidity and no guarding.  Neurological: He is alert and oriented to person, place, and time. He exhibits normal muscle tone. Coordination normal.  Skin: Skin is warm and dry. Capillary refill takes less than 2 seconds. No rash noted. No erythema.  Psychiatric: He has a normal mood and affect. His behavior is normal.  Nursing note and vitals reviewed.    ED Treatments / Results  Labs (all labs ordered are listed, but only abnormal results are displayed) Labs Reviewed  LIPASE, BLOOD - Abnormal; Notable for the following components:      Result Value   Lipase 146 (*)    All other components within normal limits  COMPREHENSIVE METABOLIC PANEL - Abnormal; Notable for the following components:   Sodium 132 (*)    Chloride 96 (*)    Glucose, Bld 123 (*)    Total Protein 8.2 (*)    AST 57 (*)    All other components within normal limits  CBC  URINALYSIS, ROUTINE W REFLEX MICROSCOPIC    EKG None  Radiology Ct Abdomen Pelvis W Contrast  Result Date: 08/12/2018 CLINICAL DATA:  Right upper quadrant abdominal pain x2 days, nausea, history of pancreatitis. EXAM: CT ABDOMEN AND PELVIS WITH CONTRAST TECHNIQUE: Multidetector CT imaging of the abdomen and pelvis was performed using the standard protocol following bolus administration of intravenous contrast. CONTRAST:  128mL OMNIPAQUE IOHEXOL 300 MG/ML  SOLN COMPARISON:  01/05/2018 FINDINGS: Lower chest: Lung bases are clear. Hepatobiliary: 11 mm hypervascular lesion in segment 2 (series 3/image 8), unchanged. Gallbladder is unremarkable. No intrahepatic or extrahepatic ductal dilatation. Pancreas: Peripancreatic inflammatory changes centered along the pancreatic head/uncinate process, compatible with acute pancreatitis. 14 x 13 x 16 mm low-density lesion in the pancreatic uncinate process (series 3/image 26), similar to the prior, likely reflecting a small peripancreatic fluid collection. No pancreatic ductal dilatation or atrophy.  Spleen: Within normal limits. Adrenals/Urinary Tract: Adrenal glands within normal limits. Kidneys are within normal limits.  No hydronephrosis. Bladder is within normal limits. Stomach/Bowel: Secondary inflammatory changes involving the gastric antrum and proximal duodenum. No evidence of bowel obstruction. Normal appendix (series 3/image 85). Scattered right colonic diverticulosis, without evidence of diverticulitis. Left colon is decompressed. Vascular/Lymphatic: No evidence of abdominal aortic aneurysm. Atherosclerotic calcifications of the abdominal aorta and branch vessels. Portal vein, SMV, and splenic vein remain patent. Reproductive: Prostate is grossly unremarkable. Other: No abdominopelvic ascites. Musculoskeletal: Mild degenerative changes of the lumbar spine. IMPRESSION: Acute pancreatitis. 16 mm low-density lesion in the pancreatic uncinate process, similar to the prior, likely reflecting a small peripancreatic fluid collection. Follow-up MRI abdomen with/without contrast is suggested in 4-6 weeks after resolution of the patient's acute symptoms. Secondary inflammatory changes involving the gastric antrum and proximal duodenum. Electronically Signed   By: Julian Hy M.D.   On: 08/12/2018 19:27    Procedures Procedures (including critical care time)  Medications Ordered in ED Medications  sodium chloride  0.9 % bolus 1,000 mL (0 mLs Intravenous Stopped 08/12/18 2042)  morphine 4 MG/ML injection 4 mg (4 mg Intravenous Given 08/12/18 1827)  ondansetron (ZOFRAN) injection 4 mg (4 mg Intravenous Given 08/12/18 1827)  iohexol (OMNIPAQUE) 300 MG/ML solution 100 mL (100 mLs Intravenous Contrast Given 08/12/18 1903)  HYDROmorphone (DILAUDID) injection 1 mg (1 mg Intravenous Given 08/12/18 2053)     Initial Impression / Assessment and Plan / ED Course  I have reviewed the triage vital signs and the nursing notes.  Pertinent labs & imaging results that were available during my care of the  patient were reviewed by me and considered in my medical decision making (see chart for details).     Patient will be treated for pancreatitis.  He has been feeling better here in the emergency department and will be discharged home.  The patient is advised to return precautions.  Patient agrees the plan and all questions were answered.  Patient did receive IV fluids as well here in the ER.  He has not been vomiting over the last several hours.  Final Clinical Impressions(s) / ED Diagnoses   Final diagnoses:  None    ED Discharge Orders    None       Donny, [place], PA-C 08/13/18 0038    Carmin Muskrat, MD 08/13/18 2302

## 2018-08-12 NOTE — Discharge Instructions (Signed)
Return here as needed.  He have what appears to be pancreatitis again.  Follow-up clear liquid diet and attempt to cut back on your alcohol intake.

## 2018-08-13 NOTE — ED Notes (Signed)
Pt. Did not have any further questions when discussing discharge paperwork.

## 2018-09-05 ENCOUNTER — Encounter (INDEPENDENT_AMBULATORY_CARE_PROVIDER_SITE_OTHER): Payer: Self-pay | Admitting: *Deleted

## 2018-09-14 ENCOUNTER — Ambulatory Visit
Admission: RE | Admit: 2018-09-14 | Discharge: 2018-09-14 | Disposition: A | Discharge status: Home / Self Care | Payer: Medicare Other | Source: Ambulatory Visit | Attending: Orthopedic Surgery | Admitting: Orthopedic Surgery

## 2018-09-14 DIAGNOSIS — M19011 Primary osteoarthritis, right shoulder: Secondary | ICD-10-CM | POA: Diagnosis not present

## 2018-09-14 DIAGNOSIS — M19012 Primary osteoarthritis, left shoulder: Principal | ICD-10-CM

## 2018-10-03 ENCOUNTER — Ambulatory Visit (INDEPENDENT_AMBULATORY_CARE_PROVIDER_SITE_OTHER): Payer: Medicare Other | Admitting: Orthopedic Surgery

## 2018-10-10 ENCOUNTER — Ambulatory Visit (INDEPENDENT_AMBULATORY_CARE_PROVIDER_SITE_OTHER): Payer: Medicare Other | Admitting: Orthopedic Surgery

## 2018-10-10 ENCOUNTER — Encounter (INDEPENDENT_AMBULATORY_CARE_PROVIDER_SITE_OTHER): Payer: Self-pay | Admitting: Orthopedic Surgery

## 2018-10-10 DIAGNOSIS — M19012 Primary osteoarthritis, left shoulder: Secondary | ICD-10-CM

## 2018-10-10 DIAGNOSIS — M19011 Primary osteoarthritis, right shoulder: Secondary | ICD-10-CM | POA: Diagnosis not present

## 2018-10-10 NOTE — Progress Notes (Signed)
Office Visit Note   Patient: Stephen [name]           Date of Birth: May 04, 1961           MRN: [id number] Visit Date: 10/10/2018 Requested by: Lorella Nimrod, MD [address] [place], [place] [address] PCP: Lorella Nimrod, MD  Subjective: Chief Complaint  Patient presents with  . Right Shoulder - Follow-up  . Left Shoulder - Follow-up    HPI: Patient presents for review of CT scans of both shoulders.  He has significant shoulder arthritis.  CT scan show rotator cuff arthropathy and end-stage arthritis in both shoulders.  Not taking any pain medicine.  He is a smoker and states that he drinks 3 beers per day.  It may be more than that.  He is not taking any medicines that he admits to at this time.  He lives with his nephew and has family support at home.              ROS: All systems reviewed are negative as they relate to the chief complaint within the history of present illness.  Patient denies  fevers or chills.   Assessment & Plan: Visit Diagnoses:  1. Bilateral shoulder region arthritis     Plan: Impression is bilateral rotator cuff arthropathy and end-stage arthritis in both shoulders and the patient has significant functional limitations.  Plan is to draw an albumin level at this time just to make sure his nutritional status is adequate for elective joint replacement.  The risk and benefits of surgery are discussed including but limited to infection nerve vessel damage potential need for revision surgery in his lifetime along with dislocation.  Patient understands risk benefits and wishes to proceed.  All questions answered.  Follow-Up Instructions: No follow-ups on file.   Orders:  No orders of the defined types were placed in this encounter.  No orders of the defined types were placed in this encounter.     Procedures: No procedures performed   Clinical Data: No additional findings.  Objective: Vital Signs: There were no vitals taken for this visit.  Physical  Exam:   Constitutional: Patient appears well-developed HEENT:  Head: Normocephalic Eyes:EOM are normal Neck: Normal range of motion Cardiovascular: Normal rate Pulmonary/chest: Effort normal Neurologic: Patient is alert Skin: Skin is warm Psychiatric: Patient has normal mood and affect    Ortho Exam: Ortho exam demonstrates full active and passive cervical spine range of motion.  Bilaterally the patient has about 45 degrees of abduction and about 80 degrees of forward flexion.  Deltoid is functional bilaterally.  Radial pulses intact bilaterally.  Significant coarseness and grinding is present with active and passive range of motion of each shoulder.  Specialty Comments:  No specialty comments available.  Imaging: No results found.   PMFS History: Patient Active Problem List   Diagnosis Date Noted  . Hepatitis C antibody test positive 01/19/2018  . Chronic pain of both shoulders 01/17/2018  . Screening for colon cancer 01/17/2018  . Sinus congestion 01/17/2018  . Malnutrition of moderate degree 01/09/2018  . Acute alcoholic pancreatitis 81/82/9937   Past Medical History:  Diagnosis Date  . Pancreatitis     History reviewed. No pertinent family history.  Past Surgical History:  Procedure Laterality Date  . FRACTURE SURGERY     Social History   Occupational History  . Not on file  Tobacco Use  . Smoking status: Current Every Day Smoker    Types: Cigarettes  .  Smokeless tobacco: Never Used  . Tobacco comment: 2 cigs/day  Substance and Sexual Activity  . Alcohol use: Yes    Comment: Beer daily, but none today.  . Drug use: Yes    Types: Marijuana  . Sexual activity: Not on file

## 2018-10-11 ENCOUNTER — Telehealth (INDEPENDENT_AMBULATORY_CARE_PROVIDER_SITE_OTHER): Payer: Self-pay | Admitting: Orthopedic Surgery

## 2018-10-11 ENCOUNTER — Other Ambulatory Visit (INDEPENDENT_AMBULATORY_CARE_PROVIDER_SITE_OTHER): Payer: Self-pay

## 2018-10-11 NOTE — Telephone Encounter (Signed)
Patient called left voicemail message he is out of town until Monday. Patient said he got a call about a blood test. The number to contact patient is [phone]

## 2018-10-11 NOTE — Telephone Encounter (Signed)
The patient can come here to have this drawn.  I will try calling him tomorrow to set up a time for when the patient is back in town.

## 2018-10-11 NOTE — Telephone Encounter (Signed)
Can you do me a favor and help me with this one? I do not know how to put in a future lab for [address] for him to get an Albumin drawn

## 2018-10-11 NOTE — Telephone Encounter (Signed)
Want to give him his lab results?

## 2018-10-11 NOTE — Telephone Encounter (Signed)
He needs to have albumin drawn thx

## 2018-10-12 NOTE — Telephone Encounter (Signed)
Spoke with patient and scheduled a lab only appointment in our office on Monday 10/15/18 at 9:00.

## 2018-10-15 ENCOUNTER — Ambulatory Visit (INDEPENDENT_AMBULATORY_CARE_PROVIDER_SITE_OTHER): Payer: Medicare Other

## 2018-10-15 ENCOUNTER — Other Ambulatory Visit (INDEPENDENT_AMBULATORY_CARE_PROVIDER_SITE_OTHER): Payer: Self-pay

## 2018-10-15 DIAGNOSIS — M19011 Primary osteoarthritis, right shoulder: Secondary | ICD-10-CM

## 2018-10-15 DIAGNOSIS — E639 Nutritional deficiency, unspecified: Secondary | ICD-10-CM

## 2018-10-15 DIAGNOSIS — M19012 Primary osteoarthritis, left shoulder: Secondary | ICD-10-CM

## 2018-10-15 LAB — ALBUMIN: ALBUMIN MSPROF: 4.2 g/dL (ref 3.6–5.1)

## 2018-10-15 NOTE — Progress Notes (Addendum)
Lab drawn (albumin) to check for nutrition status prior to shoulder surgery per Dr. Marlou Sa.

## 2018-10-16 ENCOUNTER — Emergency Department (HOSPITAL_COMMUNITY): Payer: Medicare Other

## 2018-10-16 ENCOUNTER — Encounter (HOSPITAL_COMMUNITY): Payer: Self-pay | Admitting: Emergency Medicine

## 2018-10-16 ENCOUNTER — Emergency Department (HOSPITAL_COMMUNITY)
Admission: EM | Admit: 2018-10-16 | Discharge: 2018-10-16 | Disposition: A | Discharge status: Home / Self Care | Payer: Medicare Other | Attending: Emergency Medicine | Admitting: Emergency Medicine

## 2018-10-16 DIAGNOSIS — F17210 Nicotine dependence, cigarettes, uncomplicated: Secondary | ICD-10-CM | POA: Diagnosis not present

## 2018-10-16 DIAGNOSIS — K852 Alcohol induced acute pancreatitis without necrosis or infection: Secondary | ICD-10-CM | POA: Diagnosis not present

## 2018-10-16 DIAGNOSIS — R079 Chest pain, unspecified: Secondary | ICD-10-CM | POA: Diagnosis not present

## 2018-10-16 DIAGNOSIS — R109 Unspecified abdominal pain: Secondary | ICD-10-CM | POA: Diagnosis not present

## 2018-10-16 DIAGNOSIS — K859 Acute pancreatitis without necrosis or infection, unspecified: Secondary | ICD-10-CM | POA: Diagnosis not present

## 2018-10-16 LAB — I-STAT CG4 LACTIC ACID, ED
Lactic Acid, Venous: 2.81 mmol/L (ref 0.5–1.9)
Lactic Acid, Venous: 1.13 mmol/L (ref 0.5–1.9)

## 2018-10-16 LAB — CBC WITH DIFFERENTIAL/PLATELET
ABS IMMATURE GRANULOCYTES: 0.02 10*3/uL (ref 0.00–0.07)
BASOS ABS: 0.1 10*3/uL (ref 0.0–0.1)
Basophils Relative: 1 %
EOS PCT: 0 %
Eosinophils Absolute: 0 10*3/uL (ref 0.0–0.5)
HEMATOCRIT: 48.2 % (ref 39.0–52.0)
HEMOGLOBIN: 15.1 g/dL (ref 13.0–17.0)
IMMATURE GRANULOCYTES: 0 %
LYMPHS ABS: 1.4 10*3/uL (ref 0.7–4.0)
LYMPHS PCT: 12 %
MCH: 27.6 pg (ref 26.0–34.0)
MCHC: 31.3 g/dL (ref 30.0–36.0)
MCV: 88 fL (ref 80.0–100.0)
Monocytes Absolute: 0.8 10*3/uL (ref 0.1–1.0)
Monocytes Relative: 7 %
Neutro Abs: 9.1 10*3/uL — ABNORMAL HIGH (ref 1.7–7.7)
Neutrophils Relative %: 80 %
Platelets: 456 10*3/uL — ABNORMAL HIGH (ref 150–400)
RBC: 5.48 MIL/uL (ref 4.22–5.81)
RDW: 13.1 % (ref 11.5–15.5)
WBC: 11.4 10*3/uL — ABNORMAL HIGH (ref 4.0–10.5)
nRBC: 0 % (ref 0.0–0.2)

## 2018-10-16 LAB — COMPREHENSIVE METABOLIC PANEL
ALK PHOS: 73 U/L (ref 38–126)
ALT: 27 U/L (ref 0–44)
ANION GAP: 16 — AB (ref 5–15)
AST: 27 U/L (ref 15–41)
Albumin: 4.2 g/dL (ref 3.5–5.0)
BUN: <5 mg/dL — ABNORMAL LOW (ref 6–20)
CALCIUM: 10 mg/dL (ref 8.9–10.3)
CO2: 27 mmol/L (ref 22–32)
Chloride: 94 mmol/L — ABNORMAL LOW (ref 98–111)
Creatinine, Ser: 0.87 mg/dL (ref 0.61–1.24)
GFR calc Af Amer: >60 mL/min (ref >60)
GFR calc non Af Amer: >60 mL/min (ref >60)
GLUCOSE: 140 mg/dL — AB (ref 70–99)
Potassium: 3.5 mmol/L (ref 3.5–5.1)
Sodium: 137 mmol/L (ref 135–145)
Total Bilirubin: 0.5 mg/dL (ref 0.3–1.2)
Total Protein: 8.2 g/dL — ABNORMAL HIGH (ref 6.5–8.1)

## 2018-10-16 LAB — LIPASE, BLOOD: Lipase: 54 U/L — ABNORMAL HIGH (ref 11–51)

## 2018-10-16 MED ORDER — IOPAMIDOL (ISOVUE-370) INJECTION 76%
INTRAVENOUS | Status: AC
  Filled 2018-10-16: qty 100

## 2018-10-16 MED ORDER — LACTATED RINGERS IV BOLUS
1000.0000 mL | Freq: Once | INTRAVENOUS | Status: AC
  Administered 2018-10-16: 1000 mL via INTRAVENOUS

## 2018-10-16 MED ORDER — FENTANYL CITRATE (PF) 100 MCG/2ML IJ SOLN
100.0000 ug | Freq: Once | INTRAMUSCULAR | Status: AC
  Administered 2018-10-16: 100 ug via INTRAVENOUS
  Filled 2018-10-16: qty 2

## 2018-10-16 MED ORDER — IOPAMIDOL (ISOVUE-370) INJECTION 76%
100.0000 mL | Freq: Once | INTRAVENOUS | Status: AC | PRN
  Administered 2018-10-16: 100 mL via INTRAVENOUS

## 2018-10-16 MED ORDER — MORPHINE SULFATE (PF) 4 MG/ML IV SOLN
4.0000 mg | Freq: Once | INTRAVENOUS | Status: AC
  Administered 2018-10-16: 4 mg via INTRAVENOUS
  Filled 2018-10-16: qty 1

## 2018-10-16 MED ORDER — SODIUM CHLORIDE 0.9 % IV SOLN
8.0000 mg | Freq: Once | INTRAVENOUS | Status: AC
  Administered 2018-10-16: 8 mg via INTRAVENOUS
  Filled 2018-10-16: qty 4

## 2018-10-16 MED ORDER — OXYCODONE-ACETAMINOPHEN 5-325 MG PO TABS
1.0000 | ORAL_TABLET | Freq: Once | ORAL | Status: AC
  Administered 2018-10-16: 1 via ORAL
  Filled 2018-10-16: qty 1

## 2018-10-16 MED ORDER — OXYMETAZOLINE HCL 0.05 % NA SOLN
2.0000 | Freq: Once | NASAL | Status: AC
  Administered 2018-10-16: 2 via NASAL
  Filled 2018-10-16: qty 15

## 2018-10-16 MED ORDER — METOCLOPRAMIDE HCL 5 MG/ML IJ SOLN
10.0000 mg | Freq: Once | INTRAMUSCULAR | Status: AC
  Administered 2018-10-16: 10 mg via INTRAVENOUS
  Filled 2018-10-16: qty 2

## 2018-10-16 MED ORDER — ONDANSETRON 4 MG PO TBDP: 4.0000 mg | ORAL_TABLET | Freq: Three times a day (TID) | ORAL | 0 refills | Status: DC | PRN

## 2018-10-16 NOTE — ED Notes (Signed)
Cardiology PA paged for run of [facility] and positive troponin

## 2018-10-16 NOTE — ED Triage Notes (Signed)
Pt arrives with reports of center back pain that radiates to the right side of abd. States it came on sudden around 3 this AM. Reports he vomited once this AM. Denies any nausea.

## 2018-10-16 NOTE — Progress Notes (Signed)
Labs okay.  He is okay for surgery.

## 2018-10-16 NOTE — ED Provider Notes (Signed)
[facility] EMERGENCY DEPARTMENT Provider Note   CSN: [id number] Arrival date & time: 10/16/18  0831     History   Chief Complaint Chief Complaint  Patient presents with  . Flank Pain  . Abdominal Pain    HPI Stephen [name] is a 57 y.o. male.  70yo M w/ PMH including alcoholic pancreatitis, Hep C who p/w back pain, abdominal pain, and nausea/vomiting.  Last night the patient began having pain in the center of his mid back that wraps around to his right side of his abdomen.  Pain has become severe and constant.  He has had associated nausea and vomiting this morning.  When asked if this feels similar to previous pancreatitis, patient states "something like that." He denies any diarrhea or bloody stools.  He reports mild cough but no sore throat, runny nose, or other viral URI sx. no urinary problems.  He states that he drinks 3 beers daily, denies any heavy drinking recently.  Denies IV drug use.  Endorses marijuana use.  The history is provided by the patient.  Flank Pain  Associated symptoms include abdominal pain.  Abdominal Pain      Past Medical History:  Diagnosis Date  . Pancreatitis     Patient Active Problem List   Diagnosis Date Noted  . Hepatitis C antibody test positive 01/19/2018  . Chronic pain of both shoulders 01/17/2018  . Screening for colon cancer 01/17/2018  . Sinus congestion 01/17/2018  . Malnutrition of moderate degree 01/09/2018  . Acute alcoholic pancreatitis 51/88/4166    Past Surgical History:  Procedure Laterality Date  . FRACTURE SURGERY          Home Medications    Prior to Admission medications   Medication Sig Start Date End Date Taking? Authorizing Provider  fexofenadine (ALLEGRA ALLERGY) 180 MG tablet Take 1 tablet (180 mg total) by mouth daily. Patient not taking: Reported on 10/16/2018 01/17/18   Lorella Nimrod, MD  ondansetron (ZOFRAN ODT) 4 MG disintegrating tablet Take 1 tablet (4 mg total) by mouth every  8 (eight) hours as needed for nausea or vomiting. 10/16/18   , Wenda Overland, MD  traMADol (ULTRAM) 50 MG tablet Take 1-2 tablets (50-100 mg total) by mouth 3 (three) times daily as needed. Patient not taking: Reported on 10/16/2018 01/30/18   Leandrew Koyanagi, MD    Family History No family history on file.  Social History Social History   Tobacco Use  . Smoking status: Current Every Day Smoker    Types: Cigarettes  . Smokeless tobacco: Never Used  . Tobacco comment: 2 cigs/day  Substance Use Topics  . Alcohol use: Yes    Comment: Beer daily, but none today.  . Drug use: Yes    Types: Marijuana     Allergies   Patient has no known allergies.   Review of Systems Review of Systems  Gastrointestinal: Positive for abdominal pain.  Genitourinary: Positive for flank pain.   All other systems reviewed and are negative except that which was mentioned in HPI   Physical Exam Updated Vital Signs BP (!) 165/87   Pulse 61   Temp (!) 97.3 F (36.3 C) (Oral)   Resp 11   Ht 5\' 7"  (1.702 m)   Wt 61.2 kg   SpO2 99%   BMI 21.14 kg/m   Physical Exam  Constitutional: He is oriented to person, place, and time. He appears well-developed. No distress.  Thin, chronically ill appearing, retching  HENT:  Head: Normocephalic and atraumatic.  R side epistaxis during vomiting  Eyes: Conjunctivae are normal.  Neck: Neck supple.  Cardiovascular: Normal rate, regular rhythm and normal heart sounds.  No murmur heard. Pulmonary/Chest: Effort normal and breath sounds normal.  Abdominal: Soft. Bowel sounds are normal. He exhibits no distension. There is tenderness in the right upper quadrant and epigastric area.  Musculoskeletal: He exhibits no edema.  Neurological: He is alert and oriented to person, place, and time.  Fluent speech  Skin: Skin is warm and dry.  Psychiatric: He has a normal mood and affect. Judgment normal.  Nursing note and vitals reviewed.    ED Treatments /  Results  Labs (all labs ordered are listed, but only abnormal results are displayed) Labs Reviewed  COMPREHENSIVE METABOLIC PANEL - Abnormal; Notable for the following components:      Result Value   Chloride 94 (*)    Glucose, Bld 140 (*)    BUN <5 (*)    Total Protein 8.2 (*)    Anion gap 16 (*)    All other components within normal limits  LIPASE, BLOOD - Abnormal; Notable for the following components:   Lipase 54 (*)    All other components within normal limits  CBC WITH DIFFERENTIAL/PLATELET - Abnormal; Notable for the following components:   WBC 11.4 (*)    Platelets 456 (*)    Neutro Abs 9.1 (*)    All other components within normal limits  I-STAT CG4 LACTIC ACID, ED - Abnormal; Notable for the following components:   Lactic Acid, Venous 2.81 (*)    All other components within normal limits  RAPID URINE DRUG SCREEN, HOSP PERFORMED  ETHANOL  I-STAT TROPONIN, ED  I-STAT CG4 LACTIC ACID, ED    EKG EKG Interpretation  Date/Time:  Tuesday October 16 2018 08:57:03 EST Ventricular Rate:  108 PR Interval:    QRS Duration: 79 QT Interval:  337 QTC Calculation: 452 R Axis:   71 Text Interpretation:  Sinus tachycardia Biatrial enlargement similar to previous Confirmed by Theotis Burrow [phone]) on 10/16/2018 9:29:21 AM   Radiology Ct Angio Chest/abd/pel For Dissection W And/or W/wo  Result Date: 10/16/2018 CLINICAL DATA:  Severe back and chest pain with aortic dissection suspected EXAM: CT ANGIOGRAPHY CHEST, ABDOMEN AND PELVIS TECHNIQUE: Multidetector CT imaging through the chest, abdomen and pelvis was performed using the standard protocol during bolus administration of intravenous contrast. Multiplanar reconstructed images and MIPs were obtained and reviewed to evaluate the vascular anatomy. CONTRAST:  166mL ISOVUE-370 IOPAMIDOL (ISOVUE-370) INJECTION 76% COMPARISON:  Abdomen and pelvis CT 08/12/2018.  Chest CT 10/23/2004 FINDINGS: CTA CHEST FINDINGS Cardiovascular:  Noncontrast phase shows no intramural hematoma in the aorta. No aortic dissection or aneurysm. Normal heart size. No pericardial effusion. The pulmonary arteries are well opacified and no filling defect is seen. Mediastinum/Nodes: Negative for adenopathy or mass. Lungs/Pleura: Moderate debris within right lower lobe bronchi. There is scarring, architectural distortion, and calcification at the right apex, post infectious changes correlating with 2005 chest CT. Musculoskeletal: Avascular necrosis of the left humeral head. Bilateral humeral osteoarthritis. Review of the MIP images confirms the above findings. CTA ABDOMEN AND PELVIS FINDINGS VASCULAR Aorta: Atherosclerotic plaque. No dissection, inflammation, or aneurysm. Celiac: Smooth and widely patent branches. No pseudoaneurysm at the patient's pancreatitis. SMA: Vessels are smooth and widely patent Renals: Single bilateral renal arteries. Both renal arteries are smooth and widely patent IMA: Diffusely patent Inflow: Atherosclerotic plaque at the bilateral common femoral bifurcations. No branch occlusion or dissection.  Veins: Negative Review of the MIP images confirms the above findings. NON-VASCULAR Hepatobiliary: Large caudate and left lobe liver. No definite surface lobulation.No evidence of biliary obstruction or stone. Pancreas: Pancreatic head is expanded and low-density with peripancreatic stranding. Low-density seen previously is not well identified either due to regression or to contrast timing. Evaluation for pancreatic necrosis is limited by arterial timing. Spleen: Unremarkable. Adrenals/Urinary Tract: Negative adrenals. No hydronephrosis or stone. Unremarkable bladder. Stomach/Bowel: No obstruction. Diverticulosis of the terminal ileum. No acute inflammation. Lymphatic: No mass or adenopathy. Reproductive:Negative Other: No ascites or pneumoperitoneum. Musculoskeletal: No acute abnormalities. Avascular necrosis of both femoral heads. Review of the MIP  images confirms the above findings. IMPRESSION: 1. Acute pancreatitis. 2. Negative for aortic dissection. 3. Post infectious right upper lobe scarring. 4. Mucoid impaction within the right lower lobe. 5. Chronic avascular necrosis of the left humeral and bilateral femoral heads. Electronically Signed   By: Monte Fantasia M.D.   On: 10/16/2018 13:11    Procedures Procedures (including critical care time)  Medications Ordered in ED Medications  iopamidol (ISOVUE-370) 76 % injection (has no administration in time range)  oxyCODONE-acetaminophen (PERCOCET/ROXICET) 5-325 MG per tablet 1 tablet (has no administration in time range)  ondansetron (ZOFRAN) 8 mg in sodium chloride 0.9 % 50 mL IVPB (0 mg Intravenous Stopped 10/16/18 1059)  oxymetazoline (AFRIN) 0.05 % nasal spray 2 spray (2 sprays Right Nare Given 10/16/18 0923)  fentaNYL (SUBLIMAZE) injection 100 mcg (100 mcg Intravenous Given 10/16/18 0923)  lactated ringers bolus 1,000 mL (0 mLs Intravenous Stopped 10/16/18 1011)  iopamidol (ISOVUE-370) 76 % injection 100 mL (100 mLs Intravenous Contrast Given 10/16/18 1253)  morphine 4 MG/ML injection 4 mg (4 mg Intravenous Given 10/16/18 1332)  metoCLOPramide (REGLAN) injection 10 mg (10 mg Intravenous Given 10/16/18 1331)  oxyCODONE-acetaminophen (PERCOCET/ROXICET) 5-325 MG per tablet 1 tablet (1 tablet Oral Given 10/16/18 1537)     Initial Impression / Assessment and Plan / ED Course  I have reviewed the triage vital signs and the nursing notes.  Pertinent labs & imaging results that were available during my care of the patient were reviewed by me and considered in my medical decision making (see chart for details).    Pt retching on my initial exam, RUQ/epigastric tenderness noted. Hypertensive, afebrile. EKG with no ischemic changes.   Lab work shows WBC 11.4, glucose 140, normal creatinine, anion gap 16, lipase mildly elevated at 54. Initial lactate mildly elevated, normalized after IVF  bolus.  Because the patient had pain in his back, obtained CTA of chest, abdomen, and pelvis to evaluate aorta.  CTA shows acute pancreatitis but no other acute findings.  His symptoms are consistent with pancreatitis as well as history of alcohol use.  After above medications, the patient was tolerating liquids.  I discussed options of admission for pain management versus management at home.  He preferred going home and understands need for liquid diet, avoidance of alcohol.  I have extensively reviewed return precautions with him and he voiced understanding.  Final Clinical Impressions(s) / ED Diagnoses   Final diagnoses:  Alcohol-induced acute pancreatitis, unspecified complication status    ED Discharge Orders         Ordered    ondansetron (ZOFRAN ODT) 4 MG disintegrating tablet  Every 8 hours PRN     10/16/18 1700           , Wenda Overland, MD 10/16/18 1705

## 2018-10-18 ENCOUNTER — Encounter (HOSPITAL_COMMUNITY): Payer: Self-pay | Admitting: Emergency Medicine

## 2018-10-18 ENCOUNTER — Emergency Department (HOSPITAL_COMMUNITY)
Admission: EM | Admit: 2018-10-18 | Discharge: 2018-10-18 | Disposition: A | Discharge status: Home / Self Care | Payer: Medicare Other | Attending: Emergency Medicine | Admitting: Emergency Medicine

## 2018-10-18 DIAGNOSIS — K2970 Gastritis, unspecified, without bleeding: Secondary | ICD-10-CM | POA: Insufficient documentation

## 2018-10-18 DIAGNOSIS — F17210 Nicotine dependence, cigarettes, uncomplicated: Secondary | ICD-10-CM | POA: Diagnosis not present

## 2018-10-18 DIAGNOSIS — R1084 Generalized abdominal pain: Secondary | ICD-10-CM | POA: Diagnosis present

## 2018-10-18 DIAGNOSIS — K2900 Acute gastritis without bleeding: Secondary | ICD-10-CM

## 2018-10-18 LAB — CBC WITH DIFFERENTIAL/PLATELET
Abs Immature Granulocytes: 0.04 10*3/uL (ref 0.00–0.07)
Basophils Absolute: 0 10*3/uL (ref 0.0–0.1)
Basophils Relative: 0 %
Eosinophils Absolute: 0.1 10*3/uL (ref 0.0–0.5)
Eosinophils Relative: 1 %
HCT: 49.5 % (ref 39.0–52.0)
Hemoglobin: 16.3 g/dL (ref 13.0–17.0)
Immature Granulocytes: 0 %
Lymphocytes Relative: 21 %
Lymphs Abs: 2.2 10*3/uL (ref 0.7–4.0)
MCH: 28.7 pg (ref 26.0–34.0)
MCHC: 32.9 g/dL (ref 30.0–36.0)
MCV: 87.1 fL (ref 80.0–100.0)
Monocytes Absolute: 1 10*3/uL (ref 0.1–1.0)
Monocytes Relative: 10 %
Neutro Abs: 7.1 10*3/uL (ref 1.7–7.7)
Neutrophils Relative %: 68 %
Platelets: 444 10*3/uL — ABNORMAL HIGH (ref 150–400)
RBC: 5.68 MIL/uL (ref 4.22–5.81)
RDW: 13 % (ref 11.5–15.5)
WBC: 10.5 10*3/uL (ref 4.0–10.5)
nRBC: 0 % (ref 0.0–0.2)

## 2018-10-18 LAB — COMPREHENSIVE METABOLIC PANEL
ALT: 24 U/L (ref 0–44)
AST: 31 U/L (ref 15–41)
Albumin: 4.5 g/dL (ref 3.5–5.0)
Alkaline Phosphatase: 75 U/L (ref 38–126)
Anion gap: 20 — ABNORMAL HIGH (ref 5–15)
BUN: 10 mg/dL (ref 6–20)
CO2: 24 mmol/L (ref 22–32)
Calcium: 10.2 mg/dL (ref 8.9–10.3)
Chloride: 92 mmol/L — ABNORMAL LOW (ref 98–111)
Creatinine, Ser: 1.28 mg/dL — ABNORMAL HIGH (ref 0.61–1.24)
GFR calc Af Amer: >60 mL/min (ref >60)
GFR calc non Af Amer: >60 mL/min (ref >60)
Glucose, Bld: 140 mg/dL — ABNORMAL HIGH (ref 70–99)
Potassium: 3.7 mmol/L (ref 3.5–5.1)
Sodium: 136 mmol/L (ref 135–145)
Total Bilirubin: 0.8 mg/dL (ref 0.3–1.2)
Total Protein: 8.7 g/dL — ABNORMAL HIGH (ref 6.5–8.1)

## 2018-10-18 LAB — LIPASE, BLOOD: Lipase: 39 U/L (ref 11–51)

## 2018-10-18 MED ORDER — ALUM & MAG HYDROXIDE-SIMETH 200-200-20 MG/5ML PO SUSP
30.0000 mL | Freq: Once | ORAL | Status: AC
  Administered 2018-10-18: 30 mL via ORAL
  Filled 2018-10-18: qty 30

## 2018-10-18 MED ORDER — HYDROMORPHONE HCL 1 MG/ML IJ SOLN
1.0000 mg | Freq: Once | INTRAMUSCULAR | Status: AC
  Administered 2018-10-18: 1 mg via INTRAVENOUS
  Filled 2018-10-18: qty 1

## 2018-10-18 MED ORDER — SODIUM CHLORIDE 0.9 % IV BOLUS
1000.0000 mL | Freq: Once | INTRAVENOUS | Status: AC
  Administered 2018-10-18: 1000 mL via INTRAVENOUS

## 2018-10-18 MED ORDER — SUCRALFATE 1 GM/10ML PO SUSP: 1.0000 g | Freq: Three times a day (TID) | ORAL | 0 refills | Status: DC

## 2018-10-18 MED ORDER — ONDANSETRON HCL 4 MG/2ML IJ SOLN
4.0000 mg | Freq: Once | INTRAMUSCULAR | Status: AC
  Administered 2018-10-18: 4 mg via INTRAVENOUS
  Filled 2018-10-18: qty 2

## 2018-10-18 NOTE — ED Notes (Signed)
Patient verbalizes understanding of discharge instructions. Opportunity for questioning and answers were provided. Armband removed by staff, pt discharged from ED ambulatory to home.  

## 2018-10-18 NOTE — ED Notes (Signed)
[facility] [phone]

## 2018-10-18 NOTE — Discharge Instructions (Addendum)
Return if any problems,

## 2018-10-18 NOTE — ED Triage Notes (Signed)
Pt reports he drank V8 juice and is now having a pancreatitis episode. Has hx of such. Was here recently for it. Denies diarr or vomiting but is nauseated.

## 2018-10-18 NOTE — ED Provider Notes (Addendum)
[facility] EMERGENCY DEPARTMENT Provider Note   CSN: [id number] Arrival date & time: 10/18/18  1435     History   Chief Complaint Chief Complaint  Patient presents with  . Pancreatitis    HPI Stephen [name] is a 57 y.o. male.  The history is provided by the patient. No language interpreter was used.  Abdominal Pain   This is a new problem. The current episode started less than 1 hour ago. The problem occurs constantly. The pain is associated with eating. The pain is located in the generalized abdominal region. The pain is severe. Associated symptoms include vomiting. Nothing relieves the symptoms.   Pt reports he has a histroy of pancreatitis.  Pt drank a V8 and began having abdominal pain Past Medical History:  Diagnosis Date  . Pancreatitis     Patient Active Problem List   Diagnosis Date Noted  . Hepatitis C antibody test positive 01/19/2018  . Chronic pain of both shoulders 01/17/2018  . Screening for colon cancer 01/17/2018  . Sinus congestion 01/17/2018  . Malnutrition of moderate degree 01/09/2018  . Acute alcoholic pancreatitis 76/28/3151    Past Surgical History:  Procedure Laterality Date  . FRACTURE SURGERY          Home Medications    Prior to Admission medications   Medication Sig Start Date End Date Taking? Authorizing Provider  fexofenadine (ALLEGRA ALLERGY) 180 MG tablet Take 1 tablet (180 mg total) by mouth daily. Patient not taking: Reported on 10/16/2018 01/17/18   Lorella Nimrod, MD  ondansetron (ZOFRAN ODT) 4 MG disintegrating tablet Take 1 tablet (4 mg total) by mouth every 8 (eight) hours as needed for nausea or vomiting. Patient not taking: Reported on 10/18/2018 10/16/18   Little, Wenda Overland, MD  traMADol (ULTRAM) 50 MG tablet Take 1-2 tablets (50-100 mg total) by mouth 3 (three) times daily as needed. Patient not taking: Reported on 10/16/2018 01/30/18   Leandrew Koyanagi, MD    Family History History reviewed. No  pertinent family history.  Social History Social History   Tobacco Use  . Smoking status: Current Every Day Smoker    Types: Cigarettes  . Smokeless tobacco: Never Used  . Tobacco comment: 2 cigs/day  Substance Use Topics  . Alcohol use: Yes    Comment: Beer daily, but none today.  . Drug use: Yes    Types: Marijuana     Allergies   Patient has no known allergies.   Review of Systems Review of Systems  Gastrointestinal: Positive for abdominal pain and vomiting.  All other systems reviewed and are negative.    Physical Exam Updated Vital Signs BP (!) 161/102   Pulse (!) 106   Temp 97.7 F (36.5 C) (Oral)   Resp (!) 36   SpO2 100%   Physical Exam Vitals signs and nursing note reviewed.  Constitutional:      Appearance: Normal appearance. He is well-developed.     Comments: Crying during history  HENT:     Head: Normocephalic.     Nose: Nose normal.     Mouth/Throat:     Mouth: Mucous membranes are moist.  Eyes:     Pupils: Pupils are equal, round, and reactive to light.  Neck:     Musculoskeletal: Normal range of motion.  Cardiovascular:     Rate and Rhythm: Normal rate.  Pulmonary:     Effort: Pulmonary effort is normal.  Abdominal:     General: Bowel sounds are normal.  There is no distension.     Tenderness: There is abdominal tenderness.  Musculoskeletal: Normal range of motion.  Skin:    General: Skin is warm.  Neurological:     General: No focal deficit present.     Mental Status: He is alert and oriented to person, place, and time.  Psychiatric:        Mood and Affect: Mood normal.      ED Treatments / Results  Labs (all labs ordered are listed, but only abnormal results are displayed) Labs Reviewed  COMPREHENSIVE METABOLIC PANEL - Abnormal; Notable for the following components:      Result Value   Chloride 92 (*)    Glucose, Bld 140 (*)    Creatinine, Ser 1.28 (*)    Total Protein 8.7 (*)    Anion gap 20 (*)    All other  components within normal limits  CBC WITH DIFFERENTIAL/PLATELET - Abnormal; Notable for the following components:   Platelets 444 (*)    All other components within normal limits  LIPASE, BLOOD    EKG None  Radiology No results found.  Procedures Procedures (including critical care time)  Medications Ordered in ED Medications  ondansetron (ZOFRAN) injection 4 mg (4 mg Intravenous Given 10/18/18 1449)  HYDROmorphone (DILAUDID) injection 1 mg (1 mg Intravenous Given 10/18/18 1454)  alum & mag hydroxide-simeth (MAALOX/MYLANTA) 200-200-20 MG/5ML suspension 30 mL (30 mLs Oral Given 10/18/18 1454)     Initial Impression / Assessment and Plan / ED Course  I have reviewed the triage vital signs and the nursing notes.  Pertinent labs & imaging results that were available during my care of the patient were reviewed by me and considered in my medical decision making (see chart for details).     MDM   Lipase normal,  I suspect gastritis second to V8.  Pt advised bland diet. Avoid acidic foods.  Pt has elevated heart rate.  Pt reports he has not been drinking much fluid.  Pt given Iv fluids.  Pt advised to try drinking ensure   Final Clinical Impressions(s) / ED Diagnoses   Final diagnoses:  Gastritis without bleeding, unspecified chronicity, unspecified gastritis type    ED Discharge Orders         Ordered    sucralfate (CARAFATE) 1 GM/10ML suspension  3 times daily with meals & bedtime     10/18/18 1728        An After Visit Summary was printed and given to the patient.    Fransico Meadow, PA-C 10/18/18 1730    Fransico Meadow, PA-C 10/18/18 1836    Virgel Manifold, MD 10/19/18 [phone]

## 2018-10-25 ENCOUNTER — Telehealth (INDEPENDENT_AMBULATORY_CARE_PROVIDER_SITE_OTHER): Payer: Self-pay | Admitting: Orthopedic Surgery

## 2018-10-25 NOTE — Telephone Encounter (Signed)
10/10/18 ov note faxed to [facility] [phone]

## 2018-11-13 ENCOUNTER — Other Ambulatory Visit (INDEPENDENT_AMBULATORY_CARE_PROVIDER_SITE_OTHER): Payer: Self-pay | Admitting: Orthopedic Surgery

## 2018-11-13 DIAGNOSIS — M12812 Other specific arthropathies, not elsewhere classified, left shoulder: Secondary | ICD-10-CM

## 2018-11-30 NOTE — Pre-Procedure Instructions (Signed)
Stephen [name]  11/30/2018      [id number] [name], [name] [address] Phone: [phone] Fax: [phone]    Your procedure is scheduled on Tuesday February 4th.  Report to [facility] Admitting at 5:30 A.M.  Call this number if you have problems the morning of surgery:  [phone]   Remember:  Do not eat or drink after midnight.     Take these medicines the morning of surgery with A SIP OF WATER  oxymetazoline (AFRIN)-if needed traMADol (ULTRAM) - if needed   7 days prior to surgery STOP taking any Aspirin(unless otherwise instructed by your surgeon), Aleve, Naproxen, Ibuprofen, Motrin, Advil, Goody's, BC's, all herbal medications, fish oil, and all vitamins    Do not wear jewelry  Do not wear lotions, powders, or colognes, or deodorant.  Do not shave 48 hours prior to surgery.  Men may shave face and neck.  Do not bring valuables to the hospital.  [facility] is not responsible for any belongings or valuables.  Contacts, eyeglasses, hearing aids, dentures or bridgework may not be worn into surgery.  Leave your suitcase in the car.  After surgery it may be brought to your room.  For patients admitted to the hospital, discharge time will be determined by your treatment team.  Patients discharged the day of surgery will not be allowed to drive home.   [name] Preparing For Surgery  Before surgery, you can play an important role. Because skin is not sterile, your skin needs to be as free of germs as possible. You can reduce the number of germs on your skin by washing with CHG (chlorahexidine gluconate) Soap before surgery.  CHG is an antiseptic cleaner which kills germs and bonds with the skin to continue killing germs even after washing.    Oral Hygiene is also important to reduce your risk of infection.  Remember - BRUSH YOUR TEETH THE MORNING OF SURGERY WITH YOUR REGULAR  TOOTHPASTE  Please do not use if you have an allergy to CHG or antibacterial soaps. If your skin becomes reddened/irritated stop using the CHG.  Do not shave (including legs and underarms) for at least 48 hours prior to first CHG shower. It is OK to shave your face.  Please follow these instructions carefully.   1. Shower the NIGHT BEFORE SURGERY and the MORNING OF SURGERY with CHG.   2. If you chose to wash your hair, wash your hair first as usual with your normal shampoo.  3. After you shampoo, rinse your hair and body thoroughly to remove the shampoo.  4. Use CHG as you would any other liquid soap. You can apply CHG directly to the skin and wash gently with a scrungie or a clean washcloth.   5. Apply the CHG Soap to your body ONLY FROM THE NECK DOWN.  Do not use on open wounds or open sores. Avoid contact with your eyes, ears, mouth and genitals (private parts). Wash Face and genitals (private parts)  with your normal soap.  6. Wash thoroughly, paying special attention to the area where your surgery will be performed.  7. Thoroughly rinse your body with warm water from the neck down.  8. DO NOT shower/wash with your normal soap after using and rinsing off the CHG Soap.  9. Pat yourself dry with a CLEAN TOWEL.  10. Wear CLEAN PAJAMAS to bed the night before surgery, wear comfortable clothes  the morning of surgery  11. Place CLEAN SHEETS on your bed the night of your first shower and DO NOT SLEEP WITH PETS.    Day of Surgery: Shower as stated above. Do not apply any deodorants/lotions.  Please wear clean clothes to the hospital/surgery center.   Remember to brush your teeth WITH YOUR REGULAR TOOTHPASTE.   Please read over the following fact sheets that you were given.

## 2018-12-03 ENCOUNTER — Encounter (HOSPITAL_COMMUNITY): Payer: Self-pay

## 2018-12-03 ENCOUNTER — Other Ambulatory Visit: Payer: Self-pay

## 2018-12-03 ENCOUNTER — Encounter (HOSPITAL_COMMUNITY)
Admission: RE | Admit: 2018-12-03 | Discharge: 2018-12-03 | Disposition: A | Discharge status: Home / Self Care | Payer: No Typology Code available for payment source | Source: Ambulatory Visit | Attending: Orthopedic Surgery | Admitting: Orthopedic Surgery

## 2018-12-03 DIAGNOSIS — Z01812 Encounter for preprocedural laboratory examination: Secondary | ICD-10-CM | POA: Insufficient documentation

## 2018-12-03 LAB — COMPREHENSIVE METABOLIC PANEL
ALBUMIN: 4 g/dL (ref 3.5–5.0)
ALT: 37 U/L (ref 0–44)
AST: 39 U/L (ref 15–41)
Alkaline Phosphatase: 120 U/L (ref 38–126)
Anion gap: 10 (ref 5–15)
BUN: 7 mg/dL (ref 6–20)
CHLORIDE: 105 mmol/L (ref 98–111)
CO2: 23 mmol/L (ref 22–32)
Calcium: 9.2 mg/dL (ref 8.9–10.3)
Creatinine, Ser: 0.86 mg/dL (ref 0.61–1.24)
GFR calc Af Amer: >60 mL/min (ref >60)
GFR calc non Af Amer: >60 mL/min (ref >60)
Glucose, Bld: 81 mg/dL (ref 70–99)
Potassium: 3.6 mmol/L (ref 3.5–5.1)
Sodium: 138 mmol/L (ref 135–145)
TOTAL PROTEIN: 7.9 g/dL (ref 6.5–8.1)
Total Bilirubin: 0.9 mg/dL (ref 0.3–1.2)

## 2018-12-03 LAB — CBC
HCT: 44.2 % (ref 39.0–52.0)
HEMOGLOBIN: 14.5 g/dL (ref 13.0–17.0)
MCH: 28.7 pg (ref 26.0–34.0)
MCHC: 32.8 g/dL (ref 30.0–36.0)
MCV: 87.4 fL (ref 80.0–100.0)
Platelets: 393 10*3/uL (ref 150–400)
RBC: 5.06 MIL/uL (ref 4.22–5.81)
RDW: 13.9 % (ref 11.5–15.5)
WBC: 6.8 10*3/uL (ref 4.0–10.5)
nRBC: 0 % (ref 0.0–0.2)

## 2018-12-03 LAB — URINALYSIS, ROUTINE W REFLEX MICROSCOPIC
Bacteria, UA: NONE SEEN
Bilirubin Urine: NEGATIVE
Glucose, UA: NEGATIVE mg/dL
Ketones, ur: NEGATIVE mg/dL
Leukocytes, UA: NEGATIVE
Nitrite: NEGATIVE
Protein, ur: NEGATIVE mg/dL
Specific Gravity, Urine: 1.018 (ref 1.005–1.030)
pH: 5 (ref 5.0–8.0)

## 2018-12-03 LAB — SURGICAL PCR SCREEN
MRSA, PCR: NEGATIVE
Staphylococcus aureus: NEGATIVE

## 2018-12-03 NOTE — Progress Notes (Signed)
PCP - Dr. Lorella Nimrod [facility] Cardiologist - denies  Chest x-ray - N/A EKG - 10/16/18 Stress Test - denies ECHO - denies Cardiac Cath - denies  Sleep Study - denies  Blood Thinner Instructions: N/A Aspirin Instructions:N/A  Anesthesia review: No  Patient denies shortness of breath, fever, cough and chest pain at PAT appointment   Patient verbalized understanding of instructions that were given to them at the PAT appointment. Patient was also instructed that they will need to review over the PAT instructions again at home before surgery.

## 2018-12-04 LAB — URINE CULTURE: Culture: NO GROWTH

## 2018-12-10 NOTE — Anesthesia Preprocedure Evaluation (Addendum)
Anesthesia Evaluation  Patient identified by MRN, date of birth, ID band Patient awake    Reviewed: Allergy & Precautions, NPO status , Patient's Chart, lab work & pertinent test results  History of Anesthesia Complications Negative for: history of anesthetic complications  Airway Mallampati: II  TM Distance: >3 FB Neck ROM: Full    Dental  (+) Dental Advisory Given, Upper Dentures   Pulmonary Current Smoker,    breath sounds clear to auscultation       Cardiovascular Exercise Tolerance: Good negative cardio ROS   Rhythm:Regular Rate:Normal     Neuro/Psych negative neurological ROS  negative psych ROS   GI/Hepatic negative GI ROS, (+)     substance abuse  alcohol use and marijuana use,   Endo/Other  negative endocrine ROS  Renal/GU negative Renal ROS     Musculoskeletal  (+) Arthritis ,   Abdominal   Peds  Hematology negative hematology ROS (+)   Anesthesia Other Findings   Reproductive/Obstetrics                            Anesthesia Physical Anesthesia Plan  ASA: III  Anesthesia Plan: General   Post-op Pain Management:  [facility] for Post-op pain   Induction: Intravenous  PONV Risk Score and Plan: 3 and Treatment may vary due to age or medical condition, Ondansetron, Dexamethasone and Midazolam  Airway Management Planned: Oral ETT  Additional Equipment: None  Intra-op Plan:   Post-operative Plan: Extubation in OR  Informed Consent: I have reviewed the patients History and Physical, chart, labs and discussed the procedure including the risks, benefits and alternatives for the proposed anesthesia with the patient or authorized representative who has indicated his/her understanding and acceptance.     Dental advisory given  Plan Discussed with: CRNA and Anesthesiologist  Anesthesia Plan Comments:        Anesthesia Quick Evaluation

## 2018-12-11 ENCOUNTER — Inpatient Hospital Stay (HOSPITAL_COMMUNITY): Payer: No Typology Code available for payment source | Admitting: Anesthesiology

## 2018-12-11 ENCOUNTER — Other Ambulatory Visit: Payer: Self-pay

## 2018-12-11 ENCOUNTER — Inpatient Hospital Stay (HOSPITAL_COMMUNITY)
Admission: RE | Admit: 2018-12-11 | Discharge: 2018-12-12 | DRG: 483 | Disposition: A | Discharge status: Home Health | Payer: No Typology Code available for payment source | Attending: Orthopedic Surgery | Admitting: Orthopedic Surgery

## 2018-12-11 ENCOUNTER — Encounter (HOSPITAL_COMMUNITY): Payer: Self-pay

## 2018-12-11 ENCOUNTER — Inpatient Hospital Stay (HOSPITAL_COMMUNITY): Payer: No Typology Code available for payment source

## 2018-12-11 ENCOUNTER — Encounter (HOSPITAL_COMMUNITY)
Admission: RE | Disposition: A | Discharge status: Home Health | Payer: Self-pay | Source: Home / Self Care | Attending: Orthopedic Surgery

## 2018-12-11 DIAGNOSIS — M12812 Other specific arthropathies, not elsewhere classified, left shoulder: Secondary | ICD-10-CM

## 2018-12-11 DIAGNOSIS — M19012 Primary osteoarthritis, left shoulder: Secondary | ICD-10-CM | POA: Diagnosis present

## 2018-12-11 DIAGNOSIS — F17210 Nicotine dependence, cigarettes, uncomplicated: Secondary | ICD-10-CM | POA: Diagnosis present

## 2018-12-11 DIAGNOSIS — Z8719 Personal history of other diseases of the digestive system: Secondary | ICD-10-CM | POA: Diagnosis not present

## 2018-12-11 DIAGNOSIS — M75102 Unspecified rotator cuff tear or rupture of left shoulder, not specified as traumatic: Secondary | ICD-10-CM | POA: Diagnosis present

## 2018-12-11 DIAGNOSIS — Z79899 Other long term (current) drug therapy: Secondary | ICD-10-CM

## 2018-12-11 DIAGNOSIS — M87812 Other osteonecrosis, left shoulder: Secondary | ICD-10-CM | POA: Diagnosis present

## 2018-12-11 DIAGNOSIS — M19019 Primary osteoarthritis, unspecified shoulder: Secondary | ICD-10-CM | POA: Diagnosis present

## 2018-12-11 HISTORY — PX: REVERSE SHOULDER ARTHROPLASTY: SHX5054

## 2018-12-11 SURGERY — ARTHROPLASTY, SHOULDER, TOTAL, REVERSE
Anesthesia: General | Laterality: Left

## 2018-12-11 MED ORDER — METHOCARBAMOL 500 MG PO TABS
ORAL_TABLET | ORAL | Status: AC
  Filled 2018-12-11: qty 1

## 2018-12-11 MED ORDER — SUGAMMADEX SODIUM 200 MG/2ML IV SOLN
INTRAVENOUS | Status: DC | PRN
  Administered 2018-12-11: 150 mg via INTRAVENOUS

## 2018-12-11 MED ORDER — PROPOFOL 10 MG/ML IV BOLUS
INTRAVENOUS | Status: AC
  Filled 2018-12-11: qty 40

## 2018-12-11 MED ORDER — ONDANSETRON HCL 4 MG/2ML IJ SOLN
INTRAMUSCULAR | Status: DC | PRN
  Administered 2018-12-11: 4 mg via INTRAVENOUS

## 2018-12-11 MED ORDER — OXYCODONE HCL 5 MG PO TABS
5.0000 mg | ORAL_TABLET | ORAL | Status: DC | PRN
  Administered 2018-12-11 (×2): 10 mg via ORAL
  Administered 2018-12-12: 5 mg via ORAL
  Administered 2018-12-12 (×3): 10 mg via ORAL
  Filled 2018-12-11 (×4): qty 2
  Filled 2018-12-11: qty 1

## 2018-12-11 MED ORDER — PROPOFOL 10 MG/ML IV BOLUS
INTRAVENOUS | Status: DC | PRN
  Administered 2018-12-11: 150 mg via INTRAVENOUS
  Administered 2018-12-11: 30 mg via INTRAVENOUS

## 2018-12-11 MED ORDER — VANCOMYCIN HCL 1000 MG IV SOLR
INTRAVENOUS | Status: DC | PRN
  Administered 2018-12-11: 1000 mg

## 2018-12-11 MED ORDER — LACTATED RINGERS IV SOLN
INTRAVENOUS | Status: DC | PRN
  Administered 2018-12-11 (×2): via INTRAVENOUS

## 2018-12-11 MED ORDER — FENTANYL CITRATE (PF) 100 MCG/2ML IJ SOLN
25.0000 ug | INTRAMUSCULAR | Status: DC | PRN
  Administered 2018-12-11: 25 ug via INTRAVENOUS
  Administered 2018-12-11: 50 ug via INTRAVENOUS

## 2018-12-11 MED ORDER — FENTANYL CITRATE (PF) 250 MCG/5ML IJ SOLN
INTRAMUSCULAR | Status: DC | PRN
  Administered 2018-12-11: 50 ug via INTRAVENOUS
  Administered 2018-12-11 (×2): 25 ug via INTRAVENOUS
  Administered 2018-12-11: 50 ug via INTRAVENOUS

## 2018-12-11 MED ORDER — TRAMADOL HCL 50 MG PO TABS
50.0000 mg | ORAL_TABLET | Freq: Four times a day (QID) | ORAL | Status: DC
  Administered 2018-12-11 – 2018-12-12 (×4): 50 mg via ORAL
  Filled 2018-12-11 (×4): qty 1

## 2018-12-11 MED ORDER — ONDANSETRON HCL 4 MG/2ML IJ SOLN
INTRAMUSCULAR | Status: AC
  Filled 2018-12-11: qty 2

## 2018-12-11 MED ORDER — PHENYLEPHRINE 40 MCG/ML (10ML) SYRINGE FOR IV PUSH (FOR BLOOD PRESSURE SUPPORT)
PREFILLED_SYRINGE | INTRAVENOUS | Status: AC
  Filled 2018-12-11: qty 10

## 2018-12-11 MED ORDER — ONDANSETRON HCL 4 MG/2ML IJ SOLN: 4.0000 mg | Freq: Once | INTRAMUSCULAR | Status: DC | PRN

## 2018-12-11 MED ORDER — CHLORHEXIDINE GLUCONATE 4 % EX LIQD: 60.0000 mL | Freq: Once | CUTANEOUS | Status: DC

## 2018-12-11 MED ORDER — CEFAZOLIN SODIUM-DEXTROSE 2-4 GM/100ML-% IV SOLN
2.0000 g | INTRAVENOUS | Status: AC
  Administered 2018-12-11: 2 g via INTRAVENOUS
  Filled 2018-12-11: qty 100

## 2018-12-11 MED ORDER — ACETAMINOPHEN 325 MG PO TABS: 325.0000 mg | ORAL_TABLET | Freq: Four times a day (QID) | ORAL | Status: DC | PRN

## 2018-12-11 MED ORDER — OXYCODONE HCL 5 MG PO TABS
ORAL_TABLET | ORAL | Status: AC
  Filled 2018-12-11: qty 2

## 2018-12-11 MED ORDER — OXYCODONE HCL 5 MG/5ML PO SOLN: 5.0000 mg | Freq: Once | ORAL | Status: DC | PRN

## 2018-12-11 MED ORDER — ONDANSETRON HCL 4 MG/2ML IJ SOLN: 4.0000 mg | Freq: Four times a day (QID) | INTRAMUSCULAR | Status: DC | PRN

## 2018-12-11 MED ORDER — GLYCOPYRROLATE PF 0.2 MG/ML IJ SOSY
PREFILLED_SYRINGE | INTRAMUSCULAR | Status: AC
  Filled 2018-12-11: qty 1

## 2018-12-11 MED ORDER — VANCOMYCIN HCL 1000 MG IV SOLR
INTRAVENOUS | Status: AC
  Filled 2018-12-11: qty 1000

## 2018-12-11 MED ORDER — METHOCARBAMOL 1000 MG/10ML IJ SOLN
500.0000 mg | Freq: Four times a day (QID) | INTRAVENOUS | Status: DC | PRN
  Filled 2018-12-11: qty 5

## 2018-12-11 MED ORDER — 0.9 % SODIUM CHLORIDE (POUR BTL) OPTIME
TOPICAL | Status: DC | PRN
  Administered 2018-12-11 (×5): 1000 mL

## 2018-12-11 MED ORDER — CEFAZOLIN SODIUM-DEXTROSE 1-4 GM/50ML-% IV SOLN
1.0000 g | Freq: Four times a day (QID) | INTRAVENOUS | Status: AC
  Administered 2018-12-11 – 2018-12-12 (×3): 1 g via INTRAVENOUS
  Filled 2018-12-11 (×3): qty 50

## 2018-12-11 MED ORDER — ASPIRIN EC 81 MG PO TBEC
81.0000 mg | DELAYED_RELEASE_TABLET | Freq: Two times a day (BID) | ORAL | Status: DC
  Administered 2018-12-11 – 2018-12-12 (×3): 81 mg via ORAL
  Filled 2018-12-11 (×3): qty 1

## 2018-12-11 MED ORDER — HYDROMORPHONE HCL 1 MG/ML IJ SOLN
0.5000 mg | INTRAMUSCULAR | Status: DC | PRN
  Administered 2018-12-11: 0.5 mg via INTRAVENOUS
  Filled 2018-12-11: qty 0.5

## 2018-12-11 MED ORDER — METOCLOPRAMIDE HCL 5 MG PO TABS: 5.0000 mg | ORAL_TABLET | Freq: Three times a day (TID) | ORAL | Status: DC | PRN

## 2018-12-11 MED ORDER — BUPIVACAINE LIPOSOME 1.3 % IJ SUSP
INTRAMUSCULAR | Status: DC | PRN
  Administered 2018-12-11: 10 mL via PERINEURAL

## 2018-12-11 MED ORDER — PHENOL 1.4 % MT LIQD: 1.0000 | OROMUCOSAL | Status: DC | PRN

## 2018-12-11 MED ORDER — SODIUM CHLORIDE 0.9 % IV SOLN
INTRAVENOUS | Status: DC | PRN
  Administered 2018-12-11: 10:00:00 via INTRAVENOUS
  Administered 2018-12-11: 50 ug/min via INTRAVENOUS

## 2018-12-11 MED ORDER — GLYCOPYRROLATE PF 0.2 MG/ML IJ SOSY
PREFILLED_SYRINGE | INTRAMUSCULAR | Status: DC | PRN
  Administered 2018-12-11 (×2): .1 mg via INTRAVENOUS

## 2018-12-11 MED ORDER — DEXAMETHASONE SODIUM PHOSPHATE 10 MG/ML IJ SOLN
INTRAMUSCULAR | Status: DC | PRN
  Administered 2018-12-11: 10 mg via INTRAVENOUS

## 2018-12-11 MED ORDER — MIDAZOLAM HCL 2 MG/2ML IJ SOLN
INTRAMUSCULAR | Status: DC | PRN
  Administered 2018-12-11: 2 mg via INTRAVENOUS

## 2018-12-11 MED ORDER — FENTANYL CITRATE (PF) 250 MCG/5ML IJ SOLN
INTRAMUSCULAR | Status: AC
  Filled 2018-12-11: qty 5

## 2018-12-11 MED ORDER — LIDOCAINE 2% (20 MG/ML) 5 ML SYRINGE
INTRAMUSCULAR | Status: DC | PRN
  Administered 2018-12-11: 40 mg via INTRAVENOUS

## 2018-12-11 MED ORDER — METOCLOPRAMIDE HCL 5 MG/ML IJ SOLN: 5.0000 mg | Freq: Three times a day (TID) | INTRAMUSCULAR | Status: DC | PRN

## 2018-12-11 MED ORDER — ONDANSETRON HCL 4 MG PO TABS: 4.0000 mg | ORAL_TABLET | Freq: Four times a day (QID) | ORAL | Status: DC | PRN

## 2018-12-11 MED ORDER — PHENYLEPHRINE 40 MCG/ML (10ML) SYRINGE FOR IV PUSH (FOR BLOOD PRESSURE SUPPORT)
PREFILLED_SYRINGE | INTRAVENOUS | Status: DC | PRN
  Administered 2018-12-11: 120 ug via INTRAVENOUS

## 2018-12-11 MED ORDER — DOCUSATE SODIUM 100 MG PO CAPS
100.0000 mg | ORAL_CAPSULE | Freq: Two times a day (BID) | ORAL | Status: DC
  Administered 2018-12-11 – 2018-12-12 (×2): 100 mg via ORAL
  Filled 2018-12-11 (×2): qty 1

## 2018-12-11 MED ORDER — MIDAZOLAM HCL 2 MG/2ML IJ SOLN
INTRAMUSCULAR | Status: AC
  Filled 2018-12-11: qty 2

## 2018-12-11 MED ORDER — ROCURONIUM BROMIDE 10 MG/ML (PF) SYRINGE
PREFILLED_SYRINGE | INTRAVENOUS | Status: DC | PRN
  Administered 2018-12-11: 50 mg via INTRAVENOUS

## 2018-12-11 MED ORDER — MENTHOL 3 MG MT LOZG: 1.0000 | LOZENGE | OROMUCOSAL | Status: DC | PRN

## 2018-12-11 MED ORDER — DEXAMETHASONE SODIUM PHOSPHATE 10 MG/ML IJ SOLN
INTRAMUSCULAR | Status: AC
  Filled 2018-12-11: qty 1

## 2018-12-11 MED ORDER — OXYCODONE HCL 5 MG PO TABS: 5.0000 mg | ORAL_TABLET | Freq: Once | ORAL | Status: DC | PRN

## 2018-12-11 MED ORDER — METHOCARBAMOL 500 MG PO TABS
500.0000 mg | ORAL_TABLET | Freq: Four times a day (QID) | ORAL | Status: DC | PRN
  Administered 2018-12-11 – 2018-12-12 (×4): 500 mg via ORAL
  Filled 2018-12-11 (×3): qty 1

## 2018-12-11 MED ORDER — FENTANYL CITRATE (PF) 100 MCG/2ML IJ SOLN
INTRAMUSCULAR | Status: AC
  Filled 2018-12-11: qty 2

## 2018-12-11 MED ORDER — BUPIVACAINE HCL (PF) 0.5 % IJ SOLN
INTRAMUSCULAR | Status: DC | PRN
  Administered 2018-12-11: 15 mL via PERINEURAL

## 2018-12-11 SURGICAL SUPPLY — 78 items
ALCOHOL 70% 16 OZ (MISCELLANEOUS) ×3 IMPLANT
BASEPLATE GLENOSPHERE 25 (Plate) ×1 IMPLANT
BASEPLATE GLENOSPHERE 25MM (Plate) ×1 IMPLANT
BEARING HUMERAL SHLDER 36M STD (Shoulder) IMPLANT
BIT DRILL TWIST 2.7 (BIT) ×1 IMPLANT
BIT DRILL TWIST 2.7MM (BIT) ×1
BLADE SAW SGTL 13X75X1.27 (BLADE) ×3 IMPLANT
BRNG HUM STD 36 RVRS SHLDR (Shoulder) ×1 IMPLANT
CHLORAPREP W/TINT 26ML (MISCELLANEOUS) ×5 IMPLANT
CLOSURE STERI-STRIP 1/2X4 (GAUZE/BANDAGES/DRESSINGS) ×1
CLOSURE WOUND 1/2 X4 (GAUZE/BANDAGES/DRESSINGS) ×1
CLSR STERI-STRIP ANTIMIC 1/2X4 (GAUZE/BANDAGES/DRESSINGS) ×1 IMPLANT
COVER SURGICAL LIGHT HANDLE (MISCELLANEOUS) ×3 IMPLANT
COVER WAND RF STERILE (DRAPES) ×3 IMPLANT
DRAPE INCISE IOBAN 66X45 STRL (DRAPES) ×3 IMPLANT
DRAPE U-SHAPE 47X51 STRL (DRAPES) ×6 IMPLANT
DRESSING AQUACEL AG SP 3.5X10 (GAUZE/BANDAGES/DRESSINGS) IMPLANT
DRSG AQUACEL AG ADV 3.5X10 (GAUZE/BANDAGES/DRESSINGS) ×3 IMPLANT
DRSG AQUACEL AG SP 3.5X10 (GAUZE/BANDAGES/DRESSINGS) ×3
ELECT BLADE 4.0 EZ CLEAN MEGAD (MISCELLANEOUS) ×3
ELECT REM PT RETURN 9FT ADLT (ELECTROSURGICAL) ×3
ELECTRODE BLDE 4.0 EZ CLN MEGD (MISCELLANEOUS) ×1 IMPLANT
ELECTRODE REM PT RTRN 9FT ADLT (ELECTROSURGICAL) ×1 IMPLANT
GAUZE SPONGE 4X4 12PLY STRL LF (GAUZE/BANDAGES/DRESSINGS) ×3 IMPLANT
GLENOID SPHERE 36MM CVD +3 (Orthopedic Implant) ×2 IMPLANT
GLOVE BIOGEL PI IND STRL 7.5 (GLOVE) ×1 IMPLANT
GLOVE BIOGEL PI IND STRL 8 (GLOVE) ×1 IMPLANT
GLOVE BIOGEL PI INDICATOR 7.5 (GLOVE) ×2
GLOVE BIOGEL PI INDICATOR 8 (GLOVE) ×2
GLOVE ECLIPSE 7.0 STRL STRAW (GLOVE) ×3 IMPLANT
GLOVE SURG ORTHO 8.0 STRL STRW (GLOVE) ×3 IMPLANT
GOWN STRL REUS W/ TWL LRG LVL3 (GOWN DISPOSABLE) ×2 IMPLANT
GOWN STRL REUS W/ TWL XL LVL3 (GOWN DISPOSABLE) IMPLANT
GOWN STRL REUS W/TWL LRG LVL3 (GOWN DISPOSABLE) ×6
GOWN STRL REUS W/TWL XL LVL3 (GOWN DISPOSABLE)
HYDROGEN PEROXIDE 16OZ (MISCELLANEOUS) ×3 IMPLANT
KIT BASIN OR (CUSTOM PROCEDURE TRAY) ×3 IMPLANT
KIT TURNOVER KIT B (KITS) ×3 IMPLANT
LOOP VESSEL MAXI BLUE (MISCELLANEOUS) ×3 IMPLANT
MANIFOLD NEPTUNE II (INSTRUMENTS) ×3 IMPLANT
MODEL GLENOID TOTAL SIGNATURE (SYSTAGENIX WOUND MANAGEMENT) ×2 IMPLANT
NDL SUT 6 .5 CRC .975X.05 MAYO (NEEDLE) ×1 IMPLANT
NEEDLE MAYO TAPER (NEEDLE) ×3
NS IRRIG 1000ML POUR BTL (IV SOLUTION) ×3 IMPLANT
PACK SHOULDER (CUSTOM PROCEDURE TRAY) ×3 IMPLANT
PAD ARMBOARD 7.5X6 YLW CONV (MISCELLANEOUS) ×6 IMPLANT
PIN STEINMANN THREADED TIP (PIN) ×2 IMPLANT
PIN THREADED REVERSE (PIN) ×2 IMPLANT
RETRIEVER SUT HEWSON (MISCELLANEOUS) ×3 IMPLANT
SCREW BONE LOCKING 4.75X30X3.5 (Screw) ×4 IMPLANT
SCREW BONE STRL 6.5MMX25MM (Screw) ×2 IMPLANT
SCREW CENTRAL 6.5X20MM (Screw) IMPLANT
SCREW LOCKING 4.75MMX15MM (Screw) ×4 IMPLANT
SHOULDER HUMERAL BEAR 36M STD (Shoulder) ×3 IMPLANT
SLING ARM IMMOBILIZER LRG (SOFTGOODS) ×3 IMPLANT
SOLUTION BETADINE 4OZ (MISCELLANEOUS) ×3 IMPLANT
SPONGE LAP 18X18 X RAY DECT (DISPOSABLE) ×3 IMPLANT
STEM SHOULDER (Stem) ×2 IMPLANT
STRIP CLOSURE SKIN 1/2X4 (GAUZE/BANDAGES/DRESSINGS) ×2 IMPLANT
SUCTION FRAZIER HANDLE 10FR (MISCELLANEOUS) ×2
SUCTION TUBE FRAZIER 10FR DISP (MISCELLANEOUS) ×1 IMPLANT
SUT BROADBAND TAPE 2PK 1.5 (SUTURE) ×4 IMPLANT
SUT FIBERWIRE #2 38 T-5 BLUE (SUTURE)
SUT MAXBRAID (SUTURE) IMPLANT
SUT MNCRL AB 3-0 PS2 18 (SUTURE) ×3 IMPLANT
SUT SILK 2 0 TIES 10X30 (SUTURE) ×3 IMPLANT
SUT VIC AB 0 CT1 27 (SUTURE) ×12
SUT VIC AB 0 CT1 27XBRD ANBCTR (SUTURE) ×4 IMPLANT
SUT VIC AB 1 CT1 27 (SUTURE) ×6
SUT VIC AB 1 CT1 27XBRD ANBCTR (SUTURE) ×2 IMPLANT
SUT VIC AB 2-0 CT1 27 (SUTURE) ×9
SUT VIC AB 2-0 CT1 TAPERPNT 27 (SUTURE) ×3 IMPLANT
SUT VICRYL 0 AB UR-6 (SUTURE) ×9 IMPLANT
SUTURE FIBERWR #2 38 T-5 BLUE (SUTURE) IMPLANT
TOWEL OR 17X26 10 PK STRL BLUE (TOWEL DISPOSABLE) ×3 IMPLANT
TRAY FOLEY BAG SILVER LF 16FR (CATHETERS) IMPLANT
TRAY HUM MINI SHOULDER +3 40 (Joint) ×2 IMPLANT
WATER STERILE IRR 1000ML POUR (IV SOLUTION) ×3 IMPLANT

## 2018-12-11 NOTE — H&P (Signed)
Stephen [name] is an 58 y.o. male.   Chief Complaint: Left shoulder pain HPI: Stephen [name] is a 56 year old patient with left shoulder pain.  He has a long history of left shoulder pain.  MRI scan shows avascular necrosis with significant atrophy of the rotator cuff muscles.  He has multiple other medical comorbidities.  He presents now for operative management after explanation of risks and benefits.  We will plan to do patient specific instrumentation as a guide for reverse shoulder replacement.  Past Medical History:  Diagnosis Date  . Pancreatitis     Past Surgical History:  Procedure Laterality Date  . FRACTURE SURGERY      History reviewed. No pertinent family history. Social History:  reports that he has been smoking cigarettes. He has never used smokeless tobacco. He reports current alcohol use. He reports current drug use. Drug: Marijuana.  Allergies: No Known Allergies  Medications Prior to Admission  Medication Sig Dispense Refill  . oxymetazoline (AFRIN) 0.05 % nasal spray Place 1 spray into both nostrils 2 (two) times daily as needed for congestion.    . traMADol (ULTRAM) 50 MG tablet Take 1-2 tablets (50-100 mg total) by mouth 3 (three) times daily as needed. (Patient taking differently: Take 50 mg by mouth 2 (two) times daily. ) 30 tablet 2  . fexofenadine (ALLEGRA ALLERGY) 180 MG tablet Take 1 tablet (180 mg total) by mouth daily. (Patient not taking: Reported on 10/16/2018) 30 tablet 2  . ondansetron (ZOFRAN ODT) 4 MG disintegrating tablet Take 1 tablet (4 mg total) by mouth every 8 (eight) hours as needed for nausea or vomiting. (Patient not taking: Reported on 10/18/2018) 8 tablet 0  . sucralfate (CARAFATE) 1 GM/10ML suspension Take 10 mLs (1 g total) by mouth 4 (four) times daily -  with meals and at bedtime. (Patient not taking: Reported on 11/22/2018) 420 mL 0    No results found for this or any previous visit (from the past 48 hour(s)). No results found.  Review of  Systems  Musculoskeletal: Positive for joint pain.  All other systems reviewed and are negative.   Blood pressure (!) 173/91, pulse 70, temperature 97.9 F (36.6 C), temperature source Oral, resp. rate 18, height 5\' 7"  (1.702 m), weight 50.8 kg, SpO2 100 %. Physical Exam  Constitutional: He appears well-developed.  HENT:  Head: Normocephalic.  Eyes: Pupils are equal, round, and reactive to light.  Neck: Normal range of motion.  Cardiovascular: Normal rate.  Respiratory: Effort normal.  Neurological: He is alert.  Skin: Skin is warm.  Psychiatric: He has a normal mood and affect.  Examination of the left shoulder demonstrates significantly diminished active and passive range of motion.  There is coarse grinding with passive range of motion.  Motor or sensory function to the hand is intact.  No warmth in the shoulder.  No effusion in the shoulder joint.  Assessment/Plan Impression is left shoulder rotator cuff arthropathy and avascular necrosis.  Plan is reverse shoulder replacement.  The risk and benefits are discussed including but not limited to infection nerve vessel damage incomplete restoration of function and incomplete pain relief.  Also longevity of implant is potentially in question based on his young age.  Nonetheless I think this is his best option for pain relief and increased function.  The risk and benefits are discussed and the patient understands.  All questions answered.  Anderson Malta, MD 12/11/2018, 7:18 AM

## 2018-12-11 NOTE — Anesthesia Postprocedure Evaluation (Signed)
Anesthesia Post Note  Patient: Stephen [name]  Procedure(s) Performed: LEFT REVERSE SHOULDER ARTHROPLASTY (Left )     Patient location during evaluation: PACU Anesthesia Type: General Level of consciousness: awake and alert Pain management: pain level controlled Vital Signs Assessment: post-procedure vital signs reviewed and stable Respiratory status: spontaneous breathing, nonlabored ventilation and respiratory function stable Cardiovascular status: blood pressure returned to baseline and stable Postop Assessment: no apparent nausea or vomiting Anesthetic complications: no    Last Vitals:  Vitals:   12/11/18 1145 12/11/18 1211  BP:  (!) 159/86  Pulse: 86 (!) 102  Resp: 16 19  Temp: (!) 36.1 C (!) 36.4 C  SpO2: 98%     Last Pain:  Vitals:   12/11/18 1502  TempSrc:   PainSc: [place] Cem Kosman

## 2018-12-11 NOTE — Transfer of Care (Signed)
Immediate Anesthesia Transfer of Care Note  Patient: Stephen [name]  Procedure(s) Performed: LEFT REVERSE SHOULDER ARTHROPLASTY (Left )  Patient Location: PACU  Anesthesia Type:GA combined with regional for post-op pain  Level of Consciousness: awake, alert  and oriented  Airway & Oxygen Therapy: Patient Spontanous Breathing and Patient connected to nasal cannula oxygen  Post-op Assessment: Report given to RN and Post -op Vital signs reviewed and stable  Post vital signs: Reviewed and stable  Last Vitals:  Vitals Value Taken Time  BP 144/85 12/11/2018 10:55 AM  Temp 36.4 C 12/11/2018 11:00 AM  Pulse 103 12/11/2018 11:07 AM  Resp 17 12/11/2018 11:07 AM  SpO2 98 % 12/11/2018 11:07 AM  Vitals shown include unvalidated device data.  Last Pain:  Vitals:   12/11/18 0625  TempSrc:   PainSc: 9       Patients Stated Pain Goal: 2 (11/94/17 4081)  Complications: No apparent anesthesia complications

## 2018-12-11 NOTE — Progress Notes (Signed)
Orthopedic Tech Progress Note Patient Details:  KEBRON PULSE 02-25-1961 [id number]  Ortho Devices Type of Ortho Device: Shoulder immobilizer Ortho Device/Splint Interventions: Application   Post Interventions Patient Tolerated: Well Instructions Provided: Care of device   [place] Pink 12/11/2018, 11:57 AM

## 2018-12-11 NOTE — Brief Op Note (Signed)
12/11/2018  10:49 AM  PATIENT:  Stephen [name]  58 y.o. male  PRE-OPERATIVE DIAGNOSIS:  left rotator cuff arthropathy  POST-OPERATIVE DIAGNOSIS:  left rotator cuff arthropathy  PROCEDURE:  Procedure(s): LEFT REVERSE SHOULDER ARTHROPLASTY  SURGEON:  Surgeon(s): Marlou Sa, Tonna Corner, MD  ASSISTANT: Laure Kidney, RNFA  ANESTHESIA:   general  EBL: 100 ml    Total I/O In: 1000 [I.V.:1000] Out: 150 [Blood:150]  BLOOD ADMINISTERED: none  DRAINS: none   LOCAL MEDICATIONS USED:  none  SPECIMEN:  No Specimen  COUNTS:  YES  TOURNIQUET:  * No tourniquets in log *  DICTATION: .Other Dictation: Dictation Number [id number]  PLAN OF CARE: Admit to inpatient   PATIENT DISPOSITION:  PACU - hemodynamically stable

## 2018-12-11 NOTE — Anesthesia Procedure Notes (Signed)
Anesthesia Regional Block: Interscalene brachial plexus block   Pre-Anesthetic Checklist: ,, timeout performed, Correct Patient, Correct Site, Correct Laterality, Correct Procedure, Correct Position, site marked, Risks and benefits discussed,  Surgical consent,  Pre-op evaluation,  At surgeon's request and post-op pain management  Laterality: Left  Prep: chloraprep       Needles:  Injection technique: Single-shot  Needle Type: Echogenic Needle     Needle Length: 5cm  Needle Gauge: 21     Additional Needles:   Narrative:  Start time: 12/11/2018 7:08 AM End time: 12/11/2018 7:13 AM Injection made incrementally with aspirations every 5 mL.  Performed by: Personally  Anesthesiologist: Audry Pili, MD  Additional Notes: No pain on injection. No increased resistance to injection. Injection made in 5cc increments. Good needle visualization. Patient tolerated the procedure well.

## 2018-12-11 NOTE — Op Note (Signed)
NAME: Stephen [name], Stephen A. MEDICAL RECORD [id number] ACCOUNT [id number] DATE OF BIRTH:Jan 26, 1961 FACILITY: [facility] LOCATION: [facility] PHYSICIAN:GREGORY Randel Pigg, MD  OPERATIVE REPORT  DATE OF PROCEDURE:  12/11/2018  PREOPERATIVE DIAGNOSIS:  Left shoulder rotator cuff arthropathy and arthritis.  POSTOPERATIVE DIAGNOSIS:  Left shoulder rotator cuff arthropathy and arthritis.  PROCEDURE:  Left shoulder reverse shoulder replacement using [employer] components including glenoid mini baseplate with 1 compression screw and 4 locking screws and a 36 mm +3 offset glenosphere with size 10 x 83 mini humeral stem with 40 mm +3 taper offset  mini humeral tray and 36 standard polyethylene vitamin E crosslinked bearing.  SURGEON:  Meredith Pel, MD  ASSISTANT:  Laure Kidney, RNFA  INDICATIONS:  This is a 58 year old patient with end-stage shoulder arthritis and rotator cuff arthropathy who presents for operative management after explanation of risks and benefits.  PROCEDURE IN DETAIL:  The patient was brought to the operating room where general anesthetic was induced.  Preoperative antibiotics administered.  Timeout was called.  The patient was placed in the beach chair position with the head in neutral position.   Left shoulder, arm and axilla were prescrubbed with hydrogen peroxide, then alcohol and Betadine, which was allowed to air dry, then ChloraPrep solution.  Ioban used to cover the entire operative field.  Timeout was called.  The deltopectoral approach  was made.  Cephalic vein mobilized medially.  Biceps tendon was tenodesed to the upper border of the pec.  Subdeltoid adhesions were released manually.  Subscap was attached at its inferior half only.  The circumflex vessels were identified and ligated.   Subscap and capsule were then detached.  The coracohumeral ligament was released.  Deltoid was manually detached along its anterior portion about one-third of its attachment.  The progressive  external rotation was then performed after the axillary nerve  was identified and tagged with a suture loop.  This nerve was protected at all times during the rest of the case.  Head was dislocated.  Release of the capsule performed around to the 7 o'clock position.  The head was broached up to a size 10.  Head was  then cut using intramedullary alignment.  An appropriate cut was made.  At this time, a cap was placed and attention directed towards the humerus.  At this time, a Bankart lesion was created from the 12 o'clock to 7 o'clock position.  Labrum was excised  along with the biceps.  Next, a guide pin was placed with patient specific guides.  Reaming was performed.  About 3 mm inferiorly and 1.5 mm superiorly was reamed to a concentric circle to bleeding bone in the central portion of the bone.  Thorough  irrigation was performed and the baseplate was affixed using 1 compression central screw and 4 peripheral locking screws.  Good fit was obtained.  At this time, a +3 glenosphere was chosen and tapped into the [place] taper in good position with good  fixation achieved.  Attention was then redirected towards the humerus.  The head cut was made about 30 degrees of external rotation.  Similarly, broaching was performed up to a size 10.  This was with a mini humeral stem.  Good fixation was achieved and  reduction was trialled with a +3 offset humeral tray and 36 polyethylene.  This gave very stable reduction.  Excellent range of motion.  Good stability with extension and forward flexion and adduction.  At this time, trial components on the humeral side  were removed.  Thorough irrigation was performed.  True components were placed with same stability parameters maintained.  Shoulder was then irrigated and the subscapularis was closed through drill holes at the inferior portion of the humeral neck cut.   The subscapularis was reapproximated easily with the arm in about 45 degrees of external rotation.  The  rotator interval was closed with the arm externally rotated also using #1 Vicryl suture.  Vancomycin powder placed into the joint and then outside of  that closure.  Deltopectoral cleft closed using #1 Vicryl suture followed by interrupted inverted 0 Vicryl suture, 2-0 Vicryl suture and a 3-0 Monocryl.  Aquacel dressing placed.  Shoulder sling applied.  The patient tolerated the procedure well without  immediate complications.  He was transferred to the recovery room in stable condition.  TN/NUANCE  D:12/11/2018 T:12/11/2018 JOB:[id number]

## 2018-12-11 NOTE — Anesthesia Procedure Notes (Signed)
Procedure Name: Intubation Date/Time: 12/11/2018 7:33 AM Performed by: Valda Favia, CRNA Pre-anesthesia Checklist: Patient identified, Emergency Drugs available, Suction available and Patient being monitored Patient Re-evaluated:Patient Re-evaluated prior to induction Oxygen Delivery Method: Circle System Utilized Preoxygenation: Pre-oxygenation with 100% oxygen Induction Type: IV induction Ventilation: Mask ventilation without difficulty Laryngoscope Size: Mac and 4 Grade View: Grade I Tube type: Oral Tube size: 7.5 mm Number of attempts: 1 Airway Equipment and Method: Stylet and Oral airway Placement Confirmation: ETT inserted through vocal cords under direct vision,  positive ETCO2 and breath sounds checked- equal and bilateral Secured at: 23 cm Tube secured with: Tape Dental Injury: Teeth and Oropharynx as per pre-operative assessment

## 2018-12-12 ENCOUNTER — Encounter (HOSPITAL_COMMUNITY): Payer: Self-pay | Admitting: Orthopedic Surgery

## 2018-12-12 DIAGNOSIS — M12812 Other specific arthropathies, not elsewhere classified, left shoulder: Secondary | ICD-10-CM

## 2018-12-12 MED ORDER — ASPIRIN 81 MG PO TBEC: 81.0000 mg | DELAYED_RELEASE_TABLET | Freq: Two times a day (BID) | ORAL | 0 refills | Status: DC

## 2018-12-12 NOTE — Care Management Note (Addendum)
Case Management Note  Patient Details  Name: Stephen [name] MRN: [id number] Date of Birth: 1961/04/26  Subjective/Objective:  58 yr old gentleman s/p left reverse shoulder arthroplasty.                  Action/Plan:  Case manager spoke with patient concerning discharge plan. Patient is covered under [employer]. CM explained that they will have to arrange for his Home Health. CM has been trying to reach [place] to speak with patient's Education officer, museum. Unfortunately he doesn't know his Dr.'s name or his Education officer, museum. CM has left messages with SW dept. Will continue to work on arranging [place]. CM notified Dr. Marlou Sa of above.  2:11pm CM faxed orders for [facility] OT/PT, OP [place] and OT eval  to [employer] at [facility], [place], [place] . Fax [phone].    Expected Discharge Date:  12/12/18               Expected Discharge Plan:  [place]  In-House Referral:  NA  Discharge planning Services  CM Consult  Post Acute Care Choice:  Home Health, Durable Medical Equipment Choice offered to:  Patient  DME Arranged:  3-N-1 DME Agency:  [place]:  OT, PT [facility] Agency:  (to be arranged by [place])  Status of Service:  In process, will continue to follow  If discussed at Long Length of Stay Meetings, dates discussed:    Additional Comments:  Ninfa Meeker, RN 12/12/2018, 1:02 PM

## 2018-12-12 NOTE — Progress Notes (Signed)
Patient alert and oriented, Mae's well, voiding adequate amount of urine, swallowing without difficulty, c/o moderate pain and meds given prior to discharged for ride and discomfort. Patient discharged home with family. Script and discharged instructions given to family. Patient and family stated understanding of instructions given. Patient has an appointment with Dr.  Marlou Sa

## 2018-12-12 NOTE — Progress Notes (Signed)
Subjective: Pt stable - pain ok   Objective: Vital signs in last 24 hours: Temp:  [97 F (36.1 C)-98.5 F (36.9 C)] 98.4 F (36.9 C) (02/05 0331) Pulse Rate:  [66-104] 66 (02/05 0331) Resp:  [14-20] 16 (02/05 0331) BP: (130-159)/(73-103) 154/80 (02/05 0331) SpO2:  [97 %-100 %] 100 % (02/05 0331)  Intake/Output from previous day: 02/04 0701 - 02/05 0700 In: 1380 [P.O.:80; I.V.:1200] Out: 1400 [Urine:1250; Blood:150] Intake/Output this shift: No intake/output data recorded.  Exam:  Intact pulses distally No cellulitis present  Labs: No results for input(s): HGB in the last 72 hours. No results for input(s): WBC, RBC, HCT, PLT in the last 72 hours. No results for input(s): NA, K, CL, CO2, BUN, CREATININE, GLUCOSE, CALCIUM in the last 72 hours. No results for input(s): LABPT, INR in the last 72 hours.  Assessment/Plan: Plan dc today   G Scott Marlou Sa 12/12/2018, 7:09 AM

## 2018-12-12 NOTE — Evaluation (Signed)
Occupational Therapy Evaluation Patient Details Name: Stephen [name] MRN: [id number] DOB: [birth year] Today's Date: 12/12/2018    History of Present Illness Pt is a 58 year old patient with left shoulder pain, MRI reveals avascular necrosis with significant atrophy of rotator cuff muscles. S/p L reverse shoulder replacement. No significant PMH.    Clinical Impression   PTA patient independent and driving.  Admitted for above and limited by pain to L UE.  Patient educated on precautions, sling mgmt and wear schedule, exercises, ADL compensatory techniques, safety, DME and recommendations.  Verbal orders from Dr. Marlou Sa to complete shoulder exercises passively to degrees listed, then to progress to active; educated and provided handouts to patient.  Continues to require AAROM at elbow due to nerve block.  Able to complete sling mgmt with min assist, UB ADLs with min assist, LB ADLs with supervision, transfers with supervision. Verbalizes understanding of education and recommendations.  Will continue to follow while admitted, but anticipate no further needs after dc.      Follow Up Recommendations  No OT follow up;Follow surgeon's recommendation for DC plan and follow-up therapies    Equipment Recommendations  3 in 1 bedside commode    Recommendations for Other Services       Precautions / Restrictions Precautions Precautions: Shoulder Type of Shoulder Precautions: AROM elbow/wrist/hand, Shoulder PROM/AROM: FF to 90, AB 60, ER 30  Shoulder Interventions: Shoulder sling/immobilizer;At all times;Off for dressing/bathing/exercises Precaution Booklet Issued: Yes (comment) Precaution Comments: reviewed with patient  Required Braces or Orthoses: Sling Restrictions Weight Bearing Restrictions: Yes LUE Weight Bearing: Non weight bearing      Mobility Bed Mobility Overal bed mobility: Modified Independent             General bed mobility comments: no assist required, HOB elevated    Transfers Overall transfer level: Needs assistance Equipment used: None Transfers: Sit to/from Stand Sit to Stand: Supervision         General transfer comment: supervision for safety, no assist required    Balance Overall balance assessment: Mild deficits observed, not formally tested                                         ADL either performed or assessed with clinical judgement   ADL Overall ADL's : Needs assistance/impaired Eating/Feeding: Modified independent;Sitting   Grooming: Supervision/safety;Set up;Cueing for compensatory techniques;Standing   Upper Body Bathing: Minimal assistance;Sitting;Cueing for compensatory techniques;Cueing for UE precautions Upper Body Bathing Details (indicate cue type and reason): reviewed bathing positioning and safety, requires assist with R UE  Lower Body Bathing: Set up;Supervison/ safety;Sit to/from stand;Cueing for compensatory techniques   Upper Body Dressing : Minimal assistance;Sitting;Cueing for compensatory techniques;Cueing for UE precautions Upper Body Dressing Details (indicate cue type and reason): reviewed compensatory techniques for UB dressing, shoulder precautions and techniques  Lower Body Dressing: Supervision/safety;Set up;Sit to/from stand;Cueing for compensatory techniques Lower Body Dressing Details (indicate cue type and reason): reviewed use of slip on shoes and elastic waist band pants, dons/doffs socks with supervision  Toilet Transfer: Supervision/safety;Ambulation;BSC       Tub/ Shower Transfer: Tub transfer;Supervision/safety;Cueing for safety;Ambulation;3 in 1 Tub/Shower Transfer Details (indicate cue type and reason): reviewed safety and placement of 3:1 in tub as shower chair, simulated in room with R UE support stepping over threshold with supervision  Functional mobility during ADLs: Supervision/safety General ADL Comments: see below for shoulder education, good recall  and  understanding of compensatory techniques       Vision         Perception     Praxis      Pertinent Vitals/Pain Pain Assessment: Faces Faces Pain Scale: Hurts little more Pain Location: L UE with ROM  Pain Descriptors / Indicators: Discomfort;Grimacing;Operative site guarding Pain Intervention(s): Limited activity within patient's tolerance;Repositioned;Ice applied     Hand Dominance     Extremity/Trunk Assessment Upper Extremity Assessment Upper Extremity Assessment: LUE deficits/detail LUE Deficits / Details: s/p surgery with nerve block intact LUE Sensation: decreased light touch LUE Coordination: decreased gross motor;decreased fine motor   Lower Extremity Assessment Lower Extremity Assessment: Overall WFL for tasks assessed   Cervical / Trunk Assessment Cervical / Trunk Assessment: Normal   Communication Communication Communication: No difficulties   Cognition Arousal/Alertness: Awake/alert Behavior During Therapy: WFL for tasks assessed/performed Overall Cognitive Status: Within Functional Limits for tasks assessed                                     General Comments       Exercises Exercises: Shoulder;Other exercises Shoulder Exercises Shoulder Flexion: PROM;Self ROM;10 reps;Left;Supine(PROM, educated on SROM (limited to 10-15 degrees)) Shoulder ABduction: PROM;Self ROM;Left;10 reps;Supine(PROM, educated on SROM (limited to 10 degrees) ) Shoulder External Rotation: PROM;Self ROM;Left;10 reps;Supine(PROM, educated on SROM (limited to 10 degrees)) Elbow Flexion: AAROM;10 reps;Left;Seated Elbow Extension: AROM;Left;10 reps;Seated Wrist Flexion: AROM;Left;10 reps;Seated Wrist Extension: AROM;Left;10 reps;Seated Digit Composite Flexion: AROM;Left;10 reps;Seated Composite Extension: AROM;Left;10 reps;Seated Other Exercises Other Exercises: L UE AROM supniation/pronation x 10 reps  Other Exercises: educated on use of L UE as funcitonal assist  during ADLs    Shoulder Instructions Shoulder Instructions Donning/doffing shirt without moving shoulder: Minimal assistance Method for sponge bathing under operated UE: Minimal assistance Donning/doffing sling/immobilizer: Minimal assistance Correct positioning of sling/immobilizer: Minimal assistance ROM for elbow, wrist and digits of operated UE: Min-guard(due to nerve block to protect UE ) Sling wearing schedule (on at all times/off for ADL's): Supervision/safety Proper positioning of operated UE when showering: Supervision/safety Positioning of UE while sleeping: [place] expects to be discharged to:: Private residence Living Arrangements: Other relatives(nephew, siblings ) Available Help at Discharge: Family;Available 24 hours/day Type of Home: House Home Access: Stairs to enter [employer] of Steps: 5 Entrance Stairs-Rails: (+ rail ) Home Layout: One level     Bathroom Shower/Tub: Teacher, early years/pre: Standard     Home Equipment: None          Prior Functioning/Environment Level of Independence: Independent        Comments: reports limited by pain but independent ADLs, driving and IADLs        OT Problem List: Decreased strength;Decreased range of motion;Pain;Increased edema;Impaired UE functional use;Decreased coordination;Decreased safety awareness;Decreased knowledge of use of DME or AE;Decreased knowledge of precautions      OT Treatment/Interventions: Self-care/ADL training;Therapeutic exercise;DME and/or AE instruction;Balance training;Patient/family education;Therapeutic activities    OT Goals(Current goals can be found in the care plan section) Acute Rehab OT Goals Patient Stated Goal: home today, less pain  OT Goal Formulation: With patient Time For Goal Achievement: 12/26/18 Potential to Achieve Goals: Good  OT Frequency: Min 2X/week   Barriers to D/C:             Co-evaluation              AM-PAC  OT "6 Clicks" Daily Activity     Outcome Measure Help from another person eating meals?: None Help from another person taking care of personal grooming?: A Little Help from another person toileting, which includes using toliet, bedpan, or urinal?: None Help from another person bathing (including washing, rinsing, drying)?: A Little Help from another person to put on and taking off regular upper body clothing?: A Little Help from another person to put on and taking off regular lower body clothing?: None 6 Click Score: 21   End of Session Equipment Utilized During Treatment: Other (comment)(sling ) Nurse Communication: Mobility status  Activity Tolerance: Patient tolerated treatment well Patient left: in bed;with call bell/phone within reach  OT Visit Diagnosis: Pain Pain - Right/Left: Left Pain - part of body: Shoulder                Time: 1829-9371 OT Time Calculation (min): 28 min Charges:  OT General Charges $OT Visit: 1 Visit OT Evaluation $OT Eval Low Complexity: 1 Low OT Treatments $Self Care/Home Management : 8-22 mins  Delight Stare, OT Acute Rehabilitation Services Pager [phone] Office [phone]   KNIGHT OELKERS 12/12/2018, 8:54 AM

## 2018-12-14 ENCOUNTER — Telehealth (INDEPENDENT_AMBULATORY_CARE_PROVIDER_SITE_OTHER): Payer: Self-pay | Admitting: Orthopedic Surgery

## 2018-12-14 NOTE — Telephone Encounter (Signed)
I left voicemail for Verdis Frederickson advising.

## 2018-12-14 NOTE — Telephone Encounter (Signed)
y

## 2018-12-14 NOTE — Discharge Summary (Signed)
Physician Discharge Summary  Patient ID: STARK AGUINAGA MRN: [id number] DOB/AGE: [birth year] 60 y.o.  Admit date: 12/11/2018 Discharge date: 12/12/2018 Admission Diagnoses:  Active Problems:   Shoulder arthritis   Rotator cuff arthropathy of left shoulder   Discharge Diagnoses:  Same  Surgeries: Procedure(s): LEFT REVERSE SHOULDER ARTHROPLASTY on 12/11/2018   Consultants:   Discharged Condition: Stable  Hospital Course: Stephen [name] is an 58 y.o. male who was admitted 12/11/2018 with a chief complaint of left shoulder arthritis, and found to have a diagnosis of left shoulder arthritis.  They were brought to the operating room on 12/11/2018 and underwent the above named procedures.  He underwent reverse shoulder replacement at that time.  He tolerated the procedure well.  Block was still in effect at the time of discharge.  He is discharged home in good condition and will follow-up with me in about 10 days for evaluation.  CPM machine 1 hour 3 times a day to be used to facilitate shoulder motion.  Antibiotics given:  Anti-infectives (From admission, onward)   Start     Dose/Rate Route Frequency Ordered Stop   12/11/18 1400  ceFAZolin (ANCEF) IVPB 1 g/50 mL premix     1 g 100 mL/hr over 30 Minutes Intravenous Every 6 hours 12/11/18 1204 12/12/18 0339   12/11/18 0814  vancomycin (VANCOCIN) powder  Status:  Discontinued       As needed 12/11/18 0814 12/11/18 1047   12/11/18 0630  ceFAZolin (ANCEF) IVPB 2g/100 mL premix     2 g 200 mL/hr over 30 Minutes Intravenous On call to O.R. 12/11/18 1601 12/11/18 0747    .  Recent vital signs:  Vitals:   12/12/18 0726 12/12/18 1325  BP: (!) 158/95 (!) 140/91  Pulse: 83 83  Resp: 16 16  Temp: 98.6 F (37 C) 98.4 F (36.9 C)  SpO2: 100% 100%    Recent laboratory studies:  Results for orders placed or performed during the hospital encounter of 12/03/18  Urine culture  Result Value Ref Range   Specimen Description URINE, CLEAN CATCH     Special Requests NONE    Culture      NO GROWTH Performed at [facility], [place] [address], [place], [place] [address]    Report Status 12/04/2018 FINAL   Surgical pcr screen  Result Value Ref Range   MRSA, PCR NEGATIVE NEGATIVE   Staphylococcus aureus NEGATIVE NEGATIVE  CBC  Result Value Ref Range   WBC 6.8 4.0 - 10.5 K/uL   RBC 5.06 4.22 - 5.81 MIL/uL   Hemoglobin 14.5 13.0 - 17.0 g/dL   HCT 44.2 39.0 - 52.0 %   MCV 87.4 80.0 - 100.0 fL   MCH 28.7 26.0 - 34.0 pg   MCHC 32.8 30.0 - 36.0 g/dL   RDW 13.9 11.5 - 15.5 %   Platelets 393 150 - 400 K/uL   nRBC 0.0 0.0 - 0.2 %  Urinalysis, Routine w reflex microscopic  Result Value Ref Range   Color, Urine YELLOW YELLOW   APPearance CLEAR CLEAR   Specific Gravity, Urine 1.018 1.005 - 1.030   pH 5.0 5.0 - 8.0   Glucose, UA NEGATIVE NEGATIVE mg/dL   Hgb urine dipstick SMALL (A) NEGATIVE   Bilirubin Urine NEGATIVE NEGATIVE   Ketones, ur NEGATIVE NEGATIVE mg/dL   Protein, ur NEGATIVE NEGATIVE mg/dL   Nitrite NEGATIVE NEGATIVE   Leukocytes, UA NEGATIVE NEGATIVE   RBC / HPF 0-5 0 - 5 RBC/hpf   WBC,  UA 0-5 0 - 5 WBC/hpf   Bacteria, UA NONE SEEN NONE SEEN   Squamous Epithelial / LPF 0-5 0 - 5   Mucus PRESENT   Comprehensive metabolic panel  Result Value Ref Range   Sodium 138 135 - 145 mmol/L   Potassium 3.6 3.5 - 5.1 mmol/L   Chloride 105 98 - 111 mmol/L   CO2 23 22 - 32 mmol/L   Glucose, Bld 81 70 - 99 mg/dL   BUN 7 6 - 20 mg/dL   Creatinine, Ser 0.86 0.61 - 1.24 mg/dL   Calcium 9.2 8.9 - 10.3 mg/dL   Total Protein 7.9 6.5 - 8.1 g/dL   Albumin 4.0 3.5 - 5.0 g/dL   AST 39 15 - 41 U/L   ALT 37 0 - 44 U/L   Alkaline Phosphatase 120 38 - 126 U/L   Total Bilirubin 0.9 0.3 - 1.2 mg/dL   GFR calc non Af Amer >60 >60 mL/min   GFR calc Af Amer >60 >60 mL/min   Anion gap 10 5 - 15    Discharge Medications:   Allergies as of 12/12/2018   No Known Allergies     Medication List    STOP taking these medications    fexofenadine 180 MG tablet Commonly known as:  ALLEGRA ALLERGY   ondansetron 4 MG disintegrating tablet Commonly known as:  ZOFRAN ODT   sucralfate 1 GM/10ML suspension Commonly known as:  CARAFATE     TAKE these medications   aspirin 81 MG EC tablet Take 1 tablet (81 mg total) by mouth 2 (two) times daily.   oxymetazoline 0.05 % nasal spray Commonly known as:  AFRIN Place 1 spray into both nostrils 2 (two) times daily as needed for congestion.   traMADol 50 MG tablet Commonly known as:  ULTRAM Take 1-2 tablets (50-100 mg total) by mouth 3 (three) times daily as needed. What changed:    how much to take  when to take this       Diagnostic Studies: Dg Shoulder Left Port  Result Date: 12/11/2018 CLINICAL DATA:  Status post reverse shoulder arthroplasty today. EXAM: LEFT SHOULDER - 1 VIEW COMPARISON:  CT left shoulder 09/14/2018. FINDINGS: Reverse shoulder arthroplasty is in place. The device is located. No fracture. Acromioclavicular degenerative disease noted. IMPRESSION: Status post left shoulder replacement.  No acute abnormality. Electronically Signed   By: Inge Rise M.D.   On: 12/11/2018 11:21    Disposition:   Discharge Instructions    Call MD / Call 911   Complete by:  As directed    If you experience chest pain or shortness of breath, CALL 911 and be transported to the hospital emergency room.  If you develope a fever above 101 F, pus (white drainage) or increased drainage or redness at the wound, or calf pain, call your surgeon's office.   Constipation Prevention   Complete by:  As directed    Drink plenty of fluids.  Prune juice may be helpful.  You may use a stool softener, such as Colace (over the counter) 100 mg twice a day.  Use MiraLax (over the counter) for constipation as needed.   Diet - low sodium heart healthy   Complete by:  As directed    Discharge instructions   Complete by:  As directed    Sling - no lifting with left arm cpm machine 1  hour 3 times a day Ok to shower - dressing waterproof   Increase activity slowly as tolerated  Complete by:  As directed       Follow-up Information    Marlou Sa, Tonna Corner, MD Follow up.   Specialty:  Orthopedic Surgery Contact information: [place] [address] [phone]        [facility] Follow up.            Signed: Anderson Malta 12/14/2018, 9:32 AM

## 2018-12-14 NOTE — Telephone Encounter (Signed)
Ok for orders? 

## 2018-12-14 NOTE — Telephone Encounter (Signed)
Maria/[facility]/PT  1x week 1 2x week 2  HH PT [phone]  OK to leave a message of verbal ok on voicemail

## 2018-12-17 ENCOUNTER — Telehealth (INDEPENDENT_AMBULATORY_CARE_PROVIDER_SITE_OTHER): Payer: Self-pay | Admitting: Orthopedic Surgery

## 2018-12-17 NOTE — Telephone Encounter (Signed)
Called and lm on vm to advise ok per Dr. Marlou Sa to orders below. The only thing he recommends not doing is sub scap resistance for 6 weeks after surgery. To call with questions.

## 2018-12-17 NOTE — Telephone Encounter (Signed)
Nancy-(OT) with [facility] called left voicemail message stating OT eval done today. Patient is struggling with his pain. Talked about trying to keep ahead of the pain medicine and exercise frequently throughout the day. Izora Gala said requesting OT 3 times a week this week,twice a week next week and once the next. The number to contact Izora Gala is [phone]

## 2018-12-24 ENCOUNTER — Ambulatory Visit (INDEPENDENT_AMBULATORY_CARE_PROVIDER_SITE_OTHER): Payer: No Typology Code available for payment source | Admitting: Orthopedic Surgery

## 2018-12-24 ENCOUNTER — Ambulatory Visit (INDEPENDENT_AMBULATORY_CARE_PROVIDER_SITE_OTHER): Payer: No Typology Code available for payment source

## 2018-12-24 ENCOUNTER — Encounter (INDEPENDENT_AMBULATORY_CARE_PROVIDER_SITE_OTHER): Payer: Self-pay | Admitting: Orthopedic Surgery

## 2018-12-24 ENCOUNTER — Other Ambulatory Visit (INDEPENDENT_AMBULATORY_CARE_PROVIDER_SITE_OTHER): Payer: Self-pay

## 2018-12-24 DIAGNOSIS — M12812 Other specific arthropathies, not elsewhere classified, left shoulder: Secondary | ICD-10-CM

## 2018-12-24 MED ORDER — OXYCODONE HCL 5 MG PO TABS: 5.0000 mg | ORAL_TABLET | ORAL | 0 refills | Status: DC | PRN

## 2018-12-29 ENCOUNTER — Encounter (INDEPENDENT_AMBULATORY_CARE_PROVIDER_SITE_OTHER): Payer: Self-pay | Admitting: Orthopedic Surgery

## 2018-12-29 NOTE — Progress Notes (Signed)
   Post-Op Visit Note   Patient: Stephen [name]           Date of Birth: October 16, 1961           MRN: [id number] Visit Date: 12/24/2018 PCP: Stephen Nimrod, MD   Assessment & Plan:  Chief Complaint:  Chief Complaint  Patient presents with  . Left Shoulder - Routine Post Op   Visit Diagnoses:  1. Rotator cuff arthropathy of left shoulder     Plan: Stephen [name] is a patient is now about 2 weeks out left reverse shoulder.  On exam incisions intact deltoid is functional.  Passive range of motion is pretty reasonable.  I will refill his oxycodone and have him continue with the CPM machine.  Start him in some outpatient physical therapy.  Come back in 4 weeks for clinical recheck.  Follow-Up Instructions: Return in about 4 weeks (around 01/21/2019).   Orders:  Orders Placed This Encounter  Procedures  . XR Shoulder Left   No orders of the defined types were placed in this encounter.   Imaging: No results found.  PMFS History: Patient Active Problem List   Diagnosis Date Noted  . Rotator cuff arthropathy of left shoulder   . Shoulder arthritis 12/11/2018  . Hepatitis C antibody test positive 01/19/2018  . Chronic pain of both shoulders 01/17/2018  . Screening for colon cancer 01/17/2018  . Sinus congestion 01/17/2018  . Malnutrition of moderate degree 01/09/2018  . Acute alcoholic pancreatitis 40/98/1191   Past Medical History:  Diagnosis Date  . Pancreatitis     History reviewed. No pertinent family history.  Past Surgical History:  Procedure Laterality Date  . FRACTURE SURGERY    . REVERSE SHOULDER ARTHROPLASTY Left 12/11/2018   Procedure: LEFT REVERSE SHOULDER ARTHROPLASTY;  Surgeon: Meredith Pel, MD;  Location: [place];  Service: Orthopedics;  Laterality: Left;   Social History   Occupational History  . Not on file  Tobacco Use  . Smoking status: Current Every Day Smoker    Types: Cigarettes  . Smokeless tobacco: Never Used  . Tobacco comment: 2 cigs/day    Substance and Sexual Activity  . Alcohol use: Yes    Comment: 1-2 beers daily.   . Drug use: Yes    Types: Marijuana    Comment: smokes a few times a week  . Sexual activity: Not on file

## 2019-01-01 ENCOUNTER — Telehealth (INDEPENDENT_AMBULATORY_CARE_PROVIDER_SITE_OTHER): Payer: Self-pay | Admitting: Orthopedic Surgery

## 2019-01-01 DIAGNOSIS — M12812 Other specific arthropathies, not elsewhere classified, left shoulder: Secondary | ICD-10-CM

## 2019-01-01 NOTE — Telephone Encounter (Signed)
Put in order for outpatient therapy. I advised Izora Gala

## 2019-01-01 NOTE — Telephone Encounter (Signed)
Nancy-(OT) with [facility] called advised patient is being discharged today from [facility] and asked if patient can be set up for outpatient PT at [facility] outpatient [facility]? The number to contact Izora Gala is [phone]

## 2019-01-01 NOTE — Telephone Encounter (Signed)
Please advise if you want patient referred to outpatient PT and what restrictions/recommendations if so.

## 2019-01-01 NOTE — Telephone Encounter (Signed)
Ok for out pt pt rom and strengthening as tolerated 3 x week 4 weeks - 6 w thx

## 2019-01-23 ENCOUNTER — Encounter (INDEPENDENT_AMBULATORY_CARE_PROVIDER_SITE_OTHER): Payer: Self-pay | Admitting: Orthopedic Surgery

## 2019-01-23 ENCOUNTER — Ambulatory Visit (INDEPENDENT_AMBULATORY_CARE_PROVIDER_SITE_OTHER): Payer: No Typology Code available for payment source | Admitting: Orthopedic Surgery

## 2019-01-23 ENCOUNTER — Other Ambulatory Visit: Payer: Self-pay

## 2019-01-23 DIAGNOSIS — M12812 Other specific arthropathies, not elsewhere classified, left shoulder: Secondary | ICD-10-CM

## 2019-01-23 MED ORDER — HYDROCODONE-ACETAMINOPHEN 5-325 MG PO TABS: ORAL_TABLET | ORAL | 0 refills | Status: DC

## 2019-01-23 NOTE — Progress Notes (Signed)
   Post-Op Visit Note   Patient: Stephen [name]           Date of Birth: 11-28-1960           MRN: [id number] Visit Date: 01/23/2019 PCP: Stephen Nimrod, MD   Assessment & Plan:  Chief Complaint:  Chief Complaint  Patient presents with  . Follow-up   Visit Diagnoses: No diagnosis found.  Plan: [name] is a 58 year old patient is now 6 weeks out left reverse shoulder replacement.  Still having some pain.  Been in a CPM machine.  On exam his deltoid is functional but he does have passive range of motion to about 90 degrees of abduction and about 100 of forward flexion.  I would like to transition him off of oxycodone and start in physical therapy.  I will see him back in 6 weeks for clinical recheck.  Follow-Up Instructions: Return in about 6 weeks (around 03/06/2019).   Orders:  No orders of the defined types were placed in this encounter.  No orders of the defined types were placed in this encounter.   Imaging: No results found.  PMFS History: Patient Active Problem List   Diagnosis Date Noted  . Rotator cuff arthropathy of left shoulder   . Shoulder arthritis 12/11/2018  . Hepatitis C antibody test positive 01/19/2018  . Chronic pain of both shoulders 01/17/2018  . Screening for colon cancer 01/17/2018  . Sinus congestion 01/17/2018  . Malnutrition of moderate degree 01/09/2018  . Acute alcoholic pancreatitis 48/25/0037   Past Medical History:  Diagnosis Date  . Pancreatitis     History reviewed. No pertinent family history.  Past Surgical History:  Procedure Laterality Date  . FRACTURE SURGERY    . REVERSE SHOULDER ARTHROPLASTY Left 12/11/2018   Procedure: LEFT REVERSE SHOULDER ARTHROPLASTY;  Surgeon: Meredith Pel, MD;  Location: [place];  Service: Orthopedics;  Laterality: Left;   Social History   Occupational History  . Not on file  Tobacco Use  . Smoking status: Current Every Day Smoker    Types: Cigarettes  . Smokeless tobacco: Never Used  . Tobacco  comment: 2 cigs/day  Substance and Sexual Activity  . Alcohol use: Yes    Comment: 1-2 beers daily.   . Drug use: Yes    Types: Marijuana    Comment: smokes a few times a week  . Sexual activity: Not on file

## 2019-03-06 ENCOUNTER — Ambulatory Visit (INDEPENDENT_AMBULATORY_CARE_PROVIDER_SITE_OTHER): Payer: No Typology Code available for payment source | Admitting: Orthopedic Surgery

## 2019-03-07 ENCOUNTER — Ambulatory Visit (INDEPENDENT_AMBULATORY_CARE_PROVIDER_SITE_OTHER): Payer: No Typology Code available for payment source | Admitting: Orthopedic Surgery

## 2019-03-07 ENCOUNTER — Encounter (INDEPENDENT_AMBULATORY_CARE_PROVIDER_SITE_OTHER): Payer: Self-pay | Admitting: Orthopedic Surgery

## 2019-03-07 ENCOUNTER — Other Ambulatory Visit: Payer: Self-pay

## 2019-03-07 DIAGNOSIS — M12812 Other specific arthropathies, not elsewhere classified, left shoulder: Secondary | ICD-10-CM

## 2019-03-07 NOTE — Progress Notes (Signed)
   Post-Op Visit Note   Patient: Stephen [name]           Date of Birth: [birth year]           MRN: [id number] Visit Date: 03/07/2019 PCP: Lorella Nimrod, MD   Assessment & Plan:  Chief Complaint:  Chief Complaint  Patient presents with  . Left Shoulder - Follow-up   Visit Diagnoses:  1. Rotator cuff arthropathy of left shoulder     Plan: Stephen [name] is a patient is now almost 3 months out left shoulder reverse replacement.  He is been doing well.  He states his motion is improving.  He is pleased with his progress.  He would like to get the right shoulder worked on.  He is doing his own physical therapy.  On exam he does have forward flexion and abduction both about 90 degrees.  He has good strength.  Plan at this time is to go ahead and get him scheduled for right reverse shoulder replacement once the virus pandemic has passed.  I will see him back 10 days after that surgery.  Risk and benefits are discussed including but limited to infection nerve vessel damage dislocation and possible need for revision.  He states that he is now functional enough with the left arm for activities of daily living that he would like to get the right one done.  Follow-Up Instructions: No follow-ups on file.   Orders:  No orders of the defined types were placed in this encounter.  No orders of the defined types were placed in this encounter.   Imaging: No results found.  PMFS History: Patient Active Problem List   Diagnosis Date Noted  . Rotator cuff arthropathy of left shoulder   . Shoulder arthritis 12/11/2018  . Hepatitis C antibody test positive 01/19/2018  . Chronic pain of both shoulders 01/17/2018  . Screening for colon cancer 01/17/2018  . Sinus congestion 01/17/2018  . Malnutrition of moderate degree 01/09/2018  . Acute alcoholic pancreatitis 81/15/7262   Past Medical History:  Diagnosis Date  . Pancreatitis     History reviewed. No pertinent family history.  Past Surgical History:   Procedure Laterality Date  . FRACTURE SURGERY    . REVERSE SHOULDER ARTHROPLASTY Left 12/11/2018   Procedure: LEFT REVERSE SHOULDER ARTHROPLASTY;  Surgeon: Meredith Pel, MD;  Location: [place];  Service: Orthopedics;  Laterality: Left;   Social History   Occupational History  . Not on file  Tobacco Use  . Smoking status: Current Every Day Smoker    Types: Cigarettes  . Smokeless tobacco: Never Used  . Tobacco comment: 2 cigs/day  Substance and Sexual Activity  . Alcohol use: Yes    Comment: 1-2 beers daily.   . Drug use: Yes    Types: Marijuana    Comment: smokes a few times a week  . Sexual activity: Not on file

## 2019-05-06 ENCOUNTER — Encounter: Payer: Self-pay | Admitting: *Deleted

## 2019-05-07 NOTE — Pre-Procedure Instructions (Signed)
[id number] [name], [place] [address] Phone: [phone] Fax: [phone]      Your procedure is scheduled on Tuesday, July 7th.  Report to [facility] Main Entrance "A" at 5:30 A.M., and check in at the Admitting office.  Call this number if you have problems the morning of surgery:  [phone]  Call [phone] if you have any questions prior to your surgery date Monday-Friday 8am-4pm    Remember:  Do not eat or drink after midnight.    Take these medicines the morning of surgery with A SIP OF WATER  methocarbamol (ROBAXIN)-if needed for muscle spasms.   Follow your surgeon's instructions on when to stop Aspirin.  If no instructions were given by your surgeon then you will need to call the office to get those instructions.    As of today, STOP taking any Aspirin (unless otherwise instructed by your surgeon), Aleve, Naproxen, Ibuprofen, Motrin, Advil, Goody's, BC's, all herbal medications, fish oil, and all vitamins.    The Morning of Surgery  Do not wear jewelry.  Do not wear lotions, powders, colognes, or deodorant  Do not shave 48 hours prior to surgery.  Men may shave face and neck.  Do not bring valuables to the hospital.  [facility] is not responsible for any belongings or valuables.  If you are a smoker, DO NOT Smoke 24 hours prior to surgery IF you wear a CPAP at night please bring your mask, tubing, and machine the morning of surgery   Remember that you must have someone to transport you home after your surgery, and remain with you for 24 hours if you are discharged the same day.   Contacts, glasses, hearing aids, dentures or bridgework may not be worn into surgery.    Leave your suitcase in the car.  After surgery it may be brought to your room.  For patients admitted to the hospital, discharge time will be determined by your treatment team.  Patients discharged the day of surgery  will not be allowed to drive home.    Special instructions:   [place]- Preparing For Surgery  Before surgery, you can play an important role. Because skin is not sterile, your skin needs to be as free of germs as possible. You can reduce the number of germs on your skin by washing with CHG (chlorahexidine gluconate) Soap before surgery.  CHG is an antiseptic cleaner which kills germs and bonds with the skin to continue killing germs even after washing.    Oral Hygiene is also important to reduce your risk of infection.  Remember - BRUSH YOUR TEETH THE MORNING OF SURGERY WITH YOUR REGULAR TOOTHPASTE  Please do not use if you have an allergy to CHG or antibacterial soaps. If your skin becomes reddened/irritated stop using the CHG.  Do not shave (including legs and underarms) for at least 48 hours prior to first CHG shower. It is OK to shave your face.  Please follow these instructions carefully.   1. Shower the NIGHT BEFORE SURGERY and the MORNING OF SURGERY with CHG Soap.   2. If you chose to wash your hair, wash your hair first as usual with your normal shampoo.  3. After you shampoo, rinse your hair and body thoroughly to remove the shampoo.  4. Use CHG as you would any other liquid soap. You can apply CHG directly to the skin and wash gently with a scrungie or a clean  washcloth.   5. Apply the CHG Soap to your body ONLY FROM THE NECK DOWN.  Do not use on open wounds or open sores. Avoid contact with your eyes, ears, mouth and genitals (private parts). Wash Face and genitals (private parts)  with your normal soap.   6. Wash thoroughly, paying special attention to the area where your surgery will be performed.  7. Thoroughly rinse your body with warm water from the neck down.  8. DO NOT shower/wash with your normal soap after using and rinsing off the CHG Soap.  9. Pat yourself dry with a CLEAN TOWEL.  10. Wear CLEAN PAJAMAS to bed the night before surgery, wear comfortable  clothes the morning of surgery  11. Place CLEAN SHEETS on your bed the night of your first shower and DO NOT SLEEP WITH PETS.    Day of Surgery:  Do not apply any deodorants/lotions.  Please wear clean clothes to the hospital/surgery center.   Remember to brush your teeth WITH YOUR REGULAR TOOTHPASTE.   Please read over the following fact sheets that you were given.

## 2019-05-08 ENCOUNTER — Encounter (HOSPITAL_COMMUNITY): Payer: Self-pay

## 2019-05-08 ENCOUNTER — Encounter (HOSPITAL_COMMUNITY): Payer: Self-pay | Admitting: Physician Assistant

## 2019-05-08 ENCOUNTER — Encounter (HOSPITAL_COMMUNITY)
Admission: RE | Admit: 2019-05-08 | Discharge: 2019-05-08 | Disposition: A | Discharge status: Home / Self Care | Payer: No Typology Code available for payment source | Source: Ambulatory Visit | Attending: Orthopedic Surgery | Admitting: Orthopedic Surgery

## 2019-05-08 ENCOUNTER — Other Ambulatory Visit: Payer: Self-pay

## 2019-05-08 DIAGNOSIS — Z1159 Encounter for screening for other viral diseases: Secondary | ICD-10-CM | POA: Diagnosis not present

## 2019-05-08 DIAGNOSIS — Z01812 Encounter for preprocedural laboratory examination: Secondary | ICD-10-CM | POA: Insufficient documentation

## 2019-05-08 HISTORY — DX: Respiratory tuberculosis unspecified: A15.9

## 2019-05-08 HISTORY — DX: Unspecified osteoarthritis, unspecified site: M19.90

## 2019-05-08 HISTORY — DX: Gastro-esophageal reflux disease without esophagitis: K21.9

## 2019-05-08 LAB — COMPREHENSIVE METABOLIC PANEL
ALT: 28 U/L (ref 0–44)
AST: 42 U/L — ABNORMAL HIGH (ref 15–41)
Albumin: 4 g/dL (ref 3.5–5.0)
Alkaline Phosphatase: 121 U/L (ref 38–126)
Anion gap: 11 (ref 5–15)
BUN: 7 mg/dL (ref 6–20)
CO2: 27 mmol/L (ref 22–32)
Calcium: 9.5 mg/dL (ref 8.9–10.3)
Chloride: 100 mmol/L (ref 98–111)
Creatinine, Ser: 1.01 mg/dL (ref 0.61–1.24)
GFR calc Af Amer: >60 mL/min (ref >60)
GFR calc non Af Amer: >60 mL/min (ref >60)
Glucose, Bld: 138 mg/dL — ABNORMAL HIGH (ref 70–99)
Potassium: 3.7 mmol/L (ref 3.5–5.1)
Sodium: 138 mmol/L (ref 135–145)
Total Bilirubin: 0.7 mg/dL (ref 0.3–1.2)
Total Protein: 7.7 g/dL (ref 6.5–8.1)

## 2019-05-08 LAB — CBC
HCT: 42.8 % (ref 39.0–52.0)
Hemoglobin: 14.4 g/dL (ref 13.0–17.0)
MCH: 29 pg (ref 26.0–34.0)
MCHC: 33.6 g/dL (ref 30.0–36.0)
MCV: 86.1 fL (ref 80.0–100.0)
Platelets: 456 10*3/uL — ABNORMAL HIGH (ref 150–400)
RBC: 4.97 MIL/uL (ref 4.22–5.81)
RDW: 14.9 % (ref 11.5–15.5)
WBC: 9.9 10*3/uL (ref 4.0–10.5)
nRBC: 0.2 % (ref 0.0–0.2)

## 2019-05-08 LAB — URINALYSIS, ROUTINE W REFLEX MICROSCOPIC
Bacteria, UA: NONE SEEN
Bilirubin Urine: NEGATIVE
Glucose, UA: NEGATIVE mg/dL
Ketones, ur: NEGATIVE mg/dL
Nitrite: NEGATIVE
Protein, ur: 30 mg/dL — AB
Specific Gravity, Urine: 1.018 (ref 1.005–1.030)
pH: 6 (ref 5.0–8.0)

## 2019-05-08 LAB — SURGICAL PCR SCREEN
MRSA, PCR: NEGATIVE
Staphylococcus aureus: NEGATIVE

## 2019-05-08 NOTE — Progress Notes (Signed)
PCP - Mitzi [facility] Cardiologist - na  Chest x-ray - na EKG - 10/16/18 Stress Test - na ECHO - na Cardiac Cath - na  Sleep Study - na CPAP -   Fasting Blood Sugar - na Checks Blood Sugar _____ times a day  Blood Thinner Instructions: Aspirin Instructions:na  Anesthesia review:   Patient denies shortness of breath, fever, cough and chest pain at PAT appointment   Patient verbalized understanding of instructions that were given to them at the PAT appointment. Patient was also instructed that they will need to review over the PAT instructions again at home before surgery.

## 2019-05-08 NOTE — Progress Notes (Signed)
  Coronavirus Screening  Have you experienced the following symptoms:  Cough no Fever (>100.4F)  no Runny nose no Sore throat no Difficulty breathing/shortness of breath  no  Have you or a family member traveled in the last 14 days and where? no   If the patient indicates "YES" to the above questions, their PAT will be rescheduled to limit the exposure to others and, the surgeon will be notified. THE PATIENT WILL NEED TO BE ASYMPTOMATIC FOR 14 DAYS.   If the patient is not experiencing any of these symptoms, the PAT nurse will instruct them to NOT bring anyone with them to their appointment since they may have these symptoms or traveled as well.   Please remind your patients and families that hospital visitation restrictions are in effect and the importance of the restrictions.  

## 2019-05-08 NOTE — Pre-Procedure Instructions (Signed)
[id number] [address], [place] [address] Phone: [phone] Fax: [phone]      Your procedure is scheduled on Tuesday, July 7th.  Report to [facility] Main Entrance "A" at 5:30 A.M., and check in at the Admitting office.  Call this number if you have problems the morning of surgery:  [phone]  Call [phone] if you have any questions prior to your surgery date Monday-Friday 8am-4pm    Remember:  Do not eat  after midnight.  You may drink clear liquids until 4:30 A.M. Clear liquids allowed: water, juice (non-citric and without pulp), clear tea, black coffee only, gatorade, carbonated soda  Please complete your PRE-SURGERY ENSURE that was provided to you by ... the morning of surgery.  Please, if able, drink it in one setting. DO NOT SIP.    Take these medicines the morning of surgery with A SIP OF WATER  methocarbamol (ROBAXIN)-if needed for muscle spasms.   Follow your surgeon's instructions on when to stop Aspirin.  If no instructions were given by your surgeon then you will need to call the office to get those instructions.    As of today, STOP taking any Aspirin (unless otherwise instructed by your surgeon), Aleve, Naproxen, Ibuprofen, Motrin, Advil, Goody's, BC's, all herbal medications, fish oil, and all vitamins.    The Morning of Surgery  Do not wear jewelry.  Do not wear lotions, powders, colognes, or deodorant  Do not shave 48 hours prior to surgery.  Men may shave face and neck.  Do not bring valuables to the hospital.  [facility] is not responsible for any belongings or valuables.  If you are a smoker, DO NOT Smoke 24 hours prior to surgery IF you wear a CPAP at night please bring your mask, tubing, and machine the morning of surgery   Remember that you must have someone to transport you home after your surgery, and remain with you for 24 hours if you are discharged the same  day.   Contacts, glasses, hearing aids, dentures or bridgework may not be worn into surgery.    Leave your suitcase in the car.  After surgery it may be brought to your room.  For patients admitted to the hospital, discharge time will be determined by your treatment team.  Patients discharged the day of surgery will not be allowed to drive home.    Special instructions:   [place]- Preparing For Surgery  Before surgery, you can play an important role. Because skin is not sterile, your skin needs to be as free of germs as possible. You can reduce the number of germs on your skin by washing with CHG (chlorahexidine gluconate) Soap before surgery.  CHG is an antiseptic cleaner which kills germs and bonds with the skin to continue killing germs even after washing.    Oral Hygiene is also important to reduce your risk of infection.  Remember - BRUSH YOUR TEETH THE MORNING OF SURGERY WITH YOUR REGULAR TOOTHPASTE  Please do not use if you have an allergy to CHG or antibacterial soaps. If your skin becomes reddened/irritated stop using the CHG.  Do not shave (including legs and underarms) for at least 48 hours prior to first CHG shower. It is OK to shave your face.  Please follow these instructions carefully.   1. Shower the NIGHT BEFORE SURGERY and the MORNING OF SURGERY with CHG Soap.   2. If you chose to wash your  hair, wash your hair first as usual with your normal shampoo.  3. After you shampoo, rinse your hair and body thoroughly to remove the shampoo.  4. Use CHG as you would any other liquid soap. You can apply CHG directly to the skin and wash gently with a scrungie or a clean washcloth.   5. Apply the CHG Soap to your body ONLY FROM THE NECK DOWN.  Do not use on open wounds or open sores. Avoid contact with your eyes, ears, mouth and genitals (private parts). Wash Face and genitals (private parts)  with your normal soap.   6. Wash thoroughly, paying special attention to the  area where your surgery will be performed.  7. Thoroughly rinse your body with warm water from the neck down.  8. DO NOT shower/wash with your normal soap after using and rinsing off the CHG Soap.  9. Pat yourself dry with a CLEAN TOWEL.  10. Wear CLEAN PAJAMAS to bed the night before surgery, wear comfortable clothes the morning of surgery  11. Place CLEAN SHEETS on your bed the night of your first shower and DO NOT SLEEP WITH PETS.    Day of Surgery:  Do not apply any deodorants/lotions.  Please wear clean clothes to the hospital/surgery center.   Remember to brush your teeth WITH YOUR REGULAR TOOTHPASTE.   Please read over the following fact sheets that you were given.

## 2019-05-09 ENCOUNTER — Other Ambulatory Visit: Payer: Self-pay

## 2019-05-09 LAB — URINE CULTURE: Culture: NO GROWTH

## 2019-05-10 ENCOUNTER — Other Ambulatory Visit (HOSPITAL_COMMUNITY)
Admission: RE | Admit: 2019-05-10 | Discharge: 2019-05-10 | Disposition: A | Discharge status: Home / Self Care | Payer: No Typology Code available for payment source | Source: Ambulatory Visit | Attending: Orthopedic Surgery | Admitting: Orthopedic Surgery

## 2019-05-10 DIAGNOSIS — Z01812 Encounter for preprocedural laboratory examination: Secondary | ICD-10-CM | POA: Diagnosis not present

## 2019-05-10 LAB — SARS CORONAVIRUS 2 (TAT 6-24 HRS): SARS Coronavirus 2: NEGATIVE

## 2019-05-12 ENCOUNTER — Emergency Department (HOSPITAL_COMMUNITY): Payer: Medicare HMO

## 2019-05-12 ENCOUNTER — Inpatient Hospital Stay (HOSPITAL_COMMUNITY)
Admission: EM | Admit: 2019-05-12 | Discharge: 2019-05-15 | DRG: 439 | Disposition: A | Discharge status: Home / Self Care | Payer: Medicare HMO | Attending: Internal Medicine | Admitting: Internal Medicine

## 2019-05-12 ENCOUNTER — Other Ambulatory Visit: Payer: Self-pay

## 2019-05-12 ENCOUNTER — Encounter (HOSPITAL_COMMUNITY): Payer: Self-pay | Admitting: Emergency Medicine

## 2019-05-12 DIAGNOSIS — Z8611 Personal history of tuberculosis: Secondary | ICD-10-CM | POA: Diagnosis not present

## 2019-05-12 DIAGNOSIS — K852 Alcohol induced acute pancreatitis without necrosis or infection: Principal | ICD-10-CM | POA: Diagnosis present

## 2019-05-12 DIAGNOSIS — R768 Other specified abnormal immunological findings in serum: Secondary | ICD-10-CM | POA: Diagnosis present

## 2019-05-12 DIAGNOSIS — K860 Alcohol-induced chronic pancreatitis: Secondary | ICD-10-CM | POA: Diagnosis present

## 2019-05-12 DIAGNOSIS — E871 Hypo-osmolality and hyponatremia: Secondary | ICD-10-CM | POA: Diagnosis not present

## 2019-05-12 DIAGNOSIS — K219 Gastro-esophageal reflux disease without esophagitis: Secondary | ICD-10-CM | POA: Diagnosis present

## 2019-05-12 DIAGNOSIS — K861 Other chronic pancreatitis: Secondary | ICD-10-CM | POA: Diagnosis present

## 2019-05-12 DIAGNOSIS — Z1159 Encounter for screening for other viral diseases: Secondary | ICD-10-CM

## 2019-05-12 DIAGNOSIS — M19011 Primary osteoarthritis, right shoulder: Secondary | ICD-10-CM | POA: Diagnosis present

## 2019-05-12 DIAGNOSIS — B1920 Unspecified viral hepatitis C without hepatic coma: Secondary | ICD-10-CM | POA: Diagnosis present

## 2019-05-12 DIAGNOSIS — E876 Hypokalemia: Secondary | ICD-10-CM | POA: Diagnosis present

## 2019-05-12 DIAGNOSIS — R112 Nausea with vomiting, unspecified: Secondary | ICD-10-CM | POA: Diagnosis not present

## 2019-05-12 DIAGNOSIS — F1020 Alcohol dependence, uncomplicated: Secondary | ICD-10-CM | POA: Diagnosis present

## 2019-05-12 DIAGNOSIS — R109 Unspecified abdominal pain: Secondary | ICD-10-CM

## 2019-05-12 LAB — URINALYSIS, ROUTINE W REFLEX MICROSCOPIC
Bacteria, UA: NONE SEEN
Glucose, UA: NEGATIVE mg/dL
Ketones, ur: 20 mg/dL — AB
Leukocytes,Ua: NEGATIVE
Nitrite: NEGATIVE
Protein, ur: 100 mg/dL — AB
Specific Gravity, Urine: 1.026 (ref 1.005–1.030)
pH: 6 (ref 5.0–8.0)

## 2019-05-12 LAB — COMPREHENSIVE METABOLIC PANEL
ALT: 25 U/L (ref 0–44)
AST: 31 U/L (ref 15–41)
Albumin: 4.5 g/dL (ref 3.5–5.0)
Alkaline Phosphatase: 95 U/L (ref 38–126)
Anion gap: 13 (ref 5–15)
BUN: 8 mg/dL (ref 6–20)
CO2: 28 mmol/L (ref 22–32)
Calcium: 9.8 mg/dL (ref 8.9–10.3)
Chloride: 93 mmol/L — ABNORMAL LOW (ref 98–111)
Creatinine, Ser: 0.89 mg/dL (ref 0.61–1.24)
GFR calc Af Amer: >60 mL/min (ref >60)
GFR calc non Af Amer: >60 mL/min (ref >60)
Glucose, Bld: 155 mg/dL — ABNORMAL HIGH (ref 70–99)
Potassium: 3.3 mmol/L — ABNORMAL LOW (ref 3.5–5.1)
Sodium: 134 mmol/L — ABNORMAL LOW (ref 135–145)
Total Bilirubin: 1.1 mg/dL (ref 0.3–1.2)
Total Protein: 8.6 g/dL — ABNORMAL HIGH (ref 6.5–8.1)

## 2019-05-12 LAB — CBC
HCT: 47.2 % (ref 39.0–52.0)
Hemoglobin: 16.4 g/dL (ref 13.0–17.0)
MCH: 29.9 pg (ref 26.0–34.0)
MCHC: 34.7 g/dL (ref 30.0–36.0)
MCV: 86.1 fL (ref 80.0–100.0)
Platelets: 346 10*3/uL (ref 150–400)
RBC: 5.48 MIL/uL (ref 4.22–5.81)
RDW: 14.8 % (ref 11.5–15.5)
WBC: 11.1 10*3/uL — ABNORMAL HIGH (ref 4.0–10.5)
nRBC: 0 % (ref 0.0–0.2)

## 2019-05-12 LAB — LIPASE, BLOOD: Lipase: 302 U/L — ABNORMAL HIGH (ref 11–51)

## 2019-05-12 LAB — MAGNESIUM: Magnesium: 2.2 mg/dL (ref 1.7–2.4)

## 2019-05-12 MED ORDER — THIAMINE HCL 100 MG/ML IJ SOLN
Freq: Once | INTRAVENOUS | Status: AC
  Administered 2019-05-12: 10:00:00 via INTRAVENOUS
  Filled 2019-05-12: qty 1000

## 2019-05-12 MED ORDER — HYDROMORPHONE HCL 1 MG/ML IJ SOLN
1.0000 mg | INTRAMUSCULAR | Status: DC | PRN
  Administered 2019-05-12: 1 mg via INTRAVENOUS
  Filled 2019-05-12: qty 1

## 2019-05-12 MED ORDER — ONDANSETRON HCL 4 MG/2ML IJ SOLN: 4.0000 mg | Freq: Once | INTRAMUSCULAR | Status: DC | PRN

## 2019-05-12 MED ORDER — FENTANYL CITRATE (PF) 100 MCG/2ML IJ SOLN
100.0000 ug | Freq: Once | INTRAMUSCULAR | Status: AC
  Administered 2019-05-12: 100 ug via INTRAVENOUS
  Filled 2019-05-12: qty 2

## 2019-05-12 MED ORDER — ONDANSETRON HCL 4 MG/2ML IJ SOLN: 4.0000 mg | Freq: Four times a day (QID) | INTRAMUSCULAR | Status: DC | PRN

## 2019-05-12 MED ORDER — SODIUM CHLORIDE 0.9% FLUSH: 3.0000 mL | Freq: Once | INTRAVENOUS | Status: DC

## 2019-05-12 MED ORDER — POTASSIUM CHLORIDE CRYS ER 20 MEQ PO TBCR
40.0000 meq | EXTENDED_RELEASE_TABLET | Freq: Once | ORAL | Status: AC
  Administered 2019-05-12: 40 meq via ORAL
  Filled 2019-05-12: qty 2

## 2019-05-12 MED ORDER — ENOXAPARIN SODIUM 40 MG/0.4ML ~~LOC~~ SOLN
40.0000 mg | SUBCUTANEOUS | Status: DC
  Administered 2019-05-12 – 2019-05-14 (×3): 40 mg via SUBCUTANEOUS
  Filled 2019-05-12 (×3): qty 0.4

## 2019-05-12 MED ORDER — KETOROLAC TROMETHAMINE 30 MG/ML IJ SOLN
15.0000 mg | Freq: Once | INTRAMUSCULAR | Status: AC
  Administered 2019-05-12: 15 mg via INTRAVENOUS
  Filled 2019-05-12: qty 1

## 2019-05-12 MED ORDER — BOOST / RESOURCE BREEZE PO LIQD CUSTOM
1.0000 | Freq: Three times a day (TID) | ORAL | Status: DC
  Administered 2019-05-12 – 2019-05-15 (×6): 1 via ORAL

## 2019-05-12 MED ORDER — LIDOCAINE VISCOUS HCL 2 % MT SOLN
15.0000 mL | Freq: Once | OROMUCOSAL | Status: AC
  Administered 2019-05-12: 15 mL via ORAL
  Filled 2019-05-12: qty 15

## 2019-05-12 MED ORDER — ACETAMINOPHEN 325 MG PO TABS: 650.0000 mg | ORAL_TABLET | Freq: Four times a day (QID) | ORAL | Status: DC | PRN

## 2019-05-12 MED ORDER — ONDANSETRON HCL 4 MG PO TABS: 4.0000 mg | ORAL_TABLET | Freq: Four times a day (QID) | ORAL | Status: DC | PRN

## 2019-05-12 MED ORDER — PANTOPRAZOLE SODIUM 40 MG PO TBEC
40.0000 mg | DELAYED_RELEASE_TABLET | Freq: Every day | ORAL | Status: DC
  Administered 2019-05-12 – 2019-05-13 (×2): 40 mg via ORAL
  Filled 2019-05-12 (×2): qty 1

## 2019-05-12 MED ORDER — HYDROMORPHONE HCL 1 MG/ML IJ SOLN
1.0000 mg | INTRAMUSCULAR | Status: DC | PRN
  Administered 2019-05-12 – 2019-05-13 (×6): 1 mg via INTRAVENOUS
  Filled 2019-05-12 (×6): qty 1

## 2019-05-12 MED ORDER — ALUM & MAG HYDROXIDE-SIMETH 200-200-20 MG/5ML PO SUSP
30.0000 mL | Freq: Once | ORAL | Status: AC
  Administered 2019-05-12: 30 mL via ORAL
  Filled 2019-05-12: qty 30

## 2019-05-12 NOTE — ED Notes (Signed)
Bed: WA17 Expected date:  Expected time:  Means of arrival:  Comments: EMS 

## 2019-05-12 NOTE — ED Notes (Signed)
ED TO INPATIENT HANDOFF REPORT  ED Nurse Name and Phone #: [phone]  S Name/Age/Gender Stephen [name] 58 y.o. male Room/Bed: WA17/WA17  Code Status   Code Status: Prior  Home/SNF/Other Home Patient oriented to: self, place, time and situation Is this baseline? Yes   Triage Complete: Triage complete  Chief Complaint abd pain/right shoulder pain   Triage Note Patient coming from home with complaints of generalized abdominal pain that started yesterday. Patient denies N/V/D. Patient denies drinking alcohol today. Patient crying out in pain upon assessment. Patient states that he has a history of pancreatis.     Allergies No Known Allergies  Level of Care/Admitting Diagnosis ED Disposition    ED Disposition Condition Comment   Admit  Hospital Area: [facility] [[id number]]  Level of Care: Med-Surg [16]  Covid Evaluation: N/A  Diagnosis: Acute alcoholic pancreatitis [384665]  Admitting Physician: Elwyn Reach [[id number]]  Attending Physician: Elwyn Reach [[id number]]  Estimated length of stay: past midnight tomorrow  Certification:: I certify this patient will need inpatient services for at least 2 midnights  PT Class (Do Not Modify): Inpatient [101]  PT Acc Code (Do Not Modify): Private [1]       B Medical/Surgery History Past Medical History:  Diagnosis Date  . Arthritis   . GERD (gastroesophageal reflux disease)   . Pancreatitis   . Tuberculosis    yrs. ago pt. states he was dx. here at [facility]   Past Surgical History:  Procedure Laterality Date  . FRACTURE SURGERY     jaw  . REVERSE SHOULDER ARTHROPLASTY Left 12/11/2018   Procedure: LEFT REVERSE SHOULDER ARTHROPLASTY;  Surgeon: Meredith Pel, MD;  Location: [place];  Service: Orthopedics;  Laterality: Left;     A IV Location/Drains/Wounds Patient Lines/Drains/Airways Status   Active Line/Drains/Airways    Name:   Placement date:   Placement time:   Site:   Days:   Peripheral IV  05/12/19 Right Antecubital   05/12/19    0412    Antecubital   less than 1   Incision (Closed) 12/11/18 Shoulder Left   12/11/18    0948     152          Intake/Output Last 24 hours No intake or output data in the 24 hours ending 05/12/19 9935  Labs/Imaging Results for orders placed or performed during the hospital encounter of 05/12/19 (from the past 48 hour(s))  Lipase, blood     Status: Abnormal   Collection Time: 05/12/19  4:15 AM  Result Value Ref Range   Lipase 302 (H) 11 - 51 U/L    Comment: Performed at [facility], [place] [address], [place],  [address]  Comprehensive metabolic panel     Status: Abnormal   Collection Time: 05/12/19  4:15 AM  Result Value Ref Range   Sodium 134 (L) 135 - 145 mmol/L   Potassium 3.3 (L) 3.5 - 5.1 mmol/L   Chloride 93 (L) 98 - 111 mmol/L   CO2 28 22 - 32 mmol/L   Glucose, Bld 155 (H) 70 - 99 mg/dL   BUN 8 6 - 20 mg/dL   Creatinine, Ser 0.89 0.61 - 1.24 mg/dL   Calcium 9.8 8.9 - 10.3 mg/dL   Total Protein 8.6 (H) 6.5 - 8.1 g/dL   Albumin 4.5 3.5 - 5.0 g/dL   AST 31 15 - 41 U/L   ALT 25 0 - 44 U/L   Alkaline Phosphatase 95 38 -  126 U/L   Total Bilirubin 1.1 0.3 - 1.2 mg/dL   GFR calc non Af Amer >60 >60 mL/min   GFR calc Af Amer >60 >60 mL/min   Anion gap 13 5 - 15    Comment: Performed at [facility], [place] [address], [place], [place] [address]  CBC     Status: Abnormal   Collection Time: 05/12/19  4:15 AM  Result Value Ref Range   WBC 11.1 (H) 4.0 - 10.5 K/uL   RBC 5.48 4.22 - 5.81 MIL/uL   Hemoglobin 16.4 13.0 - 17.0 g/dL   HCT 47.2 39.0 - 52.0 %   MCV 86.1 80.0 - 100.0 fL   MCH 29.9 26.0 - 34.0 pg   MCHC 34.7 30.0 - 36.0 g/dL   RDW 14.8 11.5 - 15.5 %   Platelets 346 150 - 400 K/uL   nRBC 0.0 0.0 - 0.2 %    Comment: Performed at [facility], [place] [address] [place], [place] [address]  Urinalysis, Routine w reflex microscopic     Status: Abnormal   Collection  Time: 05/12/19  4:15 AM  Result Value Ref Range   Color, Urine YELLOW YELLOW   APPearance HAZY (A) CLEAR   Specific Gravity, Urine 1.026 1.005 - 1.030   pH 6.0 5.0 - 8.0   Glucose, UA NEGATIVE NEGATIVE mg/dL   Hgb urine dipstick SMALL (A) NEGATIVE   Bilirubin Urine MODERATE (A) NEGATIVE   Ketones, ur 20 (A) NEGATIVE mg/dL   Protein, ur 100 (A) NEGATIVE mg/dL   Nitrite NEGATIVE NEGATIVE   Leukocytes,Ua NEGATIVE NEGATIVE   RBC / HPF 6-10 0 - 5 RBC/hpf   WBC, UA 0-5 0 - 5 WBC/hpf   Bacteria, UA NONE SEEN NONE SEEN   Squamous Epithelial / LPF 0-5 0 - 5   Mucus PRESENT    Hyaline Casts, UA PRESENT     Comment: Performed at [facility], [place] [address] [place], [place] [address]  Magnesium     Status: None   Collection Time: 05/12/19  4:15 AM  Result Value Ref Range   Magnesium 2.2 1.7 - 2.4 mg/dL    Comment: Performed at [facility], [place] [address] [place], [place] [address]   Dg Abd Acute 2+v W 1v Chest  Result Date: 05/12/2019 CLINICAL DATA:  Abdominal pain.  History of pancreatitis. EXAM: DG ABDOMEN ACUTE W/ 1V CHEST COMPARISON:  CT chest, abdomen, and pelvis dated October 16, 2018. Chest x-ray dated October 23, 2004. FINDINGS: The cardiomediastinal silhouette is normal in size. Normal pulmonary vascularity. Right upper lobe scarring again noted. No focal consolidation, pleural effusion, or pneumothorax. Interval left reverse total shoulder arthroplasty. There is no evidence of dilated bowel loops or free intraperitoneal air. No radiopaque calculi or other significant radiographic abnormality is seen. Chronic bilateral femoral head avascular necrosis again noted. IMPRESSION: 1. No acute abnormality.  No active cardiopulmonary disease. Electronically Signed   By: Titus Dubin M.D.   On: 05/12/2019 05:43    Pending Labs Unresulted Labs (From admission, onward)    Start     Ordered   05/12/19 0551  Novel Coronavirus,NAA,(SEND-OUT TO REF LAB -  TAT 24-48 hrs); Hosp Order  (Asymptomatic Patients Labs)  Once,   STAT    Question:  Rule Out  Answer:  Yes   05/12/19 0550   Signed and Held  HIV antibody (Routine Testing)  Once,   R     Signed and Held  Signed and Held  CBC  (enoxaparin (LOVENOX)    CrCl >/= 30 ml/min)  Once,   R    Comments: Baseline for enoxaparin therapy IF NOT ALREADY DRAWN.  Notify MD if PLT < 100 K.    Signed and Held   Signed and Held  Creatinine, serum  (enoxaparin (LOVENOX)    CrCl >/= 30 ml/min)  Once,   R    Comments: Baseline for enoxaparin therapy IF NOT ALREADY DRAWN.    Signed and Held   Signed and Held  Creatinine, serum  (enoxaparin (LOVENOX)    CrCl >/= 30 ml/min)  Weekly,   R    Comments: while on enoxaparin therapy    Signed and Held   Signed and Held  Comprehensive metabolic panel  Tomorrow morning,   R     Signed and Held   Signed and Held  CBC  Tomorrow morning,   R     Signed and Held          Vitals/Pain Today's Vitals   05/12/19 0530 05/12/19 0600 05/12/19 0628 05/12/19 0701  BP: (!) 153/99 (!) 150/98    Pulse: 84 89    Resp: 18 20    Temp:   97.7 F (36.5 C)   SpO2: 100% 100%    Weight:      Height:      PainSc:  Asleep  8     Isolation Precautions No active isolations  Medications Medications  sodium chloride flush (NS) 0.9 % injection 3 mL (has no administration in time range)  ondansetron (ZOFRAN) injection 4 mg (has no administration in time range)  potassium chloride SA (K-DUR) CR tablet 40 mEq (has no administration in time range)  alum & mag hydroxide-simeth (MAALOX/MYLANTA) 200-200-20 MG/5ML suspension 30 mL (30 mLs Oral Given 05/12/19 0410)    And  lidocaine (XYLOCAINE) 2 % viscous mouth solution 15 mL (15 mLs Oral Given 05/12/19 0410)  ketorolac (TORADOL) 30 MG/ML injection 15 mg (15 mg Intravenous Given 05/12/19 0412)  fentaNYL (SUBLIMAZE) injection 100 mcg (100 mcg Intravenous Given 05/12/19 0620)    Mobility walks Low fall risk   Focused  Assessments    R Recommendations: See Admitting Provider Note  Report given to:   Additional Notes:

## 2019-05-12 NOTE — ED Provider Notes (Signed)
No symptoms of covid.  Swab sent   Consepcion Utt, MD 05/12/19 9861

## 2019-05-12 NOTE — H&P (Addendum)
History and Physical   Stephen [name] WGN:[id number] DOB: [birth year] DOA: 05/12/2019  Referring MD/NP/PA: Dr. Nicholes Stairs  PCP: Earlene Plater, MD   Outpatient Specialists: None  Patient coming from: Home  Chief Complaint: Abdominal pain nausea vomiting  HPI: Stephen [name] is a 58 y.o. male with medical history significant of Chronic pancreatitis secondary to alcoholism, GERD, osteoarthritis, remote history of tuberculosis who presented to the ER with abdominal pain rated as 9 out of 10 associated with nausea but no vomiting.  Patient also has significant alcohol intake.  Symptoms are consistent with his previous pancreatitis.  Denied any fever or chills no melena no bright red blood per rectum.  Patient denied any other significant complaints.  Has more epigastric pain radiating to the back.  Patient's last drink was last night.  Patient evaluated and found to have lipase of more than 300 and is being admitted with acute on chronic pancreatitis secondary to alcohol.Patient has right shoulder surgery on Tuesday by Dr dean at [facility]..  ED Course: Temperature 98.6 blood pressure 153/99 pulse 102 respiratory 20 oxygen sats 100% room air.  White count is 11.1 hemoglobin 16.4 and platelet 346.  Sodium 134 potassium 3.3 chloride 93 CO2 28 BUN 8 creatinine 0.89 calcium 9.8 glucose 155.  Lipase is 302.  White count 11.1.  Acute abdominal series showed no acute findings.  Patient being admitted with pancreatitis.  Review of Systems: As per HPI otherwise 10 point review of systems negative.    Past Medical History:  Diagnosis Date  . Arthritis   . GERD (gastroesophageal reflux disease)   . Pancreatitis   . Tuberculosis    yrs. ago pt. states he was dx. here at [facility]    Past Surgical History:  Procedure Laterality Date  . FRACTURE SURGERY     jaw  . REVERSE SHOULDER ARTHROPLASTY Left 12/11/2018   Procedure: LEFT REVERSE SHOULDER ARTHROPLASTY;  Surgeon: Meredith Pel, MD;  Location: [place];   Service: Orthopedics;  Laterality: Left;     reports that he has been smoking cigarettes. He has never used smokeless tobacco. He reports current alcohol use. He reports current drug use. Drug: Marijuana.  No Known Allergies  No family history on file.   Prior to Admission medications   Medication Sig Start Date End Date Taking? Authorizing Provider  aspirin EC 81 MG EC tablet Take 1 tablet (81 mg total) by mouth 2 (two) times daily. Patient not taking: Reported on 05/12/2019 12/12/18   Meredith Pel, MD  HYDROcodone-acetaminophen (NORCO/VICODIN) 5-325 MG tablet 1 po q 8-12hrs prn pain Patient not taking: Reported on 05/01/2019 01/23/19   Meredith Pel, MD  oxyCODONE (OXY IR/ROXICODONE) 5 MG immediate release tablet Take 1 tablet (5 mg total) by mouth every 4 (four) hours as needed for severe pain. Patient not taking: Reported on 05/01/2019 12/24/18   Meredith Pel, MD  traMADol (ULTRAM) 50 MG tablet Take 1-2 tablets (50-100 mg total) by mouth 3 (three) times daily as needed. Patient not taking: Reported on 05/01/2019 01/30/18   Leandrew Koyanagi, MD    Physical Exam: Vitals:   05/12/19 0347 05/12/19 0349 05/12/19 0425 05/12/19 0530  BP: (!) 153/98   (!) 153/99  Pulse: (!) 102  86 84  Resp: 20  20 18   Temp: 98.6 F (37 C)     SpO2: 100%  100% 100%  Weight:  56.7 kg    Height:  5\' 7"  (1.702 m)  Constitutional: NAD, calm, mildly stuporous and malnourished Vitals:   05/12/19 0347 05/12/19 0349 05/12/19 0425 05/12/19 0530  BP: (!) 153/98   (!) 153/99  Pulse: (!) 102  86 84  Resp: 20  20 18   Temp: 98.6 F (37 C)     SpO2: 100%  100% 100%  Weight:  56.7 kg    Height:  5\' 7"  (1.702 m)     Eyes: PERRL, lids and conjunctivae normal ENMT: Mucous membranes are moist. Posterior pharynx clear of any exudate or lesions.Normal dentition.  Neck: normal, supple, no masses, no thyromegaly Respiratory: clear to auscultation bilaterally, no wheezing, no crackles. Normal  respiratory effort. No accessory muscle use.  Cardiovascular: Regular rate and rhythm, no murmurs / rubs / gallops. No extremity edema. 2+ pedal pulses. No carotid bruits.  Abdomen: Epigastric tenderness, no masses palpated. No hepatosplenomegaly. Bowel sounds positive.  Musculoskeletal: no clubbing / cyanosis. No joint deformity upper and lower extremities. Good ROM, no contractures. Normal muscle tone.  Skin: no rashes, lesions, ulcers. No induration Neurologic: CN 2-12 grossly intact. Sensation intact, DTR normal. Strength 5/5 in all 4.  Psychiatric: Normal judgment and insight. Alert and oriented x 3. Normal mood.     Labs on Admission: I have personally reviewed following labs and imaging studies  CBC: Recent Labs  Lab 05/08/19 1136 05/12/19 0415  WBC 9.9 11.1*  HGB 14.4 16.4  HCT 42.8 47.2  MCV 86.1 86.1  PLT 456* 585   Basic Metabolic Panel: Recent Labs  Lab 05/08/19 1145 05/12/19 0415  NA 138 134*  K 3.7 3.3*  CL 100 93*  CO2 27 28  GLUCOSE 138* 155*  BUN 7 8  CREATININE 1.01 0.89  CALCIUM 9.5 9.8   GFR: Estimated Creatinine Clearance: 71.7 mL/min (by C-G formula based on SCr of 0.89 mg/dL). Liver Function Tests: Recent Labs  Lab 05/08/19 1145 05/12/19 0415  AST 42* 31  ALT 28 25  ALKPHOS 121 95  BILITOT 0.7 1.1  PROT 7.7 8.6*  ALBUMIN 4.0 4.5   Recent Labs  Lab 05/12/19 0415  LIPASE 302*   No results for input(s): AMMONIA in the last 168 hours. Coagulation Profile: No results for input(s): INR, PROTIME in the last 168 hours. Cardiac Enzymes: No results for input(s): CKTOTAL, CKMB, CKMBINDEX, TROPONINI in the last 168 hours. BNP (last 3 results) No results for input(s): PROBNP in the last 8760 hours. HbA1C: No results for input(s): HGBA1C in the last 72 hours. CBG: No results for input(s): GLUCAP in the last 168 hours. Lipid Profile: No results for input(s): CHOL, HDL, LDLCALC, TRIG, CHOLHDL, LDLDIRECT in the last 72 hours. Thyroid Function  Tests: No results for input(s): TSH, T4TOTAL, FREET4, T3FREE, THYROIDAB in the last 72 hours. Anemia Panel: No results for input(s): VITAMINB12, FOLATE, FERRITIN, TIBC, IRON, RETICCTPCT in the last 72 hours. Urine analysis:    Component Value Date/Time   COLORURINE YELLOW 05/12/2019 0415   APPEARANCEUR HAZY (A) 05/12/2019 0415   LABSPEC 1.026 05/12/2019 0415   PHURINE 6.0 05/12/2019 0415   GLUCOSEU NEGATIVE 05/12/2019 0415   HGBUR SMALL (A) 05/12/2019 0415   BILIRUBINUR MODERATE (A) 05/12/2019 0415   KETONESUR 20 (A) 05/12/2019 0415   PROTEINUR 100 (A) 05/12/2019 0415   UROBILINOGEN 1.0 04/14/2015 1728   NITRITE NEGATIVE 05/12/2019 0415   LEUKOCYTESUR NEGATIVE 05/12/2019 0415   Sepsis Labs: @LABRCNTIP (procalcitonin:4,lacticidven:4) ) Recent Results (from the past 240 hour(s))  Urine culture     Status: None   Collection Time: 05/08/19 11:39  AM   Specimen: Urine, Clean Catch  Result Value Ref Range Status   Specimen Description URINE, CLEAN CATCH  Final   Special Requests NONE  Final   Culture   Final    NO GROWTH Performed at [facility], [address] [place], [place] [address]    Report Status 05/09/2019 FINAL  Final  Surgical pcr screen     Status: None   Collection Time: 05/08/19 11:40 AM   Specimen: Nasal Mucosa; Nasal Swab  Result Value Ref Range Status   MRSA, PCR NEGATIVE NEGATIVE Final   Staphylococcus aureus NEGATIVE NEGATIVE Final    Comment: (NOTE) The Xpert SA Assay (FDA approved for NASAL specimens in patients 23 years of age and older), is one component of a comprehensive surveillance program. It is not intended to diagnose infection nor to guide or monitor treatment. Performed at [facility], [place] [address] [place], [place] [address]   SARS Coronavirus 2 (Performed in [facility] hospital lab)     Status: None   Collection Time: 05/10/19  9:31 AM   Specimen: Nasal Swab  Result Value Ref Range Status   SARS Coronavirus 2 NEGATIVE  NEGATIVE Final    Comment: (NOTE) SARS-CoV-2 target nucleic acids are NOT DETECTED. The SARS-CoV-2 RNA is generally detectable in upper and lower respiratory specimens during the acute phase of infection. Negative results do not preclude SARS-CoV-2 infection, do not rule out co-infections with other pathogens, and should not be used as the sole basis for treatment or other patient management decisions. Negative results must be combined with clinical observations, patient history, and epidemiological information. The expected result is Negative. Fact Sheet for Patients: SugarRoll.be Fact Sheet for Healthcare Providers: https://www.woods-mathews.com/ This test is not yet approved or cleared by the [place] FDA and  has been authorized for detection and/or diagnosis of SARS-CoV-2 by FDA under an Emergency Use Authorization (EUA). This EUA will remain  in effect (meaning this test can be used) for the duration of the COVID-19 declaration under Section 56 4(b)(1) of the Act, 21 U.S.C. section 360bbb-3(b)(1), unless the authorization is terminated or revoked sooner. Performed at [facility], [place] [address], [place], [place] [address]      Radiological Exams on Admission: Dg Abd Acute 2+v W 1v Chest  Result Date: 05/12/2019 CLINICAL DATA:  Abdominal pain.  History of pancreatitis. EXAM: DG ABDOMEN ACUTE W/ 1V CHEST COMPARISON:  CT chest, abdomen, and pelvis dated October 16, 2018. Chest x-ray dated October 23, 2004. FINDINGS: The cardiomediastinal silhouette is normal in size. Normal pulmonary vascularity. Right upper lobe scarring again noted. No focal consolidation, pleural effusion, or pneumothorax. Interval left reverse total shoulder arthroplasty. There is no evidence of dilated bowel loops or free intraperitoneal air. No radiopaque calculi or other significant radiographic abnormality is seen. Chronic bilateral femoral head avascular  necrosis again noted. IMPRESSION: 1. No acute abnormality.  No active cardiopulmonary disease. Electronically Signed   By: Titus Dubin M.D.   On: 05/12/2019 05:43     Assessment/Plan Principal Problem:   Acute alcoholic pancreatitis Active Problems:   Hyponatremia   Hepatitis C antibody test positive   Hypokalemia     #1 acute on chronic alcoholic pancreatitis: Patient will be admitted for pain management and supportive care.  Pain management and IV fluids.  Repeat lipase in the morning.  We will keep n.p.o. until pain subsides.  #2 alcoholism: Counseling provided.  CIWA protocol will be activated once patient shows  signs of withdrawal.  #3 hepatitis C positive: Defer care to outpatient setting.  Counseling provided regarding alcohol and hepatitis C.  #4 hyponatremia: Monitor sodium level.  Saline infusion.  #5 hypokalemia: Replete potassium.  Most likely from nausea vomiting.  #^ Right shoulder OA:  Planned surgery on Tuesday if stable.   DVT prophylaxis: Lovenox Code Status: Full code Family Communication: No family at bedside Disposition Plan: Home Consults called: None Admission status: Inpatient  Severity of Illness: The appropriate patient status for this patient is INPATIENT. Inpatient status is judged to be reasonable and necessary in order to provide the required intensity of service to ensure the patient's safety. The patient's presenting symptoms, physical exam findings, and initial radiographic and laboratory data in the context of their chronic comorbidities is felt to place them at high risk for further clinical deterioration. Furthermore, it is not anticipated that the patient will be medically stable for discharge from the hospital within 2 midnights of admission. The following factors support the patient status of inpatient.   " The patient's presenting symptoms include abdominal pain with nausea. " The worrisome physical exam findings include epigastric  tenderness. " The initial radiographic and laboratory data are worrisome because of lipase of more than 300. " The chronic co-morbidities include chronic pancreatitis.   * I certify that at the point of admission it is my clinical judgment that the patient will require inpatient hospital care spanning beyond 2 midnights from the point of admission due to high intensity of service, high risk for further deterioration and high frequency of surveillance required.Barbette Merino MD [facility] Pager [phone]  If 7PM-7AM, please contact night-coverage [email or web address] Password [email or web address]  05/12/2019, 6:16 AM

## 2019-05-12 NOTE — Progress Notes (Signed)
PROGRESS NOTE   Stephen HEINLE  JYN:[id number]    DOB: [birth year]    DOA: 05/12/2019  PCP: Earlene Plater, MD   I have briefly reviewed patients previous medical records in [facility].  Chief Complaint  Patient presents with   Abdominal Pain    Brief Narrative:  58 year old male with PMH of chronic pancreatitis, alcohol dependence, GERD, remote history of TB, presented to the ED with complaints of abdominal pain and nausea without vomiting consistent with prior episodes of pancreatitis and lipase was >300.  Admitted for suspected acute on chronic pancreatitis due to ongoing alcohol use.   Assessment & Plan:   Principal Problem:   Acute alcoholic pancreatitis Active Problems:   Hyponatremia   Hepatitis C antibody test positive   Hypokalemia   Acute on chronic alcoholic pancreatitis  Likely related to ongoing alcohol use.  Lipase 302 on admission.  No acute abdomen on exam.  Start clear liquids and advance diet as tolerated.  Continue gentle IV fluids for now.  Alcohol abstinence counseled.  Alcohol dependence  Reports that he only drinks 2 beers per day and the last drink was 2 to 3 days PTA.  Denies drinking hard liquor.  No overt withdrawal.  CIWA protocol.  GERD  Added PPI.  His abdominal pain could also be related to gastritis/esophagitis or PUD from alcohol.  Hypokalemia  Replace and follow.  Magnesium 2.2.  Hepatitis C positive  Outpatient follow-up with ID to be arranged by PCP.  Right shoulder osteoarthritis  Scheduled for surgery on 7/7.   DVT prophylaxis: Lovenox Code Status: Full Family Communication: None at bedside Disposition: DC home pending clinical improvement, hopefully in the next 1 to 2 days.   Consultants:  None  Procedures:  None  Antimicrobials:  None   Subjective: Ongoing epigastric region mild to moderate pain.  However wants to drink something.  No nausea or vomiting reported.  Having regular BMs.  No  chest pain. Reports that he is supposed to have right shoulder surgery with Dr. Marlou Sa on 7/7.  ROS: Above, otherwise negative.  Objective:  Vitals:   05/12/19 0628 05/12/19 0700 05/12/19 0730 05/12/19 0928  BP:  (!) 146/84 (!) 138/96 (!) 144/109  Pulse:  76 82 85  Resp:  16 14 16   Temp: 97.7 F (36.5 C)   98.2 F (36.8 C)  TempSrc:    Oral  SpO2:  100% 100% 100%  Weight:      Height:        Examination:  General exam: Pleasant young male, moderately built and nourished lying comfortably supine in bed without distress.  Oral mucosa moist. Respiratory system: Clear to auscultation. Respiratory effort normal. Cardiovascular system: S1 & S2 heard, RRR. No JVD, murmurs, rubs, gallops or clicks. No pedal edema. Gastrointestinal system: Abdomen is nondistended, soft.  Mild epigastric region tenderness without rigidity, guarding or rebound. No organomegaly or masses felt. Normal bowel sounds heard. Central nervous system: Alert and oriented. No focal neurological deficits. Extremities: Symmetric 5 x 5 power. Skin: No rashes, lesions or ulcers Psychiatry: Judgement and insight appear normal. Mood & affect appropriate.     Data Reviewed: I have personally reviewed following labs and imaging studies  CBC: Recent Labs  Lab 05/08/19 1136 05/12/19 0415  WBC 9.9 11.1*  HGB 14.4 16.4  HCT 42.8 47.2  MCV 86.1 86.1  PLT 456* 865   Basic Metabolic Panel: Recent Labs  Lab 05/08/19 1145 05/12/19 0415  NA 138 134*  K 3.7  3.3*  CL 100 93*  CO2 27 28  GLUCOSE 138* 155*  BUN 7 8  CREATININE 1.01 0.89  CALCIUM 9.5 9.8  MG  --  2.2   Liver Function Tests: Recent Labs  Lab 05/08/19 1145 05/12/19 0415  AST 42* 31  ALT 28 25  ALKPHOS 121 95  BILITOT 0.7 1.1  PROT 7.7 8.6*  ALBUMIN 4.0 4.5     Recent Results (from the past 240 hour(s))  Urine culture     Status: None   Collection Time: 05/08/19 11:39 AM   Specimen: Urine, Clean Catch  Result Value Ref Range Status    Specimen Description URINE, CLEAN CATCH  Final   Special Requests NONE  Final   Culture   Final    NO GROWTH Performed at [facility], [place] [address], [place], [place] [address]    Report Status 05/09/2019 FINAL  Final  Surgical pcr screen     Status: None   Collection Time: 05/08/19 11:40 AM   Specimen: Nasal Mucosa; Nasal Swab  Result Value Ref Range Status   MRSA, PCR NEGATIVE NEGATIVE Final   Staphylococcus aureus NEGATIVE NEGATIVE Final    Comment: (NOTE) The Xpert SA Assay (FDA approved for NASAL specimens in patients 9 years of age and older), is one component of a comprehensive surveillance program. It is not intended to diagnose infection nor to guide or monitor treatment. Performed at [facility], [place] [address], [place], [place] [address]   SARS Coronavirus 2 (Performed in [facility] hospital lab)     Status: None   Collection Time: 05/10/19  9:31 AM   Specimen: Nasal Swab  Result Value Ref Range Status   SARS Coronavirus 2 NEGATIVE NEGATIVE Final    Comment: (NOTE) SARS-CoV-2 target nucleic acids are NOT DETECTED. The SARS-CoV-2 RNA is generally detectable in upper and lower respiratory specimens during the acute phase of infection. Negative results do not preclude SARS-CoV-2 infection, do not rule out co-infections with other pathogens, and should not be used as the sole basis for treatment or other patient management decisions. Negative results must be combined with clinical observations, patient history, and epidemiological information. The expected result is Negative. Fact Sheet for Patients: SugarRoll.be Fact Sheet for Healthcare Providers: https://www.woods-mathews.com/ This test is not yet approved or cleared by the [place] FDA and  has been authorized for detection and/or diagnosis of SARS-CoV-2 by FDA under an Emergency Use Authorization (EUA). This EUA will remain  in effect (meaning this  test can be used) for the duration of the COVID-19 declaration under Section 56 4(b)(1) of the Act, 21 U.S.C. section 360bbb-3(b)(1), unless the authorization is terminated or revoked sooner. Performed at [facility], [place] [address], [place],  [address]          Radiology Studies: Dg Abd Acute 2+v W 1v Chest  Result Date: 05/12/2019 CLINICAL DATA:  Abdominal pain.  History of pancreatitis. EXAM: DG ABDOMEN ACUTE W/ 1V CHEST COMPARISON:  CT chest, abdomen, and pelvis dated October 16, 2018. Chest x-ray dated October 23, 2004. FINDINGS: The cardiomediastinal silhouette is normal in size. Normal pulmonary vascularity. Right upper lobe scarring again noted. No focal consolidation, pleural effusion, or pneumothorax. Interval left reverse total shoulder arthroplasty. There is no evidence of dilated bowel loops or free intraperitoneal air. No radiopaque calculi or other significant radiographic abnormality is seen. Chronic bilateral femoral head avascular necrosis again noted. IMPRESSION: 1. No acute abnormality.  No active cardiopulmonary  disease. Electronically Signed   By: Titus Dubin M.D.   On: 05/12/2019 05:43        Scheduled Meds:  enoxaparin (LOVENOX) injection  40 mg Subcutaneous Q24H   feeding supplement  1 Container Oral TID BM   sodium chloride flush  3 mL Intravenous Once   Continuous Infusions:   LOS: 0 days     Vernell Leep, MD, FACP, [facility]. [facility]  To contact the attending provider between 7A-7P or the covering provider during after hours 7P-7A, please log into the web site [email or web address] and access using universal [place] password for that web site. If you do not have the password, please call the hospital operator.  05/12/2019, 1:39 PM

## 2019-05-12 NOTE — ED Notes (Signed)
Pt transported to XRay 

## 2019-05-12 NOTE — ED Triage Notes (Addendum)
Patient coming from home with complaints of generalized abdominal pain that started yesterday. Patient denies N/V/D. Patient denies drinking alcohol today. Patient crying out in pain upon assessment. Patient states that he has a history of pancreatis.

## 2019-05-12 NOTE — ED Provider Notes (Addendum)
[facility] [facility] DEPT Provider Note   CSN: [id number] Arrival date & time: 05/12/19  0334     History   Chief Complaint Chief Complaint  Patient presents with  . Abdominal Pain    HPI ANTOINNE [name] is a 58 y.o. male.     The history is provided by the patient.  Abdominal Pain Pain location:  Epigastric Pain quality: burning   Pain radiates to:  Does not radiate Pain severity:  Severe Onset quality:  Gradual Duration:  3 days Timing:  Constant Progression:  Worsening Chronicity:  Recurrent Context: alcohol use   Context: not eating   Relieved by:  Nothing Worsened by:  Nothing Ineffective treatments:  None tried Associated symptoms: no anorexia, no chest pain, no chills, no constipation, no cough, no diarrhea, no dysuria, no hematuria, no nausea and no vomiting   Risk factors: alcohol abuse     Past Medical History:  Diagnosis Date  . Arthritis   . GERD (gastroesophageal reflux disease)   . Pancreatitis   . Tuberculosis    yrs. ago pt. states he was dx. here at [facility]    Patient Active Problem List   Diagnosis Date Noted  . Rotator cuff arthropathy of left shoulder   . Shoulder arthritis 12/11/2018  . Hepatitis C antibody test positive 01/19/2018  . Chronic pain of both shoulders 01/17/2018  . Screening for colon cancer 01/17/2018  . Sinus congestion 01/17/2018  . Malnutrition of moderate degree 01/09/2018  . Acute alcoholic pancreatitis 51/88/4166    Past Surgical History:  Procedure Laterality Date  . FRACTURE SURGERY     jaw  . REVERSE SHOULDER ARTHROPLASTY Left 12/11/2018   Procedure: LEFT REVERSE SHOULDER ARTHROPLASTY;  Surgeon: Meredith Pel, MD;  Location: [facility];  Service: Orthopedics;  Laterality: Left;        Home Medications    Prior to Admission medications   Medication Sig Start Date End Date Taking? Authorizing Provider  aspirin EC 81 MG EC tablet Take 1 tablet (81 mg total) by mouth 2 (two) times  daily. Patient not taking: Reported on 05/12/2019 12/12/18   Meredith Pel, MD  HYDROcodone-acetaminophen (NORCO/VICODIN) 5-325 MG tablet 1 po q 8-12hrs prn pain Patient not taking: Reported on 05/01/2019 01/23/19   Meredith Pel, MD  oxyCODONE (OXY IR/ROXICODONE) 5 MG immediate release tablet Take 1 tablet (5 mg total) by mouth every 4 (four) hours as needed for severe pain. Patient not taking: Reported on 05/01/2019 12/24/18   Meredith Pel, MD  traMADol (ULTRAM) 50 MG tablet Take 1-2 tablets (50-100 mg total) by mouth 3 (three) times daily as needed. Patient not taking: Reported on 05/01/2019 01/30/18   Leandrew Koyanagi, MD    Family History No family history on file.  Social History Social History   Tobacco Use  . Smoking status: Current Every Day Smoker    Types: Cigarettes  . Smokeless tobacco: Never Used  . Tobacco comment: 1  Substance Use Topics  . Alcohol use: Yes    Comment: 1-2 beers daily.   . Drug use: Yes    Types: Marijuana    Comment: smokes a few times a week     Allergies   Patient has no known allergies.   Review of Systems Review of Systems  Constitutional: Negative for chills and diaphoresis.  Respiratory: Negative for cough and choking.   Cardiovascular: Negative for chest pain, palpitations and leg swelling.  Gastrointestinal: Positive for abdominal pain. Negative for  anorexia, constipation, diarrhea, nausea and vomiting.  Genitourinary: Negative for dysuria, flank pain and hematuria.  All other systems reviewed and are negative.    Physical Exam Updated Vital Signs BP (!) 153/98   Pulse 86   Temp 98.6 F (37 C)   Resp 20   Ht 5\' 7"  (1.702 m)   Wt 56.7 kg   SpO2 100%   BMI 19.58 kg/m   Physical Exam Vitals signs and nursing note reviewed.  Constitutional:      General: He is not in acute distress.    Appearance: He is normal weight.  HENT:     Head: Normocephalic and atraumatic.     Nose: Nose normal.  Eyes:      Conjunctiva/sclera: Conjunctivae normal.     Pupils: Pupils are equal, round, and reactive to light.  Neck:     Musculoskeletal: Normal range of motion and neck supple.  Cardiovascular:     Rate and Rhythm: Normal rate and regular rhythm.     Pulses: Normal pulses.     Heart sounds: Normal heart sounds.  Pulmonary:     Effort: Pulmonary effort is normal.     Breath sounds: Normal breath sounds.  Abdominal:     General: Abdomen is flat. Bowel sounds are normal.     Tenderness: There is no abdominal tenderness. There is no guarding or rebound.  Musculoskeletal: Normal range of motion.  Skin:    General: Skin is warm and dry.     Capillary Refill: Capillary refill takes less than 2 seconds.  Neurological:     General: No focal deficit present.     Mental Status: He is alert and oriented to person, place, and time.  Psychiatric:        Mood and Affect: Mood normal.        Behavior: Behavior normal.      ED Treatments / Results  Labs (all labs ordered are listed, but only abnormal results are displayed) Results for orders placed or performed during the hospital encounter of 05/12/19  Lipase, blood  Result Value Ref Range   Lipase 302 (H) 11 - 51 U/L  Comprehensive metabolic panel  Result Value Ref Range   Sodium 134 (L) 135 - 145 mmol/L   Potassium 3.3 (L) 3.5 - 5.1 mmol/L   Chloride 93 (L) 98 - 111 mmol/L   CO2 28 22 - 32 mmol/L   Glucose, Bld 155 (H) 70 - 99 mg/dL   BUN 8 6 - 20 mg/dL   Creatinine, Ser 0.89 0.61 - 1.24 mg/dL   Calcium 9.8 8.9 - 10.3 mg/dL   Total Protein 8.6 (H) 6.5 - 8.1 g/dL   Albumin 4.5 3.5 - 5.0 g/dL   AST 31 15 - 41 U/L   ALT 25 0 - 44 U/L   Alkaline Phosphatase 95 38 - 126 U/L   Total Bilirubin 1.1 0.3 - 1.2 mg/dL   GFR calc non Af Amer >60 >60 mL/min   GFR calc Af Amer >60 >60 mL/min   Anion gap 13 5 - 15  CBC  Result Value Ref Range   WBC 11.1 (H) 4.0 - 10.5 K/uL   RBC 5.48 4.22 - 5.81 MIL/uL   Hemoglobin 16.4 13.0 - 17.0 g/dL   HCT  47.2 39.0 - 52.0 %   MCV 86.1 80.0 - 100.0 fL   MCH 29.9 26.0 - 34.0 pg   MCHC 34.7 30.0 - 36.0 g/dL   RDW 14.8 11.5 - 15.5 %  Platelets 346 150 - 400 K/uL   nRBC 0.0 0.0 - 0.2 %  Urinalysis, Routine w reflex microscopic  Result Value Ref Range   Color, Urine YELLOW YELLOW   APPearance HAZY (A) CLEAR   Specific Gravity, Urine 1.026 1.005 - 1.030   pH 6.0 5.0 - 8.0   Glucose, UA NEGATIVE NEGATIVE mg/dL   Hgb urine dipstick SMALL (A) NEGATIVE   Bilirubin Urine MODERATE (A) NEGATIVE   Ketones, ur 20 (A) NEGATIVE mg/dL   Protein, ur 100 (A) NEGATIVE mg/dL   Nitrite NEGATIVE NEGATIVE   Leukocytes,Ua NEGATIVE NEGATIVE   RBC / HPF 6-10 0 - 5 RBC/hpf   WBC, UA 0-5 0 - 5 WBC/hpf   Bacteria, UA NONE SEEN NONE SEEN   Squamous Epithelial / LPF 0-5 0 - 5   Mucus PRESENT    Hyaline Casts, UA PRESENT    Dg Abd Acute 2+v W 1v Chest  Result Date: 05/12/2019 CLINICAL DATA:  Abdominal pain.  History of pancreatitis. EXAM: DG ABDOMEN ACUTE W/ 1V CHEST COMPARISON:  CT chest, abdomen, and pelvis dated October 16, 2018. Chest x-ray dated October 23, 2004. FINDINGS: The cardiomediastinal silhouette is normal in size. Normal pulmonary vascularity. Right upper lobe scarring again noted. No focal consolidation, pleural effusion, or pneumothorax. Interval left reverse total shoulder arthroplasty. There is no evidence of dilated bowel loops or free intraperitoneal air. No radiopaque calculi or other significant radiographic abnormality is seen. Chronic bilateral femoral head avascular necrosis again noted. IMPRESSION: 1. No acute abnormality.  No active cardiopulmonary disease. Electronically Signed   By: Titus Dubin M.D.   On: 05/12/2019 05:43    Radiology No results found.  Procedures Procedures (including critical care time)  Medications Ordered in ED Medications  sodium chloride flush (NS) 0.9 % injection 3 mL (has no administration in time range)  ondansetron (ZOFRAN) injection 4 mg (has no  administration in time range)  fentaNYL (SUBLIMAZE) injection 100 mcg (has no administration in time range)  alum & mag hydroxide-simeth (MAALOX/MYLANTA) 200-200-20 MG/5ML suspension 30 mL (30 mLs Oral Given 05/12/19 0410)    And  lidocaine (XYLOCAINE) 2 % viscous mouth solution 15 mL (15 mLs Oral Given 05/12/19 0410)  ketorolac (TORADOL) 30 MG/ML injection 15 mg (15 mg Intravenous Given 05/12/19 0412)      Final Clinical Impressions(s) / ED Diagnoses   Acute pancreatitis, secondary to alcohol abuse.      Dierre Crevier, MD 05/12/19 [phone]

## 2019-05-13 ENCOUNTER — Inpatient Hospital Stay (HOSPITAL_COMMUNITY): Payer: Medicare HMO

## 2019-05-13 DIAGNOSIS — R112 Nausea with vomiting, unspecified: Secondary | ICD-10-CM

## 2019-05-13 LAB — COMPREHENSIVE METABOLIC PANEL
ALT: 18 U/L (ref 0–44)
AST: 23 U/L (ref 15–41)
Albumin: 4 g/dL (ref 3.5–5.0)
Alkaline Phosphatase: 78 U/L (ref 38–126)
Anion gap: 9 (ref 5–15)
BUN: 6 mg/dL (ref 6–20)
CO2: 29 mmol/L (ref 22–32)
Calcium: 9 mg/dL (ref 8.9–10.3)
Chloride: 99 mmol/L (ref 98–111)
Creatinine, Ser: 0.82 mg/dL (ref 0.61–1.24)
GFR calc Af Amer: >60 mL/min (ref >60)
GFR calc non Af Amer: >60 mL/min (ref >60)
Glucose, Bld: 87 mg/dL (ref 70–99)
Potassium: 3.3 mmol/L — ABNORMAL LOW (ref 3.5–5.1)
Sodium: 137 mmol/L (ref 135–145)
Total Bilirubin: 0.7 mg/dL (ref 0.3–1.2)
Total Protein: 7.6 g/dL (ref 6.5–8.1)

## 2019-05-13 LAB — CBC
HCT: 44.3 % (ref 39.0–52.0)
Hemoglobin: 14.6 g/dL (ref 13.0–17.0)
MCH: 29.3 pg (ref 26.0–34.0)
MCHC: 33 g/dL (ref 30.0–36.0)
MCV: 89 fL (ref 80.0–100.0)
Platelets: 280 10*3/uL (ref 150–400)
RBC: 4.98 MIL/uL (ref 4.22–5.81)
RDW: 14.9 % (ref 11.5–15.5)
WBC: 11.7 10*3/uL — ABNORMAL HIGH (ref 4.0–10.5)
nRBC: 0 % (ref 0.0–0.2)

## 2019-05-13 LAB — HIV ANTIBODY (ROUTINE TESTING W REFLEX): HIV Screen 4th Generation wRfx: NONREACTIVE

## 2019-05-13 MED ORDER — PANTOPRAZOLE SODIUM 40 MG IV SOLR
40.0000 mg | INTRAVENOUS | Status: DC
  Administered 2019-05-13 – 2019-05-14 (×2): 40 mg via INTRAVENOUS
  Filled 2019-05-13 (×2): qty 40

## 2019-05-13 MED ORDER — ONDANSETRON HCL 4 MG/2ML IJ SOLN: 4.0000 mg | Freq: Four times a day (QID) | INTRAMUSCULAR | Status: DC | PRN

## 2019-05-13 MED ORDER — ACETAMINOPHEN 325 MG PO TABS: 650.0000 mg | ORAL_TABLET | Freq: Four times a day (QID) | ORAL | Status: DC | PRN

## 2019-05-13 MED ORDER — ONDANSETRON HCL 4 MG PO TABS: 4.0000 mg | ORAL_TABLET | Freq: Four times a day (QID) | ORAL | Status: DC | PRN

## 2019-05-13 MED ORDER — PRO-STAT SUGAR FREE PO LIQD
30.0000 mL | Freq: Two times a day (BID) | ORAL | Status: DC
  Administered 2019-05-13 – 2019-05-15 (×4): 30 mL via ORAL
  Filled 2019-05-13 (×3): qty 30

## 2019-05-13 MED ORDER — PROMETHAZINE HCL 25 MG/ML IJ SOLN: 12.5000 mg | Freq: Four times a day (QID) | INTRAMUSCULAR | Status: DC | PRN

## 2019-05-13 MED ORDER — POTASSIUM CHLORIDE IN NACL 40-0.9 MEQ/L-% IV SOLN
INTRAVENOUS | Status: AC
  Administered 2019-05-13 – 2019-05-14 (×2): 75 mL/h via INTRAVENOUS
  Filled 2019-05-13 (×2): qty 1000

## 2019-05-13 MED ORDER — ADULT MULTIVITAMIN W/MINERALS CH
1.0000 | ORAL_TABLET | Freq: Every day | ORAL | Status: DC
  Administered 2019-05-13 – 2019-05-15 (×3): 1 via ORAL
  Filled 2019-05-13 (×3): qty 1

## 2019-05-13 MED ORDER — SODIUM CHLORIDE 0.9 % IV SOLN
INTRAVENOUS | Status: DC
  Filled 2019-05-13: qty 1000

## 2019-05-13 MED ORDER — HYDROMORPHONE HCL 1 MG/ML IJ SOLN
1.0000 mg | INTRAMUSCULAR | Status: DC | PRN
  Administered 2019-05-13 – 2019-05-15 (×10): 1 mg via INTRAVENOUS
  Filled 2019-05-13 (×10): qty 1

## 2019-05-13 MED ORDER — POTASSIUM CHLORIDE CRYS ER 10 MEQ PO TBCR
30.0000 meq | EXTENDED_RELEASE_TABLET | ORAL | Status: AC
  Administered 2019-05-13 (×2): 30 meq via ORAL
  Filled 2019-05-13 (×2): qty 3

## 2019-05-13 NOTE — Progress Notes (Signed)
Initial Nutrition Assessment  RD working remotely.   DOCUMENTATION CODES:   Not applicable  INTERVENTION:  - continue Boost Breeze TID, each supplement provides 250 kcal and 9 grams of protein. - will order 30 mL Prostat BID, each supplement provides 100 kcal and 15 grams of protein. - will order daily multivitamin with minerals. - re-advance diet as medically feasible.    NUTRITION DIAGNOSIS:   Inadequate protein intake related to other (see comment)(current diet order) as evidenced by other (comment)(CLD does not meet estimated protein need.).  GOAL:   Patient will meet greater than or equal to 90% of their needs  MONITOR:   PO intake, Supplement acceptance, Diet advancement, Labs, Weight trends  REASON FOR ASSESSMENT:   Malnutrition Screening Tool  ASSESSMENT:   58 year old male with medical history of chronic pancreatitis, alcohol dependence, GERD, and remote history of TB. He presented to the ED with complaints of abdominal pain and nausea without vomiting consistent with prior episodes of pancreatitis and lipase was >300.  Admitted for suspected acute on chronic pancreatitis d/t ongoing alcohol use.  Patient was NPO at time of admission yesterday and then advanced to CLD at 47 AM yesterday. He was again made NPO today at 12:30 PM. Per chart review, current weight is 125 lb and this is the most patient has weighed in the past 5 months. Six months ago, on 10/16/18, he weighed 135 lb. This indicates 10 lb weight loss (7.4% body weight) in the past 6 months which is not significant for time frame.   Boost Breeze was ordered TID yesterday and patient has accepted 2 of the 3 cartons offered to him. Patient was last seen by a RD on 01/08/18 at which time he weighed 111 lb and met criteria for moderate malnutrition related to chronic illness as evidenced by mild fat depletion, mild and moderate muscle depletion.   Per notes: - acute on chronic alcoholic pancreatitis - alcohol  dependence and had reported to MD that he drinks 2 beers/day and that last drink was 2-3 days PTA - hypokalemia with repletion ordered and Mg WDL - hepatitis C positive with plan for outpatient ID follow-up - R should osteoarthritis with plan for surgery for the same on 7/7   Medications reviewed; 30 mEq K-Dur x2 doses today.  Labs reviewed; K: 3.3 mmol/l, lipase: 302 units/L today.    NUTRITION - FOCUSED PHYSICAL EXAM:  unable to complete at this time.  Diet Order:   Diet Order            Diet NPO time specified Except for: Ice Chips, Sips with Meds  Diet effective now              EDUCATION NEEDS:   Not appropriate for education at this time  Skin:  Skin Assessment: Reviewed RN Assessment  Last BM:  7/4  Height:   Ht Readings from Last 1 Encounters:  05/12/19 '5\' 7"'  (1.702 m)    Weight:   Wt Readings from Last 1 Encounters:  05/12/19 56.7 kg    Ideal Body Weight:  67.3 kg  BMI:  Body mass index is 19.58 kg/m.  Estimated Nutritional Needs:   Kcal:  1700-1985 kcal  Protein:  75-90 grams  Fluid:  >/= 2 L/day      Jarome Matin, MS, RD, LDN, [facility] Inpatient Clinical Dietitian Pager # [phone] After hours/weekend pager # [phone]

## 2019-05-13 NOTE — Progress Notes (Addendum)
PROGRESS NOTE   Stephen [name]  QAS:[id number]    DOB: 08-Jun-1961    DOA: 05/12/2019  PCP: Earlene Plater, MD   I have briefly reviewed patients previous medical records in [facility].  Chief Complaint  Patient presents with   Abdominal Pain    Brief Narrative:  58 year old male with PMH of chronic pancreatitis, alcohol dependence, GERD, remote history of TB, presented to the ED with complaints of abdominal pain and nausea without vomiting consistent with prior episodes of pancreatitis and lipase was >300.  Admitted for suspected acute on chronic pancreatitis due to ongoing alcohol use.   Assessment & Plan:   Principal Problem:   Acute alcoholic pancreatitis Active Problems:   Hyponatremia   Hepatitis C antibody test positive   Hypokalemia   Acute on chronic alcoholic pancreatitis  Likely related to ongoing alcohol use.  Lipase 302 on admission.  No acute abdomen on exam.  Alcohol abstinence counseled.  Patient was started on clear liquid diet but has persistent nausea, vomiting and some abdominal pain to an extent that he is unable to tolerate oral intake consistently.  Thereby changed to NPO, changed PPI to IV, added IV Phenergan for intractable nausea and vomiting.  KUB scattered air-fluid levels noted throughout the colon but no evidence of obstruction.  No free air.  Monitor closely and pending improvement of symptoms, consider gradually advancing diet.  If continues to have symptoms or worsening of abdominal pain, may consider CT abdomen with contrast.  Intractable nausea and vomiting  Management as noted above.  Alcohol dependence  Reports that he only drinks 2 beers per day and the last drink was 2 to 3 days PTA.  Denies drinking hard liquor.  No evidence of overt alcohol withdrawal.  GI symptoms not likely related to alcohol withdrawal.  GERD  His abdominal pain could also be related to gastritis/esophagitis or PUD from alcohol.  Given inability  to tolerate oral intake, change PPI to IV.  Hypokalemia  Continue to replace and follow.  Magnesium normal.  Hepatitis C positive  Outpatient follow-up with ID to be arranged by PCP.  Right shoulder osteoarthritis  Scheduled for surgery on 7/7.  I discussed with Dr. Alphonzo Severance on 7/6, surgery will be postponed.   DVT prophylaxis: Lovenox Code Status: Full Family Communication: None at bedside Disposition: Due to ongoing nausea, vomiting and inability to safely keep oral intake consistently down, patient is not medically stable for discharge home.   Consultants:  None  Procedures:  None  Antimicrobials:  None   Subjective: Persistent nausea, nonbloody emesis and some epigastric abdominal pain.  Normal BM without diarrhea.  Reportedly vomited up this morning's pills as well.  Discussed with RN at bedside.  ROS: Above, otherwise negative.  Objective:  Vitals:   05/12/19 0928 05/12/19 1422 05/12/19 2115 05/13/19 0535  BP: (!) 144/109 (!) 150/95 (!) 147/94 137/87  Pulse: 85 71 78 84  Resp: 16 18 18 19   Temp: 98.2 F (36.8 C) 97.9 F (36.6 C) 97.7 F (36.5 C) 97.9 F (36.6 C)  TempSrc: Oral Oral Oral Oral  SpO2: 100% 100% 100% 100%  Weight:      Height:        Examination:  General exam: Pleasant young male, moderately built and nourished seen ambulating comfortably in room.  Oral mucosa with borderline hydration. Respiratory system: Clear to auscultation.  No increased work of breathing. Cardiovascular system: S1 & S2 heard, RRR. No JVD, murmurs, rubs, gallops or clicks. No  pedal edema. Gastrointestinal system: Abdomen is nondistended, soft and minimal epigastric tenderness without rigidity, guarding or rebound.  No organomegaly or masses appreciated.  Normal bowel sounds heard. Central nervous system: Alert and oriented. No focal neurological deficits. Extremities: Symmetric 5 x 5 power. Skin: No rashes, lesions or ulcers Psychiatry: Judgement and insight  appear normal. Mood & affect appropriate.     Data Reviewed: I have personally reviewed following labs and imaging studies  CBC: Recent Labs  Lab 05/08/19 1136 05/12/19 0415 05/13/19 0553  WBC 9.9 11.1* 11.7*  HGB 14.4 16.4 14.6  HCT 42.8 47.2 44.3  MCV 86.1 86.1 89.0  PLT 456* 346 852   Basic Metabolic Panel: Recent Labs  Lab 05/08/19 1145 05/12/19 0415 05/13/19 0553  NA 138 134* 137  K 3.7 3.3* 3.3*  CL 100 93* 99  CO2 27 28 29   GLUCOSE 138* 155* 87  BUN 7 8 6   CREATININE 1.01 0.89 0.82  CALCIUM 9.5 9.8 9.0  MG  --  2.2  --    Liver Function Tests: Recent Labs  Lab 05/08/19 1145 05/12/19 0415 05/13/19 0553  AST 42* 31 23  ALT 28 25 18   ALKPHOS 121 95 78  BILITOT 0.7 1.1 0.7  PROT 7.7 8.6* 7.6  ALBUMIN 4.0 4.5 4.0     Recent Results (from the past 240 hour(s))  Urine culture     Status: None   Collection Time: 05/08/19 11:39 AM   Specimen: Urine, Clean Catch  Result Value Ref Range Status   Specimen Description URINE, CLEAN CATCH  Final   Special Requests NONE  Final   Culture   Final    NO GROWTH Performed at [facility], [place]. [address] [place], [place] [address]    Report Status 05/09/2019 FINAL  Final  Surgical pcr screen     Status: None   Collection Time: 05/08/19 11:40 AM   Specimen: Nasal Mucosa; Nasal Swab  Result Value Ref Range Status   MRSA, PCR NEGATIVE NEGATIVE Final   Staphylococcus aureus NEGATIVE NEGATIVE Final    Comment: (NOTE) The Xpert SA Assay (FDA approved for NASAL specimens in patients 46 years of age and older), is one component of a comprehensive surveillance program. It is not intended to diagnose infection nor to guide or monitor treatment. Performed at [facility], [place] [address] [place], [place] [address]   SARS Coronavirus 2 (Performed in [facility] hospital lab)     Status: None   Collection Time: 05/10/19  9:31 AM   Specimen: Nasal Swab  Result Value Ref Range Status   SARS Coronavirus 2  NEGATIVE NEGATIVE Final    Comment: (NOTE) SARS-CoV-2 target nucleic acids are NOT DETECTED. The SARS-CoV-2 RNA is generally detectable in upper and lower respiratory specimens during the acute phase of infection. Negative results do not preclude SARS-CoV-2 infection, do not rule out co-infections with other pathogens, and should not be used as the sole basis for treatment or other patient management decisions. Negative results must be combined with clinical observations, patient history, and epidemiological information. The expected result is Negative. Fact Sheet for Patients: SugarRoll.be Fact Sheet for Healthcare Providers: https://www.woods-mathews.com/ This test is not yet approved or cleared by the [place] FDA and  has been authorized for detection and/or diagnosis of SARS-CoV-2 by FDA under an Emergency Use Authorization (EUA). This EUA will remain  in effect (meaning this test can be used) for the duration of the COVID-19 declaration under Section 56  4(b)(1) of the Act, 21 U.S.C. section 360bbb-3(b)(1), unless the authorization is terminated or revoked sooner. Performed at [facility], [place] [address] [place], [place] [address]          Radiology Studies: Dg Abd 2 Views  Result Date: 05/13/2019 CLINICAL DATA:  Chronic pancreatitis. EXAM: ABDOMEN - 2 VIEW COMPARISON:  05/12/2019. FINDINGS: Pill fragments noted left upper quadrant. Scattered air-fluid levels are noted throughout the colon and rectosigmoid. Diarrheal illness could present this fashion. No bowel distention or free air. Degenerative changes scoliosis lumbar spine and both hips. Faint sclerotic densities noted over both femoral heads avascular necrosis cannot be excluded. Aortoiliac atherosclerotic vascular calcification. IMPRESSION: 1. Scattered air-fluid levels noted throughout the colon and rectosigmoid. Diarrheal illness could present this fashion. No  evidence of bowel distention. No free air. 2. Faint sclerotic densities noted over both femoral heads. Although these changes may be degenerative, avascular necrosis cannot be excluded. Electronically Signed   By: Marcello Moores  Register   On: 05/13/2019 13:17   Dg Abd Acute 2+v W 1v Chest  Result Date: 05/12/2019 CLINICAL DATA:  Abdominal pain.  History of pancreatitis. EXAM: DG ABDOMEN ACUTE W/ 1V CHEST COMPARISON:  CT chest, abdomen, and pelvis dated October 16, 2018. Chest x-ray dated October 23, 2004. FINDINGS: The cardiomediastinal silhouette is normal in size. Normal pulmonary vascularity. Right upper lobe scarring again noted. No focal consolidation, pleural effusion, or pneumothorax. Interval left reverse total shoulder arthroplasty. There is no evidence of dilated bowel loops or free intraperitoneal air. No radiopaque calculi or other significant radiographic abnormality is seen. Chronic bilateral femoral head avascular necrosis again noted. IMPRESSION: 1. No acute abnormality.  No active cardiopulmonary disease. Electronically Signed   By: Titus Dubin M.D.   On: 05/12/2019 05:43        Scheduled Meds:  enoxaparin (LOVENOX) injection  40 mg Subcutaneous Q24H   feeding supplement  1 Container Oral TID BM   feeding supplement (PRO-STAT SUGAR FREE 64)  30 mL Oral BID   multivitamin with minerals  1 tablet Oral Daily   pantoprazole (PROTONIX) IV  40 mg Intravenous Q24H   sodium chloride flush  3 mL Intravenous Once   Continuous Infusions:   LOS: 1 day     Vernell Leep, MD, FACP, [facility]. [facility]  To contact the attending provider between 7A-7P or the covering provider during after hours 7P-7A, please log into the web site [email or web address] and access using universal [place] password for that web site. If you do not have the password, please call the hospital operator.  05/13/2019, 2:27 PM

## 2019-05-14 ENCOUNTER — Inpatient Hospital Stay (HOSPITAL_COMMUNITY): Admission: RE | Admit: 2019-05-14 | Payer: Non-veteran care | Source: Home / Self Care | Admitting: Orthopedic Surgery

## 2019-05-14 ENCOUNTER — Encounter (HOSPITAL_COMMUNITY): Admission: RE | Payer: Self-pay | Source: Home / Self Care

## 2019-05-14 LAB — BASIC METABOLIC PANEL
Anion gap: 7 (ref 5–15)
BUN: 7 mg/dL (ref 6–20)
CO2: 26 mmol/L (ref 22–32)
Calcium: 8.8 mg/dL — ABNORMAL LOW (ref 8.9–10.3)
Chloride: 103 mmol/L (ref 98–111)
Creatinine, Ser: 0.77 mg/dL (ref 0.61–1.24)
GFR calc Af Amer: >60 mL/min (ref >60)
GFR calc non Af Amer: >60 mL/min (ref >60)
Glucose, Bld: 81 mg/dL (ref 70–99)
Potassium: 5.2 mmol/L — ABNORMAL HIGH (ref 3.5–5.1)
Sodium: 136 mmol/L (ref 135–145)

## 2019-05-14 LAB — LIPASE, BLOOD: Lipase: 69 U/L — ABNORMAL HIGH (ref 11–51)

## 2019-05-14 LAB — NOVEL CORONAVIRUS, NAA (HOSP ORDER, SEND-OUT TO REF LAB; TAT 18-24 HRS): SARS-CoV-2, NAA: NOT DETECTED

## 2019-05-14 SURGERY — ARTHROPLASTY, SHOULDER, TOTAL, REVERSE
Anesthesia: General | Laterality: Right

## 2019-05-14 MED ORDER — TRAZODONE HCL 50 MG PO TABS: 50.0000 mg | ORAL_TABLET | Freq: Every evening | ORAL | Status: DC | PRN

## 2019-05-14 NOTE — Progress Notes (Signed)
PROGRESS NOTE  Stephen [name]:[id number] DOB: 1960/12/29 DOA: 05/12/2019 PCP: Earlene Plater, MD  HPI/Recap of past 35 hours: 58 year old male with PMH of chronic pancreatitis, alcohol dependence, GERD, remote history of TB, presented to the ED with complaints of abdominal pain and nausea without vomiting consistent with prior episodes of pancreatitis and lipase was >300.  Admitted for suspected acute on chronic pancreatitis due to ongoing alcohol use.  05/14/19: Patient seen and examined at his bedside.  Reports persistent nausea with no vomiting this morning.  Abdominal tenderness present on exam.  Assessment/Plan: Principal Problem:   Acute alcoholic pancreatitis Active Problems:   Hyponatremia   Hepatitis C antibody test positive   Hypokalemia   Acute on chronic alcoholic pancreatitis Presented with lipase 302 Lipase level improved to 69 on 05/14/2019 Currently n.p.o. Advance diet to clear liquid as tolerated Continue antiemetic Continue IV fluid hydration Continue pain management as needed  Intractable nausea and vomiting Persistent nausea with no vomiting this morning Continue antiemetics We will switch to D5 half-normal saline if cannot tolerate oral  Alcohol use disorder with concern for withdrawal No obvious sign of alcohol withdrawal at this time Continue CIWA protocol  Hypokalemia Repleted with IV potassium  Hepatitis C positive Follow-up with ID outpatient, to be arranged by PCP  Right shoulder osteoarthritis Scheduled for surgery on 05/14/19 by Dr. Alphonzo Severance, surgery will be postponed   Code Status: Full Family Communication: None at bedside Disposition:  Possible discharge to home tomorrow if can tolerate a soft diet.  Consultants:  None  Procedures:  None  Antimicrobials:  None   Objective: Vitals:   05/12/19 2115 05/13/19 0535 05/13/19 2243 05/14/19 0626  BP: (!) 147/94 137/87 129/90 137/82  Pulse: 78 84 89 80  Resp: 18 19 16 16    Temp: 97.7 F (36.5 C) 97.9 F (36.6 C) 98.4 F (36.9 C) 98 F (36.7 C)  TempSrc: Oral Oral Oral Oral  SpO2: 100% 100% 100% 100%  Weight:      Height:        Intake/Output Summary (Last 24 hours) at 05/14/2019 0706 Last data filed at 05/14/2019 4270 Gross per 24 hour  Intake 802.23 ml  Output -  Net 802.23 ml   Filed Weights   05/12/19 0349  Weight: 56.7 kg    Exam:  . General: 58 y.o. year-old male well developed well nourished in no acute distress.  Alert and oriented x3. . Cardiovascular: Regular rate and rhythm with no rubs or gallops.  No thyromegaly or JVD noted.   Marland Kitchen Respiratory: Clear to auscultation with no wheezes or rales. Good inspiratory effort. . Abdomen: Soft mild tenderness on palpation periumbilically.  With normal bowel sounds x4 quadrants. . Musculoskeletal: No lower extremity edema. 2/4 pulses in all 4 extremities. Marland Kitchen Psychiatry: Mood is appropriate for condition and setting   Data Reviewed: CBC: Recent Labs  Lab 05/08/19 1136 05/12/19 0415 05/13/19 0553  WBC 9.9 11.1* 11.7*  HGB 14.4 16.4 14.6  HCT 42.8 47.2 44.3  MCV 86.1 86.1 89.0  PLT 456* 346 623   Basic Metabolic Panel: Recent Labs  Lab 05/08/19 1145 05/12/19 0415 05/13/19 0553 05/14/19 0522  NA 138 134* 137 136  K 3.7 3.3* 3.3* PENDING  CL 100 93* 99 103  CO2 27 28 29 26   GLUCOSE 138* 155* 87 81  BUN 7 8 6 7   CREATININE 1.01 0.89 0.82 0.77  CALCIUM 9.5 9.8 9.0 8.8*  MG  --  2.2  --   --  GFR: Estimated Creatinine Clearance: 79.7 mL/min (by C-G formula based on SCr of 0.77 mg/dL). Liver Function Tests: Recent Labs  Lab 05/08/19 1145 05/12/19 0415 05/13/19 0553  AST 42* 31 23  ALT 28 25 18   ALKPHOS 121 95 78  BILITOT 0.7 1.1 0.7  PROT 7.7 8.6* 7.6  ALBUMIN 4.0 4.5 4.0   Recent Labs  Lab 05/12/19 0415 05/14/19 0522  LIPASE 302* 69*   No results for input(s): AMMONIA in the last 168 hours. Coagulation Profile: No results for input(s): INR, PROTIME in the last  168 hours. Cardiac Enzymes: No results for input(s): CKTOTAL, CKMB, CKMBINDEX, TROPONINI in the last 168 hours. BNP (last 3 results) No results for input(s): PROBNP in the last 8760 hours. HbA1C: No results for input(s): HGBA1C in the last 72 hours. CBG: No results for input(s): GLUCAP in the last 168 hours. Lipid Profile: No results for input(s): CHOL, HDL, LDLCALC, TRIG, CHOLHDL, LDLDIRECT in the last 72 hours. Thyroid Function Tests: No results for input(s): TSH, T4TOTAL, FREET4, T3FREE, THYROIDAB in the last 72 hours. Anemia Panel: No results for input(s): VITAMINB12, FOLATE, FERRITIN, TIBC, IRON, RETICCTPCT in the last 72 hours. Urine analysis:    Component Value Date/Time   COLORURINE YELLOW 05/12/2019 0415   APPEARANCEUR HAZY (A) 05/12/2019 0415   LABSPEC 1.026 05/12/2019 0415   PHURINE 6.0 05/12/2019 0415   GLUCOSEU NEGATIVE 05/12/2019 0415   HGBUR SMALL (A) 05/12/2019 0415   BILIRUBINUR MODERATE (A) 05/12/2019 0415   KETONESUR 20 (A) 05/12/2019 0415   PROTEINUR 100 (A) 05/12/2019 0415   UROBILINOGEN 1.0 04/14/2015 1728   NITRITE NEGATIVE 05/12/2019 0415   LEUKOCYTESUR NEGATIVE 05/12/2019 0415   Sepsis Labs: @LABRCNTIP (procalcitonin:4,lacticidven:4)  ) Recent Results (from the past 240 hour(s))  Urine culture     Status: None   Collection Time: 05/08/19 11:39 AM   Specimen: Urine, Clean Catch  Result Value Ref Range Status   Specimen Description URINE, CLEAN CATCH  Final   Special Requests NONE  Final   Culture   Final    NO GROWTH Performed at [facility], [place] [address], [place], [place] [address]    Report Status 05/09/2019 FINAL  Final  Surgical pcr screen     Status: None   Collection Time: 05/08/19 11:40 AM   Specimen: Nasal Mucosa; Nasal Swab  Result Value Ref Range Status   MRSA, PCR NEGATIVE NEGATIVE Final   Staphylococcus aureus NEGATIVE NEGATIVE Final    Comment: (NOTE) The Xpert SA Assay (FDA approved for NASAL specimens in patients 5  years of age and older), is one component of a comprehensive surveillance program. It is not intended to diagnose infection nor to guide or monitor treatment. Performed at [facility], [place] [address], [place], [place] [address]   SARS Coronavirus 2 (Performed in [facility] hospital lab)     Status: None   Collection Time: 05/10/19  9:31 AM   Specimen: Nasal Swab  Result Value Ref Range Status   SARS Coronavirus 2 NEGATIVE NEGATIVE Final    Comment: (NOTE) SARS-CoV-2 target nucleic acids are NOT DETECTED. The SARS-CoV-2 RNA is generally detectable in upper and lower respiratory specimens during the acute phase of infection. Negative results do not preclude SARS-CoV-2 infection, do not rule out co-infections with other pathogens, and should not be used as the sole basis for treatment or other patient management decisions. Negative results must be combined with clinical observations, patient history, and epidemiological information. The expected result is Negative.  Fact Sheet for Patients: SugarRoll.be Fact Sheet for Healthcare Providers: https://www.woods-mathews.com/ This test is not yet approved or cleared by the [place] FDA and  has been authorized for detection and/or diagnosis of SARS-CoV-2 by FDA under an Emergency Use Authorization (EUA). This EUA will remain  in effect (meaning this test can be used) for the duration of the COVID-19 declaration under Section 56 4(b)(1) of the Act, 21 U.S.C. section 360bbb-3(b)(1), unless the authorization is terminated or revoked sooner. Performed at [facility], [place] [address], [place], [place] [address]   Novel Coronavirus,NAA,(SEND-OUT TO REF LAB - TAT 24-48 hrs); Hosp Order     Status: None   Collection Time: 05/12/19  6:44 AM   Specimen: Nasopharyngeal Swab; Respiratory  Result Value Ref Range Status   SARS-CoV-2, NAA NOT DETECTED NOT DETECTED Final    Comment: (NOTE)  This test was developed and its performance characteristics determined by [employer]. This test has not been FDA cleared or approved. This test has been authorized by FDA under an Emergency Use Authorization (EUA). This test is only authorized for the duration of time the declaration that circumstances exist justifying the authorization of the emergency use of in vitro diagnostic tests for detection of SARS-CoV-2 virus and/or diagnosis of COVID-19 infection under section 564(b)(1) of the Act, 21 U.S.C. 408XKG-8(J)(8), unless the authorization is terminated or revoked sooner. When diagnostic testing is negative, the possibility of a false negative result should be considered in the context of a patient's recent exposures and the presence of clinical signs and symptoms consistent with COVID-19. An individual without symptoms of COVID-19 and who is not shedding SARS-CoV-2 virus would expect to have a negative (not detected) result in this assay. Performed  At: [facility] [place], [place] [id number] Rush Farmer MD OV:[id number]    [place]  Final    Comment: Performed at [place] [address], [place], [place] [address]      Studies: Dg Abd 2 Views  Result Date: 05/13/2019 CLINICAL DATA:  Chronic pancreatitis. EXAM: ABDOMEN - 2 VIEW COMPARISON:  05/12/2019. FINDINGS: Pill fragments noted left upper quadrant. Scattered air-fluid levels are noted throughout the colon and rectosigmoid. Diarrheal illness could present this fashion. No bowel distention or free air. Degenerative changes scoliosis lumbar spine and both hips. Faint sclerotic densities noted over both femoral heads avascular necrosis cannot be excluded. Aortoiliac atherosclerotic vascular calcification. IMPRESSION: 1. Scattered air-fluid levels noted throughout the colon and rectosigmoid. Diarrheal illness could present this fashion. No evidence  of bowel distention. No free air. 2. Faint sclerotic densities noted over both femoral heads. Although these changes may be degenerative, avascular necrosis cannot be excluded. Electronically Signed   By: Marcello Moores  Register   On: 05/13/2019 13:17    Scheduled Meds: . enoxaparin (LOVENOX) injection  40 mg Subcutaneous Q24H  . feeding supplement  1 Container Oral TID BM  . feeding supplement (PRO-STAT SUGAR FREE 64)  30 mL Oral BID  . multivitamin with minerals  1 tablet Oral Daily  . pantoprazole (PROTONIX) IV  40 mg Intravenous Q24H    Continuous Infusions: . 0.9 % NaCl with KCl 40 mEq / L 75 mL/hr (05/14/19 0544)     LOS: 2 days     Kayleen Memos, MD [facility] Pager [phone]  If 7PM-7AM, please contact night-coverage [email or web address] Password [email or web address] 05/14/2019, 7:06 AM

## 2019-05-15 LAB — BASIC METABOLIC PANEL
Anion gap: 9 (ref 5–15)
BUN: 9 mg/dL (ref 6–20)
CO2: 26 mmol/L (ref 22–32)
Calcium: 9.2 mg/dL (ref 8.9–10.3)
Chloride: 101 mmol/L (ref 98–111)
Creatinine, Ser: 0.75 mg/dL (ref 0.61–1.24)
GFR calc Af Amer: >60 mL/min (ref >60)
GFR calc non Af Amer: >60 mL/min (ref >60)
Glucose, Bld: 100 mg/dL — ABNORMAL HIGH (ref 70–99)
Potassium: 4 mmol/L (ref 3.5–5.1)
Sodium: 136 mmol/L (ref 135–145)

## 2019-05-15 LAB — CBC
HCT: 41.3 % (ref 39.0–52.0)
Hemoglobin: 13.3 g/dL (ref 13.0–17.0)
MCH: 29.2 pg (ref 26.0–34.0)
MCHC: 32.2 g/dL (ref 30.0–36.0)
MCV: 90.6 fL (ref 80.0–100.0)
Platelets: 271 10*3/uL (ref 150–400)
RBC: 4.56 MIL/uL (ref 4.22–5.81)
RDW: 14.9 % (ref 11.5–15.5)
WBC: 9.4 10*3/uL (ref 4.0–10.5)
nRBC: 0 % (ref 0.0–0.2)

## 2019-05-15 LAB — LIPASE, BLOOD: Lipase: 57 U/L — ABNORMAL HIGH (ref 11–51)

## 2019-05-15 MED ORDER — PANTOPRAZOLE SODIUM 40 MG PO TBEC: 40.0000 mg | DELAYED_RELEASE_TABLET | Freq: Every day | ORAL | 0 refills | Status: DC

## 2019-05-15 MED ORDER — ADULT MULTIVITAMIN W/MINERALS CH: 1.0000 | ORAL_TABLET | Freq: Every day | ORAL | 0 refills | Status: DC

## 2019-05-15 MED ORDER — OXYCODONE HCL 5 MG PO TABS: 5.0000 mg | ORAL_TABLET | Freq: Two times a day (BID) | ORAL | 0 refills | Status: DC | PRN

## 2019-05-15 MED ORDER — PANTOPRAZOLE SODIUM 40 MG PO TBEC
40.0000 mg | DELAYED_RELEASE_TABLET | Freq: Every day | ORAL | Status: DC
  Administered 2019-05-15: 08:00:00 40 mg via ORAL
  Filled 2019-05-15: qty 1

## 2019-05-15 NOTE — Discharge Summary (Signed)
Discharge Summary  Stephen [name] WFU:[id number] DOB: 09-16-1961  PCP: Earlene Plater, MD  Admit date: 05/12/2019 Discharge date: 05/15/2019  Time spent: 35 minutes  Recommendations for Outpatient Follow-up:  1. Follow-up with your primary care provider. 2. Follow-up with infectious disease, obtain referral from your PCP. 3. Take your medications as prescribed. 4. Completely abstain from alcohol use.  Discharge Diagnoses:  Active Hospital Problems   Diagnosis Date Noted   Acute alcoholic pancreatitis 20/25/4270   Hypokalemia 05/12/2019   Hepatitis C antibody test positive 01/19/2018   Hyponatremia 01/06/2018    Resolved Hospital Problems  No resolved problems to display.    Discharge Condition: Stable  Diet recommendation: Resume previous diet  Vitals:   05/14/19 2123 05/15/19 0645  BP: (!) 140/95 134/82  Pulse: 72 86  Resp: 16 16  Temp: 98.7 F (37.1 C) 97.9 F (36.6 C)  SpO2: 100% 99%    History of present illness:  58 year old male with PMH of chronic pancreatitis, alcohol dependence, GERD, remote history of TB, presented to the ED with complaints of abdominal pain and nausea without vomiting consistent with prior episodes of pancreatitis and lipase was >300. Admitted for acute on chronic pancreatitis likely secondary to ongoing alcohol use. Initially n.p.o. with IV fluid hydration, antiemetics, and optimal pain control.  Then on clear liquid diet which he tolerated, advanced to soft diet.  Lipase level continued to trend down, 302>> 69 >> 57.  05/15/19: Patient seen and examined at his bedside.  No acute events overnight.  States he feels better today.  Denies nausea and has minimal abdominal pain.  On soft diet and tolerating well.   Hospital Course:  Principal Problem:   Acute alcoholic pancreatitis Active Problems:   Hyponatremia   Hepatitis C antibody test positive   Hypokalemia  Acute on chronic alcoholic pancreatitis Presented with lipase  302 Lipase level improved to 69 on 05/14/2019 then to 57 on 05/15/19 Advance diet from clear liquid to soft diet and tolerated well Denies nausea with minimal abdominal pain.  Resolved intractable nausea and vomiting  Alcohol use disorder with concern for withdrawal No sign of alcohol withdrawal at this time. Alcohol cessation counseling done at bedside.  Hypokalemia, resolved post repletion  Hepatitis C positive Follow-up with ID outpatient, to be arranged by PCP  Right shoulder osteoarthritis Scheduled for surgery on 05/14/19 by Dr. Alphonzo Severance, surgery will be postponed   Code Status:Full  Consultants: None  Procedures: None  Antimicrobials: None  Discharge Exam: BP 134/82 (BP Location: Left Arm)    Pulse 86    Temp 97.9 F (36.6 C) (Oral)    Resp 16    Ht 5\' 7"  (1.702 m)    Wt 56.7 kg    SpO2 99%    BMI 19.58 kg/m   General: 58 y.o. year-old male well developed well nourished in no acute distress.  Alert and oriented x3.  Cardiovascular: Regular rate and rhythm with no rubs or gallops.  No thyromegaly or JVD noted.    Respiratory: Clear to auscultation with no wheezes or rales. Good inspiratory effort.  Abdomen: Soft with very mild tenderness with moderate palpation periumbilically.  Nondistended with normal bowel sounds x4 quadrants.  Musculoskeletal: No lower extremity edema. 2/4 pulses in all 4 extremities.  Psychiatry: Mood is appropriate for condition and setting  Discharge Instructions You were cared for by a hospitalist during your hospital stay. If you have any questions about your discharge medications or the care you received while you were  in the hospital after you are discharged, you can call the unit and asked to speak with the hospitalist on call if the hospitalist that took care of you is not available. Once you are discharged, your primary care physician will handle any further medical issues. Please note that NO REFILLS for any discharge  medications will be authorized once you are discharged, as it is imperative that you return to your primary care physician (or establish a relationship with a primary care physician if you do not have one) for your aftercare needs so that they can reassess your need for medications and monitor your lab values.   Allergies as of 05/15/2019   No Known Allergies     Medication List    STOP taking these medications   aspirin 81 MG EC tablet   HYDROcodone-acetaminophen 5-325 MG tablet Commonly known as: NORCO/VICODIN   traMADol 50 MG tablet Commonly known as: ULTRAM     TAKE these medications   multivitamin with minerals Tabs tablet Take 1 tablet by mouth daily.   oxyCODONE 5 MG immediate release tablet Commonly known as: Oxy IR/ROXICODONE Take 1 tablet (5 mg total) by mouth 2 (two) times daily as needed for severe pain or breakthrough pain. What changed:   when to take this  reasons to take this   pantoprazole 40 MG tablet Commonly known as: Protonix Take 1 tablet (40 mg total) by mouth daily.      No Known Allergies Follow-up Information    Earlene Plater, MD. Call in 1 day(s).   Specialty: Internal Medicine Why: Please call for a post hospital follow-up appointment.  Please obtain infectious disease referral from your primary care provider. Contact information: [address] [place] [address] [phone]            The results of significant diagnostics from this hospitalization (including imaging, microbiology, ancillary and laboratory) are listed below for reference.    Significant Diagnostic Studies: Dg Abd 2 Views  Result Date: 05/13/2019 CLINICAL DATA:  Chronic pancreatitis. EXAM: ABDOMEN - 2 VIEW COMPARISON:  05/12/2019. FINDINGS: Pill fragments noted left upper quadrant. Scattered air-fluid levels are noted throughout the colon and rectosigmoid. Diarrheal illness could present this fashion. No bowel distention or free air.  Degenerative changes scoliosis lumbar spine and both hips. Faint sclerotic densities noted over both femoral heads avascular necrosis cannot be excluded. Aortoiliac atherosclerotic vascular calcification. IMPRESSION: 1. Scattered air-fluid levels noted throughout the colon and rectosigmoid. Diarrheal illness could present this fashion. No evidence of bowel distention. No free air. 2. Faint sclerotic densities noted over both femoral heads. Although these changes may be degenerative, avascular necrosis cannot be excluded. Electronically Signed   By: Marcello Moores  Register   On: 05/13/2019 13:17   Dg Abd Acute 2+v W 1v Chest  Result Date: 05/12/2019 CLINICAL DATA:  Abdominal pain.  History of pancreatitis. EXAM: DG ABDOMEN ACUTE W/ 1V CHEST COMPARISON:  CT chest, abdomen, and pelvis dated October 16, 2018. Chest x-ray dated October 23, 2004. FINDINGS: The cardiomediastinal silhouette is normal in size. Normal pulmonary vascularity. Right upper lobe scarring again noted. No focal consolidation, pleural effusion, or pneumothorax. Interval left reverse total shoulder arthroplasty. There is no evidence of dilated bowel loops or free intraperitoneal air. No radiopaque calculi or other significant radiographic abnormality is seen. Chronic bilateral femoral head avascular necrosis again noted. IMPRESSION: 1. No acute abnormality.  No active cardiopulmonary disease. Electronically Signed   By: Titus Dubin M.D.   On: 05/12/2019  05:43    Microbiology: Recent Results (from the past 240 hour(s))  Urine culture     Status: None   Collection Time: 05/08/19 11:39 AM   Specimen: Urine, Clean Catch  Result Value Ref Range Status   Specimen Description URINE, CLEAN CATCH  Final   Special Requests NONE  Final   Culture   Final    NO GROWTH Performed at [facility], [address], [place], [place] [address]    Report Status 05/09/2019 FINAL  Final  Surgical pcr screen     Status: None   Collection Time:  05/08/19 11:40 AM   Specimen: Nasal Mucosa; Nasal Swab  Result Value Ref Range Status   MRSA, PCR NEGATIVE NEGATIVE Final   Staphylococcus aureus NEGATIVE NEGATIVE Final    Comment: (NOTE) The Xpert SA Assay (FDA approved for NASAL specimens in patients 52 years of age and older), is one component of a comprehensive surveillance program. It is not intended to diagnose infection nor to guide or monitor treatment. Performed at [facility], [place] [address], [place], [place] [address]   SARS Coronavirus 2 (Performed in [facility] hospital lab)     Status: None   Collection Time: 05/10/19  9:31 AM   Specimen: Nasal Swab  Result Value Ref Range Status   SARS Coronavirus 2 NEGATIVE NEGATIVE Final    Comment: (NOTE) SARS-CoV-2 target nucleic acids are NOT DETECTED. The SARS-CoV-2 RNA is generally detectable in upper and lower respiratory specimens during the acute phase of infection. Negative results do not preclude SARS-CoV-2 infection, do not rule out co-infections with other pathogens, and should not be used as the sole basis for treatment or other patient management decisions. Negative results must be combined with clinical observations, patient history, and epidemiological information. The expected result is Negative. Fact Sheet for Patients: SugarRoll.be Fact Sheet for Healthcare Providers: https://www.woods-mathews.com/ This test is not yet approved or cleared by the [place] FDA and  has been authorized for detection and/or diagnosis of SARS-CoV-2 by FDA under an Emergency Use Authorization (EUA). This EUA will remain  in effect (meaning this test can be used) for the duration of the COVID-19 declaration under Section 56 4(b)(1) of the Act, 21 U.S.C. section 360bbb-3(b)(1), unless the authorization is terminated or revoked sooner. Performed at [facility], [place] [address] [place], [place] [address]   Novel  Coronavirus,NAA,(SEND-OUT TO REF LAB - TAT 24-48 hrs); Hosp Order     Status: None   Collection Time: 05/12/19  6:44 AM   Specimen: Nasopharyngeal Swab; Respiratory  Result Value Ref Range Status   SARS-CoV-2, NAA NOT DETECTED NOT DETECTED Final    Comment: (NOTE) This test was developed and its performance characteristics determined by [employer]. This test has not been FDA cleared or approved. This test has been authorized by FDA under an Emergency Use Authorization (EUA). This test is only authorized for the duration of time the declaration that circumstances exist justifying the authorization of the emergency use of in vitro diagnostic tests for detection of SARS-CoV-2 virus and/or diagnosis of COVID-19 infection under section 564(b)(1) of the Act, 21 U.S.C. 790WIO-9(B)(3), unless the authorization is terminated or revoked sooner. When diagnostic testing is negative, the possibility of a false negative result should be considered in the context of a patient's recent exposures and the presence of clinical signs and symptoms consistent with COVID-19. An individual without symptoms of COVID-19 and who is not shedding SARS-CoV-2 virus would expect to  have a negative (not detected) result in this assay. Performed  At: [facility] [place], [place] [id number] Rush Farmer MD KU:[id number]    [place]  Final    Comment: Performed at [facility] [place] [address] [place], [place] [address]     Labs: Basic Metabolic Panel: Recent Labs  Lab 05/08/19 1145 05/12/19 0415 05/13/19 0553 05/14/19 0522 05/15/19 0612  NA 138 134* 137 136 136  K 3.7 3.3* 3.3* 5.2* 4.0  CL 100 93* 99 103 101  CO2 27 28 29 26 26   GLUCOSE 138* 155* 87 81 100*  BUN 7 8 6 7 9   CREATININE 1.01 0.89 0.82 0.77 0.75  CALCIUM 9.5 9.8 9.0 8.8* 9.2  MG  --  2.2  --   --   --    Liver Function Tests: Recent Labs  Lab 05/08/19 1145  05/12/19 0415 05/13/19 0553  AST 42* 31 23  ALT 28 25 18   ALKPHOS 121 95 78  BILITOT 0.7 1.1 0.7  PROT 7.7 8.6* 7.6  ALBUMIN 4.0 4.5 4.0   Recent Labs  Lab 05/12/19 0415 05/14/19 0522 05/15/19 0612  LIPASE 302* 69* 57*   No results for input(s): AMMONIA in the last 168 hours. CBC: Recent Labs  Lab 05/08/19 1136 05/12/19 0415 05/13/19 0553 05/15/19 0612  WBC 9.9 11.1* 11.7* 9.4  HGB 14.4 16.4 14.6 13.3  HCT 42.8 47.2 44.3 41.3  MCV 86.1 86.1 89.0 90.6  PLT 456* 346 280 271   Cardiac Enzymes: No results for input(s): CKTOTAL, CKMB, CKMBINDEX, TROPONINI in the last 168 hours. BNP: BNP (last 3 results) No results for input(s): BNP in the last 8760 hours.  ProBNP (last 3 results) No results for input(s): PROBNP in the last 8760 hours.  CBG: No results for input(s): GLUCAP in the last 168 hours.     Signed:  Kayleen Memos, MD [facility] 05/15/2019, 7:37 AM

## 2019-05-15 NOTE — Discharge Instructions (Signed)
Abdominal Pain, Adult  Many things can cause belly (abdominal) pain. Most times, belly pain is not dangerous. Many cases of belly pain can be watched and treated at home. Sometimes belly pain is serious, though. Your doctor will try to find the cause of your belly pain. Follow these instructions at home:  Take over-the-counter and prescription medicines only as told by your doctor. Do not take medicines that help you poop (laxatives) unless told to by your doctor.  Drink enough fluid to keep your pee (urine) clear or pale yellow.  Watch your belly pain for any changes.  Keep all follow-up visits as told by your doctor. This is important. Contact a doctor if:  Your belly pain changes or gets worse.  You are not hungry, or you lose weight without trying.  You are having trouble pooping (constipated) or have watery poop (diarrhea) for more than 2-3 days.  You have pain when you pee or poop.  Your belly pain wakes you up at night.  Your pain gets worse with meals, after eating, or with certain foods.  You are throwing up and cannot keep anything down.  You have a fever. Get help right away if:  Your pain does not go away as soon as your doctor says it should.  You cannot stop throwing up.  Your pain is only in areas of your belly, such as the right side or the left lower part of the belly.  You have bloody or black poop, or poop that looks like tar.  You have very bad pain, cramping, or bloating in your belly.  You have signs of not having enough fluid or water in your body (dehydration), such as: ? Dark pee, very little pee, or no pee. ? Cracked lips. ? Dry mouth. ? Sunken eyes. ? Sleepiness. ? Weakness. This information is not intended to replace advice given to you by your health care provider. Make sure you discuss any questions you have with your health care provider. Document Released: 04/11/2008 Document Revised: 05/13/2016 Document Reviewed: 04/06/2016 Elsevier  Interactive Patient Education  2020 [employer].   Acute Pancreatitis  Acute pancreatitis happens when the pancreas gets swollen. The pancreas is a large gland in the body that helps to control blood sugar. It also makes enzymes that help to digest food. This condition can last a few days and cause serious problems. The lungs, heart, and kidneys may stop working. What are the causes? Causes include:  Alcohol abuse.  Drug abuse.  Gallstones.  A tumor in the pancreas. Other causes include:  Some medicines.  Some chemicals.  Diabetes.  An infection.  Damage caused by an accident.  The poison (venom) from a scorpion bite.  Belly (abdominal) surgery.  The body's defense system (immune system) attacking the pancreas (autoimmune pancreatitis).  Genes that are passed from parent to child (inherited). In some cases, the cause is not known. What are the signs or symptoms?  Pain in the upper belly that may be felt in the back. The pain may be very bad.  Swelling of the belly.  Feeling sick to your stomach (nauseous) and throwing up (vomiting).  Fever. How is this treated? You will likely have to stay in the hospital. Treatment may include:  Pain medicine.  Fluid through an IV tube.  Placing a tube in the stomach to take out the stomach contents. This may help you stop throwing up.  Not eating for 3-4 days.  Antibiotic medicines, if you have an infection.  Treating any other problems that may be the cause.  Steroid medicines, if your problem is caused by your defense system attacking your body's own tissues.  Surgery. Follow these instructions at home: Eating and drinking   Follow instructions from your doctor about what to eat and drink.  Eat foods that do not have a lot of fat in them.  Eat small meals often. Do not eat big meals.  Drink enough fluid to keep your pee (urine) pale yellow.  Do not drink alcohol if it caused your  condition. Medicines  Take over-the-counter and prescription medicines only as told by your doctor.  Ask your doctor if the medicine prescribed to you: ? Requires you to avoid driving or using heavy machinery. ? Can cause trouble pooping (constipation). You may need to take steps to prevent or treat trouble pooping:  Take over-the-counter or prescription medicines.  Eat foods that are high in fiber. These include beans, whole grains, and fresh fruits and vegetables.  Limit foods that are high in fat and sugar. These include fried or sweet foods. General instructions  Do not use any products that contain nicotine or tobacco, such as cigarettes, e-cigarettes, and chewing tobacco. If you need help quitting, ask your doctor.  Get plenty of rest.  Check your blood sugar at home as told by your doctor.  Keep all follow-up visits as told by your doctor. This is important. Contact a doctor if:  You do not get better as quickly as expected.  You have new symptoms.  Your symptoms get worse.  You have pain or weakness that lasts a long time.  You keep feeling sick to your stomach.  You get better and then you have pain again.  You have a fever. Get help right away if:  You cannot eat or keep fluids down.  Your pain gets very bad.  Your skin or the white part of your eyes turns yellow.  You have sudden swelling in your belly.  You throw up.  You feel dizzy or you pass out (faint).  Your blood sugar is high (over 300 mg/dL). Summary  Acute pancreatitis happens when the pancreas gets swollen.  This condition is often caused by alcohol abuse, drug abuse, or gallstones.  You will likely have to stay in the hospital for treatment. This information is not intended to replace advice given to you by your health care provider. Make sure you discuss any questions you have with your health care provider. Document Released: 04/11/2008 Document Revised: 08/13/2018 Document  Reviewed: 08/13/2018 Elsevier Patient Education  [employer].   Chronic Pancreatitis  Chronic pancreatitis is long-lasting inflammation and scarring of the pancreas. The pancreas is a gland that is located behind the stomach. It makes enzymes that help to digest food. The pancreas also releases hormones called glucagon and insulin, which help regulate blood sugar (glucose). Damage to the pancreas may affect digestion, cause pain in the upper abdomen and back, and cause diabetes. Inflammation can also irritate other organs in the abdomen near the pancreas. At first, pancreatitis may be sudden (acute). If you have several or prolonged episodes of acute pancreatitis, the condition can turn into chronic pancreatitis. What are the causes? The most common cause of this condition is alcohol abuse. Other causes include:  High (elevated) levels of triglycerides in the blood (hypertriglyceridemia).  Gallstones or other conditions that can block the tube that drains the pancreas (pancreatic duct).  Pancreatic cancer.  Cystic fibrosis.  Too much calcium in  the blood (hypercalcemia), which may be caused by an overactive parathyroid gland (hyperparathyroidism).  Certain medicines.  Injury to the pancreas.  Infection.  Autoimmune pancreatitis. This is when the body's disease-fighting (immune) system attacks the pancreas.  Genes that are passed from parent to child (inherited). In some cases, the cause may not be known. What increases the risk? This condition is more likely to develop in:  Men.  People who are 25-56 years old.  People who have a family history of pancreatitis.  People who smoke tobacco.  People who drink large amounts of alcohol over a long period of time. What are the signs or symptoms? Symptoms of this condition may include:  Pain in the abdomen or upper back. Pain may get worse after eating.  Nausea and vomiting.  Fever.  Weight loss.  A change in the  color and consistency of bowel movements, such as stools that are oily, fatty, or clay-colored. How is this diagnosed? This condition is diagnosed based on your symptoms, your medical history, and a physical exam. You may have tests, such as:  Blood tests.  Stool samples.  Biopsy of the pancreas. This is the removal of a small amount of pancreas tissue to be tested in a lab.  Imaging tests, such as: ? X-rays. ? CT scan. ? MRI. ? Ultrasound. How is this treated? You may need to be treated at a hospital. Treatment may involve:  Resting the pancreas. You may need to stop eating and drinking for a few days to give your pancreas time to recover. During this time, you will be given IV fluids to keep you hydrated.  Controlling pain. You may be given pain medicines by mouth (orally) or as injections.  Improving digestion. You may be given: ? Medicines to replace your pancreatic enzymes. ? Vitamin supplements. ? A specific diet to follow. You may work with a diet and nutrition specialist (dietitian) to make an eating plan.  Surgery to: ? Clear the pancreatic ducts of any blockages, such as gallstones. ? Remove any fluid or damaged tissue from the pancreas. Other treatments may include:  Preventing diabetes. Your health care provider may recommend that you: ? Get regular screening tests for diabetes. ? Monitor your blood glucose regularly.  Lifestyle changes, such as stopping alcohol use.  Steroid medicines, if your condition is caused by your immune system attacking your body's own tissues (autoimmune disease). Follow these instructions at home: Eating and drinking      Do not drink alcohol. If you need help quitting, ask your health care provider.  Follow a diet as told by your health care provider or dietitian, if this applies. This may include: ? Limiting how much fat you eat. ? Eating smaller meals more often. ? Avoiding caffeine.  Drink enough fluid to keep your urine  pale yellow. General instructions  Take over-the-counter and prescription medicines only as told by your health care provider. These include vitamin supplements.  Do not drive or use heavy machinery while taking prescription pain medicine.  If you are taking prescription pain medicine, take actions to prevent or treat constipation. Your health care provider may recommend that you: ? Take an over-the-counter or prescription medicine for constipation. ? Eat foods that are high in fiber such as whole grains and beans. ? Limit foods that are high in fat and processed sugars, such as fried or sweet foods.  Do not use any products that contain nicotine or tobacco, such as cigarettes and e-cigarettes. If you need  help quitting, ask your health care provider.  If recommended by your health care provider, monitor your blood glucose at home.  Keep all follow-up visits as told by your health care provider. This is important. Contact a health care provider if:  You have pain that does not get better with medicine.  You have a fever.  You have sudden weight loss. Get help right away if:  Your pain suddenly gets worse.  You have sudden swelling in your abdomen.  You start to vomit often.  You vomit blood.  You have diarrhea that does not go away.  You have blood in your stool.  You become confused or you have trouble thinking clearly. Summary  Chronic pancreatitis is long-lasting inflammation and scarring of the pancreas. Damage to the pancreas may affect digestion, cause pain in the upper abdomen and back, and cause diabetes. Inflammation can also irritate other organs in the abdomen near the pancreas.  Common causes of this condition are alcohol abuse, gallstones, high (elevated) levels of triglycerides, and certain medicines.  This condition is sometimes treated at a hospital and may involve resting the pancreas, controlling pain, replacing enzymes, and avoiding alcohol. This  information is not intended to replace advice given to you by your health care provider. Make sure you discuss any questions you have with your health care provider. Document Released: 11/20/2015 Document Revised: 08/13/2018 Document Reviewed: 06/23/2017 Elsevier Patient Education  2020 [place].   Pancreatitis Eating Plan Pancreatitis is when your pancreas becomes irritated and swollen (inflamed). The pancreas is a small organ located behind your stomach. It helps your body digest food and regulate your blood sugar. Pancreatitis can affect how your body digests food, especially foods with fat. You may also have other symptoms such as abdominal pain or nausea. When you have pancreatitis, following a low-fat eating plan may help you manage symptoms and recover more quickly. Work with your health care provider or a diet and nutrition specialist (dietitian) to create an eating plan that is right for you. What are tips for following this plan? Reading food labels Use the information on food labels to help keep track of how much fat you eat:  Check the serving size.  Look for the amount of total fat in grams (g) in one serving. ? Low-fat foods have 3 g of fat or less per serving. ? Fat-free foods have 0.5 g of fat or less per serving.  Keep track of how much fat you eat based on how many servings you eat. ? For example, if you eat two servings, the amount of fat you eat will be two times what is listed on the label. Shopping   Buy low-fat or nonfat foods, such as: ? Fresh, frozen, or canned fruits and vegetables. ? Grains, including pasta, bread, and rice. ? Lean meat, poultry, fish, and other protein foods. ? Low-fat or nonfat dairy.  Avoid buying bakery products and other sweets made with whole milk, butter, and eggs.  Avoid buying snack foods with added fat, such as anything with butter or cheese flavoring. Cooking  Remove skin from poultry, and remove extra fat from  meat.  Limit the amount of fat and oil you use to 6 teaspoons or less per day.  Cook using low-fat methods, such as boiling, broiling, grilling, steaming, or baking.  Use spray oil to cook. Add fat-free chicken broth to add flavor and moisture.  Avoid adding cream to thicken soups or sauces. Use other thickeners such as corn  starch or tomato paste. Meal planning   Eat a low-fat diet as told by your dietitian. For most people, this means having no more than 55-65 grams of fat each day.  Eat small, frequent meals throughout the day. For example, you may have 5-6 small meals instead of 3 large meals.  Drink enough fluid to keep your urine pale yellow.  Do not drink alcohol. Talk to your health care provider if you need help stopping.  Limit how much caffeine you have, including black coffee, black and green tea, caffeinated soft drinks, and energy drinks. General information  Let your health care provider or dietitian know if you have unplanned weight loss on this eating plan.  You may be instructed to follow a clear liquid diet during a flare of symptoms. Talk with your health care provider about how to manage your diet during symptoms of a flare.  Take any vitamins or supplements as told by your health care provider.  Work with a Microbiologist, especially if you have other conditions such as obesity or diabetes mellitus. What foods should I avoid? Fruits Fried fruits. Fruits served with butter or cream. Vegetables Fried vegetables. Vegetables cooked with butter, cheese, or cream. Grains Biscuits, waffles, donuts, pastries, and croissants. Pies and cookies. Butter-flavored popcorn. Regular crackers. Meats and other protein foods Fatty cuts of meat. Poultry with skin. Organ meats. Bacon, sausage, and cold cuts. Whole eggs. Nuts and nut butters. Dairy Whole and 2% milk. Whole milk yogurt. Whole milk ice cream. Cream and half-and-half. Cream cheese. Sour cream.  Cheese. Beverages Wine, beer, and liquor. The items listed above may not be a complete list of foods and beverages to avoid. Contact a dietitian for more information. Summary  Pancreatitis can affect how your body digests food, especially foods with fat.  When you have pancreatitis, it is recommended that you follow a low-fat eating plan to help you recover more quickly and manage symptoms. For most people, this means limiting fat to no more than 55-65 grams per day.  Do not drink alcohol. Limit the amount of caffeine you have, and drink enough fluid to keep your urine pale yellow. This information is not intended to replace advice given to you by your health care provider. Make sure you discuss any questions you have with your health care provider. Document Released: 01/30/2018 Document Revised: 02/14/2019 Document Reviewed: 01/30/2018 Elsevier Patient Education  2020 [employer].

## 2019-05-21 ENCOUNTER — Encounter: Payer: Self-pay | Admitting: Internal Medicine

## 2019-05-21 ENCOUNTER — Ambulatory Visit (INDEPENDENT_AMBULATORY_CARE_PROVIDER_SITE_OTHER): Payer: Medicare HMO | Admitting: Internal Medicine

## 2019-05-21 ENCOUNTER — Other Ambulatory Visit: Payer: Self-pay

## 2019-05-21 DIAGNOSIS — M12812 Other specific arthropathies, not elsewhere classified, left shoulder: Secondary | ICD-10-CM

## 2019-05-21 DIAGNOSIS — Z720 Tobacco use: Secondary | ICD-10-CM

## 2019-05-21 DIAGNOSIS — K860 Alcohol-induced chronic pancreatitis: Secondary | ICD-10-CM

## 2019-05-21 DIAGNOSIS — R768 Other specified abnormal immunological findings in serum: Secondary | ICD-10-CM

## 2019-05-21 DIAGNOSIS — K861 Other chronic pancreatitis: Secondary | ICD-10-CM

## 2019-05-21 MED ORDER — PANTOPRAZOLE SODIUM 40 MG PO TBEC: 40.0000 mg | DELAYED_RELEASE_TABLET | Freq: Every day | ORAL | 1 refills | Status: DC

## 2019-05-21 NOTE — Patient Instructions (Signed)
It was our pleasure taking care of you in our clinic today.  You were seen for follow up after recent hospitalization. I am glad that you feel better and your abdominal pain has been improved. I do not make any medication changes. Please drink enough water and advance your diet as tolerate. Continue great job and do not drinking alcohol.  Call your orthopedic surgeon's office and ask about the plan for procedure. Please take your medications as instructed and contact us if you have any question or concern. Follow up with your primary care as needed.  Thanks, Dr. Linna Hoff

## 2019-05-21 NOTE — Assessment & Plan Note (Addendum)
Patient is s/p left shoulder surgery by Dr. Marlou Sa. He reports continuous shoulder pain mostly at left side. On exam: Left shoulder abduction >90' is limited due to pain and he has some tenderness over his left shoulder.  Patient reports that he was supposed to have another shoulder surgery. Per chart review, he was supposed to be seen by Dr. Marlou Sa for an appointment/surgery that canceled since he was hospitalized for acute on chronic pancreatitis.   -Instructed to call his surgeon's office to get update about plan for another visit or surgery

## 2019-05-21 NOTE — Assessment & Plan Note (Signed)
Prior Hep C RNA was negative. No further evaluation or ID referral needed at this point.

## 2019-05-21 NOTE — Progress Notes (Signed)
   CC: Hospital follow up for acute on chronic pancreatitis  HPI:  [name] is a 58 y.o. with PMHx as documented below, presented for hospital follow up of acute on chronic pancreatitis . Please refer to problem based charting for further details and assessment of plan of current problem and chronic medical conditions.    Past Medical History:  Diagnosis Date  . Arthritis   . GERD (gastroesophageal reflux disease)   . Pancreatitis   . Tuberculosis    yrs. ago pt. states he was dx. here at [facility] Hx: Smokes 2 cigarettes per day and smokes marijuana occasionally  Last alcohol drink was around 10 days ago. Prior to that, drank 3 beers a day Lives alone. Resides in [place]  FHx: Mother with HTN  Father HTN  Review of Systems:   Review of Systems  Constitutional: Negative for chills and fever.  Gastrointestinal: Positive for nausea. Negative for blood in stool, constipation, diarrhea and vomiting.  Genitourinary: Negative for dysuria, frequency and hematuria.  Neurological: Negative for dizziness and headaches.    Physical Exam:  Vitals:   05/21/19 1048  BP: 137/82  Pulse: 100  Temp: 98.8 F (37.1 C)  TempSrc: Oral  SpO2: 100%  Weight: 109 lb 6.4 oz (49.6 kg)  Height: 5\' 7"  (1.702 m)    Constitutional: Appears well-developed and well-nourished. No distress.  HENT:  Head: Normocephalic and atraumatic.  Eyes: Conjunctivae are normal.  Cardiovascular: Normal rate, regular rhythm and normal heart sounds.  Respiratory: Effort normal and breath sounds normal. No respiratory distress. No wheezes.  GI: Soft. Bowel sounds are normal. No distension. There is no tenderness.  Musculoskeletal: Surgical scar at left shoulder .  Neurological: Is alert.  Skin: Not diaphoretic. No erythema.  Psychiatric: Slightly anxious and irritable  Assessment & Plan:   See Encounters Tab for problem based charting.  Patient discussed with Dr. Angelia Mould

## 2019-05-21 NOTE — Assessment & Plan Note (Addendum)
Patient presented for follow up of recent hospitalization for acute on chronic pancreatitis. He is doing much better. He still reports some nausea but no vomiting and  no major abdominal pain. She tries to eat soft food and can tolerate that well. Has soft bowel movement.  -Encouraged to continue avoiding alcohol -Encourage to keep hims selft hydrated with water -Advance diet as tolerate

## 2019-05-23 NOTE — Progress Notes (Signed)
Internal Medicine Clinic Attending  Case discussed with Dr. Masoudi  at the time of the visit.  We reviewed the resident's history and exam and pertinent patient test results.  I agree with the assessment, diagnosis, and plan of care documented in the resident's note.  

## 2019-05-24 ENCOUNTER — Telehealth: Payer: Self-pay | Admitting: Internal Medicine

## 2019-05-24 DIAGNOSIS — M19019 Primary osteoarthritis, unspecified shoulder: Secondary | ICD-10-CM

## 2019-05-24 MED ORDER — DICLOFENAC SODIUM 1 % TD GEL: 2.0000 g | Freq: Four times a day (QID) | TRANSDERMAL | 0 refills | Status: DC

## 2019-05-24 MED ORDER — MELOXICAM 7.5 MG PO TABS: 7.5000 mg | ORAL_TABLET | Freq: Every day | ORAL | 0 refills | Status: AC

## 2019-05-24 NOTE — Telephone Encounter (Signed)
   Reason for call:   I received a call from Mr. KAGEN KUNATH at 3:30 PM but conversation was interrupted by an emergency in the hospital and patient was called back at 5:50 pm. He indicated ongoing shoulder pain and abdominal pain.   Pertinent Data:   Abdominal pain has been chronic and stable and is due to his ongoing chronic pancreatitis, it waxes and wanes from a 2-4/10 and is unchanged from recent clinic visit  His shoulder pain is chronic as well and in in his bilateral shoulders. He has had an operation in his L shoulder and has another one planned for his right shoulder. He is requesting stronger pain medication.   Assessment / Plan / Recommendations:   Chronic shoulder pain - He is to call his orthopedist for appointment as planned - Will prescribe Mobic 7.5mg  Daily x 1 week then PRN - Voltaren gel PRN - Patient to call for in-person appointment for re-evaluation if this pain medication is ineffective   As always, pt is advised that if symptoms worsen or new symptoms arise, they should go to an urgent care facility or to to ER for further evaluation.   Neva Seat, MD   05/24/2019, 3:31 PM

## 2019-05-29 ENCOUNTER — Inpatient Hospital Stay: Payer: Non-veteran care | Admitting: Orthopedic Surgery

## 2019-06-03 ENCOUNTER — Telehealth: Payer: Self-pay | Admitting: Orthopedic Surgery

## 2019-06-03 DIAGNOSIS — M19011 Primary osteoarthritis, right shoulder: Secondary | ICD-10-CM

## 2019-06-03 DIAGNOSIS — M12812 Other specific arthropathies, not elsewhere classified, left shoulder: Secondary | ICD-10-CM

## 2019-06-03 DIAGNOSIS — M19012 Primary osteoarthritis, left shoulder: Secondary | ICD-10-CM

## 2019-06-03 NOTE — Telephone Encounter (Signed)
I called patient to let him know medical clearance for his right reverse shoulder arthroplasty would be needed prior to scheduling. I spoke with May at [facility]. Patient was seen by Dr. Neva Seat 05-24-19, however Dr. Earlene Plater is indicated in Epic as his PCP.  Clearance request was faxed to [phone].  Also a new scan is necessary per [employer] since the blocks have expired that were done in 2019.  Please provide referral to [facility] for a new scan.  I spoke with Taylor at the [facility] in [place], [place]. Maylon Cos says the referral includes all diagnostics and will be covered.  Patient understands the need for medical clearance and a new scan.  He states he is in pain and would like a prescription until he can be scheduled.

## 2019-06-03 NOTE — Telephone Encounter (Signed)
Can you please submit electronically.  

## 2019-06-03 NOTE — Telephone Encounter (Signed)
Ok for t 3 1 0o q 8 # 30 pls clal thx

## 2019-06-03 NOTE — Telephone Encounter (Signed)
Please advise. Thanks.  

## 2019-06-04 ENCOUNTER — Other Ambulatory Visit: Payer: Self-pay | Admitting: Surgical

## 2019-06-04 MED ORDER — ACETAMINOPHEN-CODEINE #3 300-30 MG PO TABS: 1.0000 | ORAL_TABLET | Freq: Three times a day (TID) | ORAL | 0 refills | Status: DC | PRN

## 2019-06-04 NOTE — Telephone Encounter (Signed)
Stephen [name], I ordered new thin cut CT scan.

## 2019-06-17 ENCOUNTER — Ambulatory Visit
Admission: RE | Admit: 2019-06-17 | Discharge: 2019-06-17 | Disposition: A | Discharge status: Home / Self Care | Payer: Medicare HMO | Source: Ambulatory Visit | Attending: Orthopedic Surgery | Admitting: Orthopedic Surgery

## 2019-06-17 ENCOUNTER — Other Ambulatory Visit: Payer: Self-pay

## 2019-06-17 DIAGNOSIS — M19012 Primary osteoarthritis, left shoulder: Secondary | ICD-10-CM

## 2019-06-17 DIAGNOSIS — M19011 Primary osteoarthritis, right shoulder: Secondary | ICD-10-CM

## 2019-06-19 ENCOUNTER — Telehealth: Payer: Self-pay | Admitting: Orthopedic Surgery

## 2019-06-19 NOTE — Telephone Encounter (Signed)
Returned call to [place] with [facility] in [place]  Left message to call  Back

## 2019-07-22 ENCOUNTER — Ambulatory Visit (INDEPENDENT_AMBULATORY_CARE_PROVIDER_SITE_OTHER): Payer: Medicare HMO | Admitting: Internal Medicine

## 2019-07-22 ENCOUNTER — Encounter: Payer: Self-pay | Admitting: Internal Medicine

## 2019-07-22 ENCOUNTER — Other Ambulatory Visit: Payer: Self-pay

## 2019-07-22 VITALS — BP 141/88 | HR 105 | Temp 98.9°F | Wt 109.6 lb

## 2019-07-22 DIAGNOSIS — G8929 Other chronic pain: Secondary | ICD-10-CM | POA: Diagnosis not present

## 2019-07-22 DIAGNOSIS — M19012 Primary osteoarthritis, left shoulder: Secondary | ICD-10-CM

## 2019-07-22 DIAGNOSIS — Z01818 Encounter for other preprocedural examination: Secondary | ICD-10-CM | POA: Diagnosis not present

## 2019-07-22 DIAGNOSIS — M19011 Primary osteoarthritis, right shoulder: Secondary | ICD-10-CM

## 2019-07-22 DIAGNOSIS — M19019 Primary osteoarthritis, unspecified shoulder: Secondary | ICD-10-CM

## 2019-07-22 DIAGNOSIS — M25511 Pain in right shoulder: Secondary | ICD-10-CM

## 2019-07-22 DIAGNOSIS — Z79899 Other long term (current) drug therapy: Secondary | ICD-10-CM

## 2019-07-22 DIAGNOSIS — Z79891 Long term (current) use of opiate analgesic: Secondary | ICD-10-CM

## 2019-07-22 MED ORDER — MELOXICAM 7.5 MG PO TABS: 7.5000 mg | ORAL_TABLET | Freq: Every day | ORAL | 2 refills | Status: DC

## 2019-07-22 MED ORDER — DICLOFENAC SODIUM 1 % TD GEL: 2.0000 g | Freq: Four times a day (QID) | TRANSDERMAL | 0 refills | Status: DC

## 2019-07-22 MED ORDER — METHOCARBAMOL 500 MG PO TABS: 500.0000 mg | ORAL_TABLET | Freq: Three times a day (TID) | ORAL | 1 refills | Status: DC | PRN

## 2019-07-22 MED ORDER — PANTOPRAZOLE SODIUM 40 MG PO TBEC: 40.0000 mg | DELAYED_RELEASE_TABLET | Freq: Every day | ORAL | 1 refills | Status: DC

## 2019-07-22 MED ORDER — OXYCODONE HCL 5 MG PO TABS: 5.0000 mg | ORAL_TABLET | Freq: Two times a day (BID) | ORAL | 0 refills | Status: AC | PRN

## 2019-07-22 NOTE — Assessment & Plan Note (Signed)
Patient has been seen by [facility] and had left reverse shoulder arthroplasty performed in February 2020.  Patient tolerated the procedure well.  They have planned to do the right shoulder this time around and needed surgical clearance.  Patient has a cardiac MET score >4 and RCRI score of 0. No further cardiac work-up is indicated at this time. - Patient cleared for surgery 

## 2019-07-22 NOTE — Patient Instructions (Addendum)
Thank you for visiting [place] in clinic today.  Below is a summary of what we discussed:  1.  Surgical clearance -You are medically ready for surgery.  I will send the paperwork over to your surgeons.  If you do not hear from them within the next 1 to 2 weeks call us and we will help you get in touch with them.  2.  Medication refills - Rub the Voltaren gel on sore spots as needed - Only take oxycodone 5 mg once every 12 hours as needed for pain

## 2019-07-22 NOTE — Progress Notes (Signed)
   CC: Preoperative clearance  HPI:  [name] is a 58 y.o. m with a PMHx as noted below who presents today to get clearance for surgery. The patient is seen by [facility] and has been deemed a candidate for R reverse shoulder arthroplasty. The patient had the L shoulder done with Dr. Marlou Sa in February of this year.  Pt was last seen in July for hosptial follow up for acute on chronic pancreatitis. Since that time, the patient has not had abdominal pain or difficulties with his diet. He denies any recent fevers, HA, chest pain, SOB, or changes in his bowel or bladder habits.   The patient says he needs refills on some of his medications: oxycodone, meloxicam, methocarbamol, pantoprazole.   Past Medical History:  Diagnosis Date  . Arthritis   . GERD (gastroesophageal reflux disease)   . Pancreatitis   . Tuberculosis    yrs. ago pt. states he was dx. here at [facility]   Review of Systems:  All systems have been reviewed and are otherwise negative unless mentioned in the HPI.  Physical Exam:  There were no vitals filed for this visit.  Physical Exam Vitals signs reviewed.  Constitutional:      General: He is not in acute distress.    Appearance: Normal appearance. He is not ill-appearing, toxic-appearing or diaphoretic.  HENT:     Head: Normocephalic and atraumatic.  Eyes:     General:        Right eye: No discharge.        Left eye: No discharge.     Extraocular Movements: Extraocular movements intact.  Cardiovascular:     Rate and Rhythm: Normal rate and regular rhythm.     Pulses: Normal pulses.     Heart sounds: Normal heart sounds. No murmur. No friction rub. No gallop.   Pulmonary:     Effort: Pulmonary effort is normal. No respiratory distress.     Breath sounds: Normal breath sounds. No wheezing or rales.  Abdominal:     General: Abdomen is flat. Bowel sounds are normal. There is no distension.     Palpations: Abdomen is soft.     Tenderness: There is  no abdominal tenderness. There is no guarding.  Musculoskeletal:        General: Tenderness (R shoulder) present.     Right lower leg: No edema.     Left lower leg: No edema.     Comments: Decreased ROM of R shoulder due to pain  Skin:    General: Skin is warm.  Neurological:     General: No focal deficit present.     Mental Status: He is alert and oriented to person, place, and time.     Cranial Nerves: No cranial nerve deficit.     Comments: No focal deficits on exam  Psychiatric:        Mood and Affect: Mood normal.    Assessment & Plan:   See Encounters Tab for problem based charting.  Patient seen with Dr. Angelia Mould

## 2019-07-22 NOTE — Assessment & Plan Note (Signed)
Shoulder x-rays in the past have shown severe osteoarthritis of bilateral shoulders.  Patient is requesting refills of medications today. - Provide oxycodone IR 5 mg twice daily PRN for 14 days - Meloxicam 7.5 mg daily -Methocarbamol 500 mg every 8 hours as needed - Diclofenac gel PRN

## 2019-08-07 NOTE — Progress Notes (Signed)
[id number] [address], [place] [address] Phone: [phone] Fax: [phone]      Your procedure is scheduled on Thursday 08/15/2019.  Report to [facility] Main Entrance "A" at 08:45 A.M., and check in at the Admitting office.  Call this number if you have problems the morning of surgery:  [phone]  Call [phone] if you have any questions prior to your surgery date Monday-Friday 8am-4pm    Remember:  Do not eat after midnight the night before your surgery  You may drink clear liquids until 07:45am the morning of your surgery.   Clear liquids allowed are: Water, Non-Citrus Juices (without pulp), Carbonated Beverages, Clear Tea, Black Coffee Only, and Gatorade   Enhanced Recovery after Surgery for Orthopedics Enhanced Recovery after Surgery is a protocol used to improve the stress on your body and your recovery after surgery.  Patient Instructions  . The night before surgery:  o No food after midnight. ONLY clear liquids after midnight  .  Marland Kitchen The day of surgery (if you do NOT have diabetes):  o Drink ONE (1) Pre-Surgery Clear Ensure as directed.   o This drink was given to you during your hospital  pre-op appointment visit. o The pre-op nurse will instruct you on the time to drink the  Pre-Surgery Ensure depending on your surgery time. o Finish the drink at the designated time by the pre-op nurse.  o Nothing else to drink after completing the  Pre-Surgery Clear Ensure.         If you have questions, please contact your surgeon's office.     Take these medicines the morning of surgery with A SIP OF WATER: Acetaminophen-codeine (Tylenol #3) - if needed Methocarbamol (Robaxin) - if needed Pantoprazole (Protonix)   7 days prior to surgery STOP taking any Diclofenac sodium (Voltaren) gel; Meloxicam (Mobic); Aspirin (unless otherwise instructed by your surgeon), Aleve, Naproxen, Ibuprofen, Motrin,  Advil, Goody's, BC's, all herbal medications, fish oil, and all vitamins.    The Morning of Surgery  Do not wear jewelry.  Do not wear lotions, powders, colognes, or deodorant  Men may shave face and neck.  Do not bring valuables to the hospital.  [facility] is not responsible for any belongings or valuables.  If you are a smoker, DO NOT Smoke 24 hours prior to surgery  If you wear a CPAP at night please bring your mask, tubing, and machine the morning of surgery   Remember that you must have someone to transport you home after your surgery, and remain with you for 24 hours if you are discharged the same day.   Contacts, eyeglasses, hearing aids, dentures or bridgework may not be worn into surgery.    Leave your suitcase in the car.  After surgery it may be brought to your room.  For patients admitted to the hospital, discharge time will be determined by your treatment team.  Patients discharged the day of surgery will not be allowed to drive home.    Special instructions:   [place]- Preparing For Surgery  Before surgery, you can play an important role. Because skin is not sterile, your skin needs to be as free of germs as possible. You can reduce the number of germs on your skin by washing with CHG (chlorahexidine gluconate) Soap before surgery.  CHG is an antiseptic cleaner which kills germs and bonds with the skin to continue killing germs even after washing.  Oral Hygiene is also important to reduce your risk of infection.  Remember - BRUSH YOUR TEETH THE MORNING OF SURGERY WITH YOUR REGULAR TOOTHPASTE  Please do not use if you have an allergy to CHG or antibacterial soaps. If your skin becomes reddened/irritated stop using the CHG.  Do not shave (including legs and underarms) for at least 48 hours prior to first CHG shower. It is OK to shave your face.  Please follow these instructions carefully.   1. Shower the NIGHT BEFORE SURGERY and the MORNING OF SURGERY with  CHG Soap.   2. If you chose to wash your hair, wash your hair first as usual with your normal shampoo.  3. After you shampoo, rinse your hair and body thoroughly to remove the shampoo.  4. Use CHG as you would any other liquid soap. You can apply CHG directly to the skin and wash gently with a scrungie or a clean washcloth.   5. Apply the CHG Soap to your body ONLY FROM THE NECK DOWN.  Do not use on open wounds or open sores. Avoid contact with your eyes, ears, mouth and genitals (private parts). Wash Face and genitals (private parts)  with your normal soap.   6. Wash thoroughly, paying special attention to the area where your surgery will be performed.  7. Thoroughly rinse your body with warm water from the neck down.  8. DO NOT shower/wash with your normal soap after using and rinsing off the CHG Soap.  9. Pat yourself dry with a CLEAN TOWEL.  10. Wear CLEAN PAJAMAS to bed the night before surgery, wear comfortable clothes the morning of surgery  11. Place CLEAN SHEETS on your bed the night of your first shower and DO NOT SLEEP WITH PETS.    Day of Surgery:   Please shower the morning of surgery with the CHG soap Do not apply any deodorants/lotions.  Please wear clean clothes to the hospital/surgery center.   Remember to brush your teeth WITH YOUR REGULAR TOOTHPASTE.   Please read over the following fact sheets that you were given.

## 2019-08-08 ENCOUNTER — Encounter (HOSPITAL_COMMUNITY): Payer: Self-pay

## 2019-08-08 ENCOUNTER — Other Ambulatory Visit: Payer: Self-pay

## 2019-08-08 ENCOUNTER — Encounter (HOSPITAL_COMMUNITY)
Admission: RE | Admit: 2019-08-08 | Discharge: 2019-08-08 | Disposition: A | Discharge status: Home / Self Care | Payer: Medicare HMO | Source: Ambulatory Visit | Attending: Orthopedic Surgery | Admitting: Orthopedic Surgery

## 2019-08-08 DIAGNOSIS — M12811 Other specific arthropathies, not elsewhere classified, right shoulder: Secondary | ICD-10-CM | POA: Insufficient documentation

## 2019-08-08 DIAGNOSIS — Z01812 Encounter for preprocedural laboratory examination: Secondary | ICD-10-CM | POA: Diagnosis not present

## 2019-08-08 DIAGNOSIS — F17210 Nicotine dependence, cigarettes, uncomplicated: Secondary | ICD-10-CM | POA: Diagnosis not present

## 2019-08-08 DIAGNOSIS — K219 Gastro-esophageal reflux disease without esophagitis: Secondary | ICD-10-CM | POA: Insufficient documentation

## 2019-08-08 LAB — CBC
HCT: 47.6 % (ref 39.0–52.0)
Hemoglobin: 15.2 g/dL (ref 13.0–17.0)
MCH: 28.7 pg (ref 26.0–34.0)
MCHC: 31.9 g/dL (ref 30.0–36.0)
MCV: 90 fL (ref 80.0–100.0)
Platelets: 353 10*3/uL (ref 150–400)
RBC: 5.29 MIL/uL (ref 4.22–5.81)
RDW: 13.8 % (ref 11.5–15.5)
WBC: 6.9 10*3/uL (ref 4.0–10.5)
nRBC: 0 % (ref 0.0–0.2)

## 2019-08-08 LAB — URINALYSIS, ROUTINE W REFLEX MICROSCOPIC
Bacteria, UA: NONE SEEN
Bilirubin Urine: NEGATIVE
Glucose, UA: NEGATIVE mg/dL
Ketones, ur: NEGATIVE mg/dL
Leukocytes,Ua: NEGATIVE
Nitrite: NEGATIVE
Protein, ur: NEGATIVE mg/dL
Specific Gravity, Urine: 1.004 — ABNORMAL LOW (ref 1.005–1.030)
pH: 7 (ref 5.0–8.0)

## 2019-08-08 LAB — COMPREHENSIVE METABOLIC PANEL
ALT: 51 U/L — ABNORMAL HIGH (ref 0–44)
AST: 74 U/L — ABNORMAL HIGH (ref 15–41)
Albumin: 4.1 g/dL (ref 3.5–5.0)
Alkaline Phosphatase: 133 U/L — ABNORMAL HIGH (ref 38–126)
Anion gap: 9 (ref 5–15)
BUN: 6 mg/dL (ref 6–20)
CO2: 27 mmol/L (ref 22–32)
Calcium: 9.5 mg/dL (ref 8.9–10.3)
Chloride: 103 mmol/L (ref 98–111)
Creatinine, Ser: 1.11 mg/dL (ref 0.61–1.24)
GFR calc Af Amer: >60 mL/min (ref >60)
GFR calc non Af Amer: >60 mL/min (ref >60)
Glucose, Bld: 117 mg/dL — ABNORMAL HIGH (ref 70–99)
Potassium: 4.2 mmol/L (ref 3.5–5.1)
Sodium: 139 mmol/L (ref 135–145)
Total Bilirubin: 0.8 mg/dL (ref 0.3–1.2)
Total Protein: 7.9 g/dL (ref 6.5–8.1)

## 2019-08-08 LAB — SURGICAL PCR SCREEN
MRSA, PCR: NEGATIVE
Staphylococcus aureus: NEGATIVE

## 2019-08-08 NOTE — Progress Notes (Signed)
PCP - Earlene Plater, MD Cardiologist - N/A   PPM/ICD - N/A  Device Orders -  N/A  Rep Notified N/A   Chest x-ray - N/A  EKG - 05/12/2019 Stress Test - N/A  ECHO - N/A  Cardiac Cath -  N/A   Sleep Study - Denies CPAP - N/A   Fasting Blood Sugar - N/A  Checks Blood Sugar ___N/A __ times a day  Blood Thinner Instructions: N/A  Aspirin Instructions: N/A   ERAS Protcol - Yes PRE-SURGERY Ensure - Yes  COVID TEST- 08/12/2019   Anesthesia review: No  Patient denies shortness of breath, fever, cough and chest pain at PAT appointment   Coronavirus Screening  Have you experienced the following symptoms:  Cough yes/no: No Fever (>100.19F)  yes/no: No Runny nose yes/no: No Sore throat yes/no: No Difficulty breathing/shortness of breath  yes/no: No  Have you or a family member traveled in the last 14 days and where? yes/no: No   If the patient indicates "YES" to the above questions, their PAT will be rescheduled to limit the exposure to others and, the surgeon will be notified. THE PATIENT WILL NEED TO BE ASYMPTOMATIC FOR 14 DAYS.   If the patient is not experiencing any of these symptoms, the PAT nurse will instruct them to NOT bring anyone with them to their appointment since they may have these symptoms or traveled as well.   Please remind your patients and families that hospital visitation restrictions are in effect and the importance of the restrictions.     Patient verbalized understanding of instructions that were given to them at the PAT appointment. Patient was also instructed that they will need to review over the PAT instructions again at home before surgery.

## 2019-08-09 LAB — URINE CULTURE: Culture: NO GROWTH

## 2019-08-12 ENCOUNTER — Other Ambulatory Visit: Payer: Self-pay

## 2019-08-12 ENCOUNTER — Other Ambulatory Visit (HOSPITAL_COMMUNITY)
Admission: RE | Admit: 2019-08-12 | Discharge: 2019-08-12 | Disposition: A | Discharge status: Home / Self Care | Payer: Medicare HMO | Source: Ambulatory Visit | Attending: Orthopedic Surgery | Admitting: Orthopedic Surgery

## 2019-08-12 DIAGNOSIS — Z01812 Encounter for preprocedural laboratory examination: Secondary | ICD-10-CM | POA: Insufficient documentation

## 2019-08-12 DIAGNOSIS — Z20828 Contact with and (suspected) exposure to other viral communicable diseases: Secondary | ICD-10-CM | POA: Insufficient documentation

## 2019-08-13 LAB — NOVEL CORONAVIRUS, NAA (HOSP ORDER, SEND-OUT TO REF LAB; TAT 18-24 HRS): SARS-CoV-2, NAA: NOT DETECTED

## 2019-08-15 ENCOUNTER — Other Ambulatory Visit: Payer: Self-pay

## 2019-08-15 ENCOUNTER — Encounter (HOSPITAL_COMMUNITY): Payer: Self-pay

## 2019-08-15 ENCOUNTER — Ambulatory Visit (HOSPITAL_COMMUNITY)
Admission: RE | Admit: 2019-08-15 | Discharge: 2019-08-15 | Disposition: A | Discharge status: Home / Self Care | Payer: No Typology Code available for payment source | Attending: Orthopedic Surgery | Admitting: Orthopedic Surgery

## 2019-08-15 ENCOUNTER — Encounter (HOSPITAL_COMMUNITY): Payer: Self-pay | Admitting: Anesthesiology

## 2019-08-15 ENCOUNTER — Encounter (HOSPITAL_COMMUNITY)
Admission: RE | Disposition: A | Discharge status: Home / Self Care | Payer: Self-pay | Source: Home / Self Care | Attending: Orthopedic Surgery

## 2019-08-15 DIAGNOSIS — M19011 Primary osteoarthritis, right shoulder: Secondary | ICD-10-CM | POA: Diagnosis not present

## 2019-08-15 SURGERY — ARTHROPLASTY, SHOULDER, TOTAL, REVERSE
Anesthesia: General

## 2019-08-15 MED ORDER — CHLORHEXIDINE GLUCONATE 4 % EX LIQD: 60.0000 mL | Freq: Once | CUTANEOUS | Status: DC

## 2019-08-15 MED ORDER — CEFAZOLIN SODIUM-DEXTROSE 2-4 GM/100ML-% IV SOLN
2.0000 g | INTRAVENOUS | Status: DC
  Filled 2019-08-15: qty 100

## 2019-08-15 MED ORDER — SCOPOLAMINE 1 MG/3DAYS TD PT72
1.0000 | MEDICATED_PATCH | TRANSDERMAL | Status: DC
  Filled 2019-08-15: qty 1

## 2019-08-15 MED ORDER — MIDAZOLAM HCL 2 MG/2ML IJ SOLN
INTRAMUSCULAR | Status: AC
  Filled 2019-08-15: qty 2

## 2019-08-15 MED ORDER — PROPOFOL 10 MG/ML IV BOLUS
INTRAVENOUS | Status: AC
  Filled 2019-08-15: qty 20

## 2019-08-15 MED ORDER — CELECOXIB 200 MG PO CAPS
400.0000 mg | ORAL_CAPSULE | Freq: Once | ORAL | Status: DC
  Filled 2019-08-15: qty 2

## 2019-08-15 MED ORDER — FENTANYL CITRATE (PF) 100 MCG/2ML IJ SOLN
INTRAMUSCULAR | Status: AC
  Filled 2019-08-15: qty 2

## 2019-08-15 MED ORDER — LACTATED RINGERS IV SOLN
INTRAVENOUS | Status: DC
  Administered 2019-08-15: 09:00:00 via INTRAVENOUS

## 2019-08-15 MED ORDER — ACETAMINOPHEN 500 MG PO TABS
1000.0000 mg | ORAL_TABLET | Freq: Once | ORAL | Status: DC
  Filled 2019-08-15: qty 2

## 2019-08-15 MED ORDER — FENTANYL CITRATE (PF) 250 MCG/5ML IJ SOLN
INTRAMUSCULAR | Status: AC
  Filled 2019-08-15: qty 5

## 2019-08-15 MED ORDER — TRANEXAMIC ACID-NACL 1000-0.7 MG/100ML-% IV SOLN
1000.0000 mg | INTRAVENOUS | Status: DC
  Filled 2019-08-15 (×2): qty 100

## 2019-08-15 NOTE — Anesthesia Preprocedure Evaluation (Deleted)
Anesthesia Evaluation    Reviewed: Allergy & Precautions, Patient's Chart, lab work & pertinent test results  History of Anesthesia Complications Negative for: history of anesthetic complications  Airway        Dental   Pulmonary Current Smoker and Patient abstained from smoking.,           Cardiovascular Exercise Tolerance: Good negative cardio ROS       Neuro/Psych negative neurological ROS  negative psych ROS   GI/Hepatic GERD  ,(+)     substance abuse  alcohol use and marijuana use,   Endo/Other  negative endocrine ROS  Renal/GU negative Renal ROS     Musculoskeletal  (+) Arthritis ,   Abdominal   Peds  Hematology negative hematology ROS (+)   Anesthesia Other Findings   Reproductive/Obstetrics                             Anesthesia Physical  Anesthesia Plan  ASA: III  Anesthesia Plan: General   Post-op Pain Management:  Regional for Post-op pain   Induction: Intravenous  PONV Risk Score and Plan: 3 and Treatment may vary due to age or medical condition, Ondansetron, Dexamethasone and Midazolam  Airway Management Planned: Oral ETT  Additional Equipment: None  Intra-op Plan:   Post-operative Plan: Extubation in OR  Informed Consent:   Plan Discussed with: Anesthesiologist  Anesthesia Plan Comments: (Pt ate full breakfast at 7:30, case cancelled by surgeon.)       Anesthesia Quick Evaluation

## 2019-08-15 NOTE — Progress Notes (Signed)
Called Dr. Tobias Alexander to make aware patient ate grits and eggs at 0730.  Dr. Tobias Alexander and Dr. Marlou Sa spoke and case has been cancelled.  IV dc'd.  Clean, dry and intact.  Patient to call office to reschedule.

## 2019-08-26 NOTE — Progress Notes (Signed)
Internal Medicine Clinic Attending ° °I saw and evaluated the patient.  I personally confirmed the key portions of the history and exam documented by Dr. Alexander and I reviewed pertinent patient test results.  The assessment, diagnosis, and plan were formulated together and I agree with the documentation in the resident’s note.  °

## 2019-08-27 NOTE — Pre-Procedure Instructions (Signed)
[id number] [address], [place] [address] Phone: [phone] Fax: [phone]                 Your procedure is scheduled on Tuesday,  October 28th.             Report to [facility] Main Entrance "A" at 5:30 A.M., and check in at the Admitting office.             Call this number if you have problems the morning of surgery:             [phone]  Call [phone] if you have any questions prior to your surgery date Monday-Friday 8am-4pm               Remember:             Do not eat after midnight the night before your surgery  You may drink clear liquids until 4:30am the morning of your surgery.   Clear liquids allowed are: Water, Non-Citrus Juices (without pulp), Carbonated Beverages, Clear Tea, Black Coffee Only, and Gatorade  Please complete your PRE-SURGERY ENSURE that was provided to you by 4:30a.m. the morning of surgery.  Please, if able, drink it in one setting. DO NOT SIP.  Enhanced Recovery after Surgery for Orthopedics Enhanced Recovery after Surgery is a protocol used to improve the stress on your body and your recovery after surgery.  Patient Instructions   The night before surgery:  ? No food after midnight. ONLY clear liquids after midnight     The day of surgery (if you do NOT have diabetes):  ? Drink ONE (1) Pre-Surgery Clear Ensure as directed.   ? This drink was given to you during your hospital  pre-op appointment visit. ? The pre-op nurse will instruct you on the time to drink the  Pre-Surgery Ensure depending on your surgery time. ? Finish the drink at the designated time by the pre-op nurse.  ? Nothing else to drink after completing the  Pre-Surgery Clear Ensure.         If you have questions, please contact your surgeon's office.                           Take these medicines the morning of surgery with A SIP OF WATER: Methocarbamol (Robaxin) - if needed  Pantoprazole (Protonix)  Follow your surgeon's instructions on when to stop Aspirin.  If no instructions were given by your surgeon then you will need to call the office to get those instructions.    As of today, STOP taking any Meloxicam (Mobic); Aspirin (unless otherwise instructed by your surgeon), Aleve, Naproxen, Ibuprofen, Motrin, Advil, Goody's, BC's, all herbal medications, fish oil, and all vitamins.               The Morning of Surgery             Do not wear jewelry.             Do not wear lotions, powders, colognes, or deodorant             Men may shave face and neck.             Do not bring valuables to the hospital.             [facility] is not responsible for any belongings or valuables.  If  you are a smoker, DO NOT Smoke 24 hours prior to surgery  If you wear a CPAP at night please bring your mask, tubing, and machine the morning of surgery   Remember that you must have someone to transport you home after your surgery, and remain with you for 24 hours if you are discharged the same day.   Contacts, eyeglasses, hearing aids, dentures or bridgework may not be worn into surgery.    Leave your suitcase in the car.  After surgery it may be brought to your room.  For patients admitted to the hospital, discharge time will be determined by your treatment team.  Patients discharged the day of surgery will not be allowed to drive home.    Special instructions:   [place]- Preparing For Surgery  Before surgery, you can play an important role. Because skin is not sterile, your skin needs to be as free of germs as possible. You can reduce the number of germs on your skin by washing with CHG (chlorahexidine gluconate) Soap before surgery.  CHG is an antiseptic cleaner which kills germs and bonds with the skin to continue killing germs even after washing.    Oral Hygiene is also important to reduce your risk of infection.  Remember - BRUSH YOUR TEETH THE  MORNING OF SURGERY WITH YOUR REGULAR TOOTHPASTE  Please do not use if you have an allergy to CHG or antibacterial soaps. If your skin becomes reddened/irritated stop using the CHG.  Do not shave (including legs and underarms) for at least 48 hours prior to first CHG shower. It is OK to shave your face.  Please follow these instructions carefully.                                                                                                                               1. Shower the NIGHT BEFORE SURGERY and the MORNING OF SURGERY with CHG Soap.   2. If you chose to wash your hair, wash your hair first as usual with your normal shampoo.  3. After you shampoo, rinse your hair and body thoroughly to remove the shampoo.  4. Use CHG as you would any other liquid soap. You can apply CHG directly to the skin and wash gently with a scrungie or a clean washcloth.   5. Apply the CHG Soap to your body ONLY FROM THE NECK DOWN.  Do not use on open wounds or open sores. Avoid contact with your eyes, ears, mouth and genitals (private parts). Wash Face and genitals (private parts)  with your normal soap.   6. Wash thoroughly, paying special attention to the area where your surgery will be performed.  7. Thoroughly rinse your body with warm water from the neck down.  8. DO NOT shower/wash with your normal soap after using and rinsing off the CHG Soap.  9. Pat yourself dry with a CLEAN TOWEL.  10. Wear CLEAN PAJAMAS to bed the  night before surgery, wear comfortable clothes the morning of surgery  11. Place CLEAN SHEETS on your bed the night of your first shower and DO NOT SLEEP WITH PETS.    Day of Surgery:              Please shower the morning of surgery with the CHG soap Do not apply any deodorants/lotions.  Please wear clean clothes to the hospital/surgery center.   Remember to brush your teeth WITH YOUR REGULAR TOOTHPASTE.   Please read over the following fact sheets that  you were given.

## 2019-08-28 ENCOUNTER — Encounter (HOSPITAL_COMMUNITY): Payer: Self-pay

## 2019-08-28 ENCOUNTER — Other Ambulatory Visit: Payer: Self-pay

## 2019-08-28 ENCOUNTER — Encounter (HOSPITAL_COMMUNITY)
Admission: RE | Admit: 2019-08-28 | Discharge: 2019-08-28 | Disposition: A | Discharge status: Home / Self Care | Payer: No Typology Code available for payment source | Source: Ambulatory Visit | Attending: Orthopedic Surgery | Admitting: Orthopedic Surgery

## 2019-08-28 ENCOUNTER — Inpatient Hospital Stay: Payer: No Typology Code available for payment source | Admitting: Orthopedic Surgery

## 2019-08-28 DIAGNOSIS — Z01812 Encounter for preprocedural laboratory examination: Secondary | ICD-10-CM | POA: Insufficient documentation

## 2019-08-28 LAB — CBC
HCT: 45.2 % (ref 39.0–52.0)
Hemoglobin: 14.5 g/dL (ref 13.0–17.0)
MCH: 28.9 pg (ref 26.0–34.0)
MCHC: 32.1 g/dL (ref 30.0–36.0)
MCV: 90 fL (ref 80.0–100.0)
Platelets: 362 10*3/uL (ref 150–400)
RBC: 5.02 MIL/uL (ref 4.22–5.81)
RDW: 13.9 % (ref 11.5–15.5)
WBC: 11.6 10*3/uL — ABNORMAL HIGH (ref 4.0–10.5)
nRBC: 0 % (ref 0.0–0.2)

## 2019-08-28 LAB — BASIC METABOLIC PANEL
Anion gap: 7 (ref 5–15)
BUN: 7 mg/dL (ref 6–20)
CO2: 25 mmol/L (ref 22–32)
Calcium: 9.6 mg/dL (ref 8.9–10.3)
Chloride: 103 mmol/L (ref 98–111)
Creatinine, Ser: 0.93 mg/dL (ref 0.61–1.24)
GFR calc Af Amer: >60 mL/min (ref >60)
GFR calc non Af Amer: >60 mL/min (ref >60)
Glucose, Bld: 152 mg/dL — ABNORMAL HIGH (ref 70–99)
Potassium: 3.8 mmol/L (ref 3.5–5.1)
Sodium: 135 mmol/L (ref 135–145)

## 2019-08-28 LAB — URINALYSIS, ROUTINE W REFLEX MICROSCOPIC
Glucose, UA: NEGATIVE mg/dL
Hgb urine dipstick: NEGATIVE
Ketones, ur: 5 mg/dL — AB
Leukocytes,Ua: NEGATIVE
Nitrite: NEGATIVE
Protein, ur: 30 mg/dL — AB
Specific Gravity, Urine: 1.031 — ABNORMAL HIGH (ref 1.005–1.030)
pH: 5 (ref 5.0–8.0)

## 2019-08-28 LAB — HEPATIC FUNCTION PANEL
ALT: 58 U/L — ABNORMAL HIGH (ref 0–44)
AST: 56 U/L — ABNORMAL HIGH (ref 15–41)
Albumin: 3.8 g/dL (ref 3.5–5.0)
Alkaline Phosphatase: 110 U/L (ref 38–126)
Bilirubin, Direct: 0.1 mg/dL (ref 0.0–0.2)
Indirect Bilirubin: 0.6 mg/dL (ref 0.3–0.9)
Total Bilirubin: 0.7 mg/dL (ref 0.3–1.2)
Total Protein: 7.3 g/dL (ref 6.5–8.1)

## 2019-08-28 NOTE — Progress Notes (Signed)
PCP - Earlene Plater Cardiologist - na  PPM/ICD - na Device Orders -  Rep Notified -   Chest x-ray - 05/12/19 EKG - 05/12/19 Stress Test - na ECHO - na Cardiac Cath - na  Sleep Study - na CPAP -   Fasting Blood Sugar - na Checks Blood Sugar _____ times a day  Blood Thinner Instructions: Aspirin Instructions: pt. Has stopped several days ago  ERAS Protcol -yes PRE-SURGERY Ensure or G2- ensure  COVID TEST- 10-23   Anesthesia review:   Patient denies shortness of breath, fever, cough and chest pain at PAT appointment   All instructions explained to the patient, with a verbal understanding of the material. Patient agrees to go over the instructions while at home for a better understanding. Patient also instructed to self quarantine after being tested for COVID-19. The opportunity to ask questions was provided.

## 2019-08-28 NOTE — Pre-Procedure Instructions (Signed)
[id number] [name], [place] [address] Phone: [phone] Fax: [phone]                 Your procedure is scheduled on Tuesday,  October 27              Report to [facility] Main Entrance "A" at 5:30 A.M., and check in at the Admitting office.             Call this number if you have problems the morning of surgery:             [phone]  Call [phone] if you have any questions prior to your surgery date Monday-Friday 8am-4pm               Remember:             Do not eat after midnight the night before your surgery  You may drink clear liquids until 4:30am the morning of your surgery.   Clear liquids allowed are: Water, Non-Citrus Juices (without pulp), Carbonated Beverages, Clear Tea, Black Coffee Only, and Gatorade  Please complete your PRE-SURGERY ENSURE that was provided to you by 4:30a.m. the morning of surgery.  Please, if able, drink it in one setting. DO NOT SIP.  Enhanced Recovery after Surgery for Orthopedics Enhanced Recovery after Surgery is a protocol used to improve the stress on your body and your recovery after surgery.  Patient Instructions   The night before surgery:  ? No food after midnight. ONLY clear liquids after midnight     The day of surgery (if you do NOT have diabetes):  ? Drink ONE (1) Pre-Surgery Clear Ensure as directed.   ? This drink was given to you during your hospital  pre-op appointment visit. ? The pre-op nurse will instruct you on the time to drink the  Pre-Surgery Ensure depending on your surgery time. ? Finish the drink at the designated time by the pre-op nurse.  ? Nothing else to drink after completing the  Pre-Surgery Clear Ensure.         If you have questions, please contact your surgeon's office.                           Take these medicines the morning of surgery with A SIP OF WATER: Methocarbamol (Robaxin) - if needed  Pantoprazole (Protonix)  Follow your surgeon's instructions on when to stop Aspirin.  If no instructions were given by your surgeon then you will need to call the office to get those instructions.    As of today, STOP taking any Meloxicam (Mobic); Aspirin (unless otherwise instructed by your surgeon), Aleve, Naproxen, Ibuprofen, Motrin, Advil, Goody's, BC's, all herbal medications, fish oil, and all vitamins.               The Morning of Surgery             Do not wear jewelry.             Do not wear lotions, powders, colognes, or deodorant             Men may shave face and neck.             Do not bring valuables to the hospital.             [facility] is not responsible for any belongings or valuables.  If you are a smoker, DO NOT Smoke 24 hours prior to surgery  If you wear a CPAP at night please bring your mask, tubing, and machine the morning of surgery   Remember that you must have someone to transport you home after your surgery, and remain with you for 24 hours if you are discharged the same day.   Contacts, eyeglasses, hearing aids, dentures or bridgework may not be worn into surgery.    Leave your suitcase in the car.  After surgery it may be brought to your room.  For patients admitted to the hospital, discharge time will be determined by your treatment team.  Patients discharged the day of surgery will not be allowed to drive home.    Special instructions:   [facility]- Preparing For Surgery  Before surgery, you can play an important role. Because skin is not sterile, your skin needs to be as free of germs as possible. You can reduce the number of germs on your skin by washing with CHG (chlorahexidine gluconate) Soap before surgery.  CHG is an antiseptic cleaner which kills germs and bonds with the skin to continue killing germs even after washing.    Oral Hygiene is also important to reduce your risk of infection.  Remember - BRUSH YOUR TEETH THE  MORNING OF SURGERY WITH YOUR REGULAR TOOTHPASTE  Please do not use if you have an allergy to CHG or antibacterial soaps. If your skin becomes reddened/irritated stop using the CHG.  Do not shave (including legs and underarms) for at least 48 hours prior to first CHG shower. It is OK to shave your face.  Please follow these instructions carefully.                                                                                                                               1. Shower the NIGHT BEFORE SURGERY and the MORNING OF SURGERY with CHG Soap.   2. If you chose to wash your hair, wash your hair first as usual with your normal shampoo.  3. After you shampoo, rinse your hair and body thoroughly to remove the shampoo.  4. Use CHG as you would any other liquid soap. You can apply CHG directly to the skin and wash gently with a scrungie or a clean washcloth.   5. Apply the CHG Soap to your body ONLY FROM THE NECK DOWN.  Do not use on open wounds or open sores. Avoid contact with your eyes, ears, mouth and genitals (private parts). Wash Face and genitals (private parts)  with your normal soap.   6. Wash thoroughly, paying special attention to the area where your surgery will be performed.  7. Thoroughly rinse your body with warm water from the neck down.  8. DO NOT shower/wash with your normal soap after using and rinsing off the CHG Soap.  9. Pat yourself dry with a CLEAN TOWEL.  10. Wear CLEAN PAJAMAS to bed  the night before surgery, wear comfortable clothes the morning of surgery  11. Place CLEAN SHEETS on your bed the night of your first shower and DO NOT SLEEP WITH PETS.    Day of Surgery:              Please shower the morning of surgery with the CHG soap Do not apply any deodorants/lotions.  Please wear clean clothes to the hospital/surgery center.   Remember to brush your teeth WITH YOUR REGULAR TOOTHPASTE.   Please read over the following fact sheets that  you were given.

## 2019-08-29 LAB — URINE CULTURE: Culture: <10000 — AB

## 2019-08-30 ENCOUNTER — Other Ambulatory Visit (HOSPITAL_COMMUNITY)
Admission: RE | Admit: 2019-08-30 | Discharge: 2019-08-30 | Disposition: A | Discharge status: Home / Self Care | Payer: No Typology Code available for payment source | Source: Ambulatory Visit | Attending: Orthopedic Surgery | Admitting: Orthopedic Surgery

## 2019-08-30 DIAGNOSIS — Z01812 Encounter for preprocedural laboratory examination: Secondary | ICD-10-CM | POA: Diagnosis present

## 2019-08-30 DIAGNOSIS — Z20828 Contact with and (suspected) exposure to other viral communicable diseases: Secondary | ICD-10-CM | POA: Diagnosis not present

## 2019-08-31 LAB — NOVEL CORONAVIRUS, NAA (HOSP ORDER, SEND-OUT TO REF LAB; TAT 18-24 HRS): SARS-CoV-2, NAA: NOT DETECTED

## 2019-09-02 ENCOUNTER — Encounter (HOSPITAL_COMMUNITY): Payer: Self-pay | Admitting: Anesthesiology

## 2019-09-02 ENCOUNTER — Inpatient Hospital Stay: Payer: No Typology Code available for payment source | Admitting: Orthopedic Surgery

## 2019-09-02 NOTE — Anesthesia Preprocedure Evaluation (Addendum)
Anesthesia Evaluation  Patient identified by MRN, date of birth, ID band Patient awake    Reviewed: Allergy & Precautions, NPO status , Patient's Chart, lab work & pertinent test results  Airway Mallampati: II  TM Distance: >3 FB     Dental  (+) Upper Dentures   Pulmonary Current Smoker and Patient abstained from smoking.,  Hx/o TB   Pulmonary exam normal breath sounds clear to auscultation       Cardiovascular negative cardio ROS Normal cardiovascular exam Rhythm:Regular Rate:Normal     Neuro/Psych negative neurological ROS  negative psych ROS   GI/Hepatic GERD  Controlled and Medicated,(+) Hepatitis -, C  Endo/Other  negative endocrine ROS  Renal/GU negative Renal ROS     Musculoskeletal  (+) Arthritis , Osteoarthritis,  Right shoulder rotator cuff arthropathy   Abdominal   Peds  Hematology negative hematology ROS (+)   Anesthesia Other Findings   Reproductive/Obstetrics                            Anesthesia Physical Anesthesia Plan  ASA: II  Anesthesia Plan: General   Post-op Pain Management:  Regional for Post-op pain   Induction: Intravenous  PONV Risk Score and Plan: 2 and Midazolam, Ondansetron and Treatment may vary due to age or medical condition  Airway Management Planned: Oral ETT  Additional Equipment:   Intra-op Plan:   Post-operative Plan: Extubation in OR  Informed Consent: I have reviewed the patients History and Physical, chart, labs and discussed the procedure including the risks, benefits and alternatives for the proposed anesthesia with the patient or authorized representative who has indicated his/her understanding and acceptance.     Dental advisory given  Plan Discussed with: Surgeon and CRNA  Anesthesia Plan Comments:        Anesthesia Quick Evaluation

## 2019-09-03 ENCOUNTER — Encounter (HOSPITAL_COMMUNITY)
Admission: RE | Disposition: A | Discharge status: Home / Self Care | Payer: Self-pay | Source: Home / Self Care | Attending: Orthopedic Surgery

## 2019-09-03 ENCOUNTER — Inpatient Hospital Stay (HOSPITAL_COMMUNITY): Payer: No Typology Code available for payment source

## 2019-09-03 ENCOUNTER — Other Ambulatory Visit: Payer: Self-pay

## 2019-09-03 ENCOUNTER — Encounter (HOSPITAL_COMMUNITY): Payer: Self-pay | Admitting: *Deleted

## 2019-09-03 ENCOUNTER — Inpatient Hospital Stay (HOSPITAL_COMMUNITY)
Admission: RE | Admit: 2019-09-03 | Discharge: 2019-09-04 | DRG: 483 | Disposition: A | Discharge status: Home / Self Care | Payer: No Typology Code available for payment source | Attending: Orthopedic Surgery | Admitting: Orthopedic Surgery

## 2019-09-03 ENCOUNTER — Inpatient Hospital Stay (HOSPITAL_COMMUNITY): Payer: No Typology Code available for payment source | Admitting: Certified Registered Nurse Anesthetist

## 2019-09-03 DIAGNOSIS — Z7982 Long term (current) use of aspirin: Secondary | ICD-10-CM

## 2019-09-03 DIAGNOSIS — Z79899 Other long term (current) drug therapy: Secondary | ICD-10-CM | POA: Diagnosis not present

## 2019-09-03 DIAGNOSIS — K219 Gastro-esophageal reflux disease without esophagitis: Secondary | ICD-10-CM | POA: Diagnosis present

## 2019-09-03 DIAGNOSIS — Z972 Presence of dental prosthetic device (complete) (partial): Secondary | ICD-10-CM | POA: Diagnosis not present

## 2019-09-03 DIAGNOSIS — Z8611 Personal history of tuberculosis: Secondary | ICD-10-CM | POA: Diagnosis not present

## 2019-09-03 DIAGNOSIS — M19019 Primary osteoarthritis, unspecified shoulder: Secondary | ICD-10-CM

## 2019-09-03 DIAGNOSIS — M19011 Primary osteoarthritis, right shoulder: Secondary | ICD-10-CM | POA: Diagnosis present

## 2019-09-03 DIAGNOSIS — M75101 Unspecified rotator cuff tear or rupture of right shoulder, not specified as traumatic: Secondary | ICD-10-CM | POA: Diagnosis not present

## 2019-09-03 DIAGNOSIS — G8929 Other chronic pain: Secondary | ICD-10-CM

## 2019-09-03 DIAGNOSIS — M25511 Pain in right shoulder: Secondary | ICD-10-CM

## 2019-09-03 DIAGNOSIS — M12819 Other specific arthropathies, not elsewhere classified, unspecified shoulder: Secondary | ICD-10-CM | POA: Diagnosis present

## 2019-09-03 DIAGNOSIS — F17210 Nicotine dependence, cigarettes, uncomplicated: Secondary | ICD-10-CM | POA: Diagnosis not present

## 2019-09-03 DIAGNOSIS — Z791 Long term (current) use of non-steroidal anti-inflammatories (NSAID): Secondary | ICD-10-CM | POA: Diagnosis not present

## 2019-09-03 DIAGNOSIS — Z9889 Other specified postprocedural states: Secondary | ICD-10-CM

## 2019-09-03 HISTORY — PX: REVERSE SHOULDER ARTHROPLASTY: SHX5054

## 2019-09-03 SURGERY — ARTHROPLASTY, SHOULDER, TOTAL, REVERSE
Anesthesia: General | Site: Shoulder | Laterality: Right

## 2019-09-03 MED ORDER — PHENYLEPHRINE 40 MCG/ML (10ML) SYRINGE FOR IV PUSH (FOR BLOOD PRESSURE SUPPORT)
PREFILLED_SYRINGE | INTRAVENOUS | Status: DC | PRN
  Administered 2019-09-03: 80 ug via INTRAVENOUS
  Administered 2019-09-03: 120 ug via INTRAVENOUS

## 2019-09-03 MED ORDER — BUPIVACAINE HCL 0.5 % IJ SOLN
INTRAMUSCULAR | Status: DC | PRN
  Administered 2019-09-03: 20 mL

## 2019-09-03 MED ORDER — 0.9 % SODIUM CHLORIDE (POUR BTL) OPTIME
TOPICAL | Status: DC | PRN
  Administered 2019-09-03 (×4): 1000 mL

## 2019-09-03 MED ORDER — ONDANSETRON HCL 4 MG/2ML IJ SOLN
INTRAMUSCULAR | Status: DC | PRN
  Administered 2019-09-03: 4 mg via INTRAVENOUS

## 2019-09-03 MED ORDER — ONDANSETRON HCL 4 MG/2ML IJ SOLN
INTRAMUSCULAR | Status: AC
  Filled 2019-09-03: qty 2

## 2019-09-03 MED ORDER — PROPOFOL 10 MG/ML IV BOLUS
INTRAVENOUS | Status: AC
  Filled 2019-09-03: qty 20

## 2019-09-03 MED ORDER — DOCUSATE SODIUM 100 MG PO CAPS
100.0000 mg | ORAL_CAPSULE | Freq: Two times a day (BID) | ORAL | Status: DC
  Administered 2019-09-03 – 2019-09-04 (×2): 100 mg via ORAL
  Filled 2019-09-03 (×2): qty 1

## 2019-09-03 MED ORDER — BUPIVACAINE LIPOSOME 1.3 % IJ SUSP
INTRAMUSCULAR | Status: DC | PRN
  Administered 2019-09-03: 10 mL via PERINEURAL

## 2019-09-03 MED ORDER — METOCLOPRAMIDE HCL 5 MG/ML IJ SOLN: 5.0000 mg | Freq: Three times a day (TID) | INTRAMUSCULAR | Status: DC | PRN

## 2019-09-03 MED ORDER — GLYCOPYRROLATE PF 0.2 MG/ML IJ SOSY
PREFILLED_SYRINGE | INTRAMUSCULAR | Status: AC
  Filled 2019-09-03: qty 1

## 2019-09-03 MED ORDER — MIDAZOLAM HCL 2 MG/2ML IJ SOLN
INTRAMUSCULAR | Status: AC
  Filled 2019-09-03: qty 2

## 2019-09-03 MED ORDER — FENTANYL CITRATE (PF) 100 MCG/2ML IJ SOLN
INTRAMUSCULAR | Status: AC
  Administered 2019-09-03: 25 ug via INTRAVENOUS
  Filled 2019-09-03: qty 2

## 2019-09-03 MED ORDER — IRRISEPT - 450ML BOTTLE WITH 0.05% CHG IN STERILE WATER, USP 99.95% OPTIME
TOPICAL | Status: DC | PRN
  Administered 2019-09-03: 450 mL

## 2019-09-03 MED ORDER — ONDANSETRON HCL 4 MG/2ML IJ SOLN: 4.0000 mg | Freq: Once | INTRAMUSCULAR | Status: DC | PRN

## 2019-09-03 MED ORDER — PHENYLEPHRINE 40 MCG/ML (10ML) SYRINGE FOR IV PUSH (FOR BLOOD PRESSURE SUPPORT)
PREFILLED_SYRINGE | INTRAVENOUS | Status: AC
  Filled 2019-09-03: qty 10

## 2019-09-03 MED ORDER — TRAMADOL HCL 50 MG PO TABS
50.0000 mg | ORAL_TABLET | Freq: Four times a day (QID) | ORAL | Status: DC
  Administered 2019-09-03 – 2019-09-04 (×4): 50 mg via ORAL
  Filled 2019-09-03 (×4): qty 1

## 2019-09-03 MED ORDER — ASPIRIN 81 MG PO CHEW
81.0000 mg | CHEWABLE_TABLET | Freq: Every day | ORAL | Status: DC
  Administered 2019-09-03 – 2019-09-04 (×2): 81 mg via ORAL
  Filled 2019-09-03 (×2): qty 1

## 2019-09-03 MED ORDER — PHENOL 1.4 % MT LIQD: 1.0000 | OROMUCOSAL | Status: DC | PRN

## 2019-09-03 MED ORDER — ONDANSETRON HCL 4 MG/2ML IJ SOLN: 4.0000 mg | Freq: Four times a day (QID) | INTRAMUSCULAR | Status: DC | PRN

## 2019-09-03 MED ORDER — PROPOFOL 10 MG/ML IV BOLUS
INTRAVENOUS | Status: DC | PRN
  Administered 2019-09-03: 150 mg via INTRAVENOUS
  Administered 2019-09-03: 50 mg via INTRAVENOUS

## 2019-09-03 MED ORDER — MIDAZOLAM HCL 2 MG/2ML IJ SOLN
INTRAMUSCULAR | Status: DC | PRN
  Administered 2019-09-03 (×2): 1 mg via INTRAVENOUS

## 2019-09-03 MED ORDER — FENTANYL CITRATE (PF) 100 MCG/2ML IJ SOLN
25.0000 ug | INTRAMUSCULAR | Status: DC | PRN
  Administered 2019-09-03: 50 ug via INTRAVENOUS
  Administered 2019-09-03: 13:00:00 25 ug via INTRAVENOUS
  Administered 2019-09-03: 50 ug via INTRAVENOUS
  Administered 2019-09-03: 15:00:00 25 ug via INTRAVENOUS

## 2019-09-03 MED ORDER — DEXAMETHASONE SODIUM PHOSPHATE 10 MG/ML IJ SOLN
INTRAMUSCULAR | Status: DC | PRN
  Administered 2019-09-03: 5 mg via INTRAVENOUS

## 2019-09-03 MED ORDER — HYDROMORPHONE HCL 1 MG/ML IJ SOLN
0.5000 mg | INTRAMUSCULAR | Status: DC | PRN
  Administered 2019-09-04: 0.5 mg via INTRAVENOUS
  Filled 2019-09-03: qty 1

## 2019-09-03 MED ORDER — EPHEDRINE SULFATE-NACL 50-0.9 MG/10ML-% IV SOSY
PREFILLED_SYRINGE | INTRAVENOUS | Status: DC | PRN
  Administered 2019-09-03 (×2): 10 mg via INTRAVENOUS

## 2019-09-03 MED ORDER — GLYCOPYRROLATE PF 0.2 MG/ML IJ SOSY
PREFILLED_SYRINGE | INTRAMUSCULAR | Status: DC | PRN
  Administered 2019-09-03: .2 mg via INTRAVENOUS

## 2019-09-03 MED ORDER — FENTANYL CITRATE (PF) 250 MCG/5ML IJ SOLN
INTRAMUSCULAR | Status: AC
  Filled 2019-09-03: qty 5

## 2019-09-03 MED ORDER — OXYCODONE HCL 5 MG PO TABS
5.0000 mg | ORAL_TABLET | ORAL | Status: DC | PRN
  Administered 2019-09-03 – 2019-09-04 (×3): 5 mg via ORAL
  Administered 2019-09-04: 10 mg via ORAL
  Filled 2019-09-03 (×2): qty 1
  Filled 2019-09-03: qty 2
  Filled 2019-09-03: qty 1

## 2019-09-03 MED ORDER — LIDOCAINE 2% (20 MG/ML) 5 ML SYRINGE
INTRAMUSCULAR | Status: AC
  Filled 2019-09-03: qty 5

## 2019-09-03 MED ORDER — CEFAZOLIN SODIUM-DEXTROSE 2-4 GM/100ML-% IV SOLN
2.0000 g | INTRAVENOUS | Status: AC
  Administered 2019-09-03: 08:00:00 2 g via INTRAVENOUS
  Filled 2019-09-03: qty 100

## 2019-09-03 MED ORDER — LACTATED RINGERS IV SOLN
INTRAVENOUS | Status: DC | PRN
  Administered 2019-09-03 (×2): via INTRAVENOUS

## 2019-09-03 MED ORDER — CEFAZOLIN SODIUM-DEXTROSE 2-4 GM/100ML-% IV SOLN
2.0000 g | Freq: Four times a day (QID) | INTRAVENOUS | Status: AC
  Administered 2019-09-03 (×2): 2 g via INTRAVENOUS
  Filled 2019-09-03 (×2): qty 100

## 2019-09-03 MED ORDER — SUCCINYLCHOLINE CHLORIDE 200 MG/10ML IV SOSY
PREFILLED_SYRINGE | INTRAVENOUS | Status: DC | PRN
  Administered 2019-09-03: 120 mg via INTRAVENOUS

## 2019-09-03 MED ORDER — DEXAMETHASONE SODIUM PHOSPHATE 10 MG/ML IJ SOLN
INTRAMUSCULAR | Status: AC
  Filled 2019-09-03: qty 1

## 2019-09-03 MED ORDER — METHOCARBAMOL 1000 MG/10ML IJ SOLN
500.0000 mg | Freq: Four times a day (QID) | INTRAVENOUS | Status: DC | PRN
  Filled 2019-09-03: qty 5

## 2019-09-03 MED ORDER — FENTANYL CITRATE (PF) 250 MCG/5ML IJ SOLN
INTRAMUSCULAR | Status: DC | PRN
  Administered 2019-09-03 (×5): 50 ug via INTRAVENOUS

## 2019-09-03 MED ORDER — CHLORHEXIDINE GLUCONATE 4 % EX LIQD: 60.0000 mL | Freq: Once | CUTANEOUS | Status: DC

## 2019-09-03 MED ORDER — LACTATED RINGERS IV SOLN
INTRAVENOUS | Status: DC
  Administered 2019-09-03 – 2019-09-04 (×2): via INTRAVENOUS

## 2019-09-03 MED ORDER — VANCOMYCIN HCL 1000 MG IV SOLR
INTRAVENOUS | Status: AC
  Filled 2019-09-03: qty 1000

## 2019-09-03 MED ORDER — TRANEXAMIC ACID-NACL 1000-0.7 MG/100ML-% IV SOLN
1000.0000 mg | INTRAVENOUS | Status: AC
  Administered 2019-09-03: 1000 mg via INTRAVENOUS
  Filled 2019-09-03: qty 100

## 2019-09-03 MED ORDER — VANCOMYCIN HCL 1000 MG IV SOLR
INTRAVENOUS | Status: DC | PRN
  Administered 2019-09-03: 1000 mg

## 2019-09-03 MED ORDER — STERILE WATER FOR IRRIGATION IR SOLN
Status: DC | PRN
  Administered 2019-09-03: 1000 mL

## 2019-09-03 MED ORDER — SUCCINYLCHOLINE CHLORIDE 200 MG/10ML IV SOSY
PREFILLED_SYRINGE | INTRAVENOUS | Status: AC
  Filled 2019-09-03: qty 10

## 2019-09-03 MED ORDER — EPHEDRINE 5 MG/ML INJ
INTRAVENOUS | Status: AC
  Filled 2019-09-03: qty 10

## 2019-09-03 MED ORDER — METHOCARBAMOL 500 MG PO TABS
500.0000 mg | ORAL_TABLET | Freq: Four times a day (QID) | ORAL | Status: DC | PRN
  Administered 2019-09-03 – 2019-09-04 (×2): 500 mg via ORAL
  Filled 2019-09-03 (×2): qty 1

## 2019-09-03 MED ORDER — ACETAMINOPHEN 325 MG PO TABS: 325.0000 mg | ORAL_TABLET | Freq: Four times a day (QID) | ORAL | Status: DC | PRN

## 2019-09-03 MED ORDER — ONDANSETRON HCL 4 MG PO TABS: 4.0000 mg | ORAL_TABLET | Freq: Four times a day (QID) | ORAL | Status: DC | PRN

## 2019-09-03 MED ORDER — MENTHOL 3 MG MT LOZG: 1.0000 | LOZENGE | OROMUCOSAL | Status: DC | PRN

## 2019-09-03 MED ORDER — SODIUM CHLORIDE 0.9 % IV SOLN
INTRAVENOUS | Status: DC | PRN
  Administered 2019-09-03: 25 ug/min via INTRAVENOUS

## 2019-09-03 MED ORDER — METOCLOPRAMIDE HCL 5 MG PO TABS: 5.0000 mg | ORAL_TABLET | Freq: Three times a day (TID) | ORAL | Status: DC | PRN

## 2019-09-03 MED ORDER — LIDOCAINE 2% (20 MG/ML) 5 ML SYRINGE
INTRAMUSCULAR | Status: DC | PRN
  Administered 2019-09-03: 60 mg via INTRAVENOUS

## 2019-09-03 MED ORDER — NON FORMULARY: Status: DC | PRN

## 2019-09-03 SURGICAL SUPPLY — 88 items
AID PSTN UNV HD RSTRNT DISP (MISCELLANEOUS) ×1
ALCOHOL 70% 16 OZ (MISCELLANEOUS) ×3 IMPLANT
APL PRP STRL LF DISP 70% ISPRP (MISCELLANEOUS) ×1
AUG COMP REV MI TAPER ADAPTER (Joint) ×3 IMPLANT
AUGMENT COMP REV MI TAPR ADPTR (Joint) IMPLANT
BEARING HUMERAL SHLDER 36M STD (Shoulder) IMPLANT
BIT DRILL 2.7 W/STOP DISP (BIT) ×1 IMPLANT
BIT DRILL 2.7MM W/STOP DISP (BIT) ×1
BIT DRILL TWIST 2.7 (BIT) ×1 IMPLANT
BIT DRILL TWIST 2.7MM (BIT) ×1
BLADE SAW SGTL 13X75X1.27 (BLADE) ×3 IMPLANT
BRNG HUM STD 36 RVRS SHLDR (Shoulder) ×1 IMPLANT
BSPLAT GLND SM AUG TPR ADPR (Joint) ×1 IMPLANT
CHLORAPREP W/TINT 26 (MISCELLANEOUS) ×3 IMPLANT
CLOSURE STERI-STRIP 1/2X4 (GAUZE/BANDAGES/DRESSINGS) ×1
CLOSURE WOUND 1/2 X4 (GAUZE/BANDAGES/DRESSINGS) ×1
CLSR STERI-STRIP ANTIMIC 1/2X4 (GAUZE/BANDAGES/DRESSINGS) ×1 IMPLANT
COVER SURGICAL LIGHT HANDLE (MISCELLANEOUS) ×3 IMPLANT
COVER WAND RF STERILE (DRAPES) ×3 IMPLANT
DRAPE INCISE IOBAN 66X45 STRL (DRAPES) ×3 IMPLANT
DRAPE U-SHAPE 47X51 STRL (DRAPES) ×6 IMPLANT
DRSG AQUACEL AG ADV 3.5X10 (GAUZE/BANDAGES/DRESSINGS) ×3 IMPLANT
ELECT BLADE 4.0 EZ CLEAN MEGAD (MISCELLANEOUS) ×3
ELECT REM PT RETURN 9FT ADLT (ELECTROSURGICAL) ×3
ELECTRODE BLDE 4.0 EZ CLN MEGD (MISCELLANEOUS) ×1 IMPLANT
ELECTRODE REM PT RTRN 9FT ADLT (ELECTROSURGICAL) ×1 IMPLANT
GAUZE SPONGE 4X4 12PLY STRL LF (GAUZE/BANDAGES/DRESSINGS) ×3 IMPLANT
GLENOID SPHERE 36MM CVD +3 (Orthopedic Implant) ×2 IMPLANT
GLOVE BIOGEL PI IND STRL 7.5 (GLOVE) ×1 IMPLANT
GLOVE BIOGEL PI IND STRL 8 (GLOVE) ×1 IMPLANT
GLOVE BIOGEL PI INDICATOR 7.5 (GLOVE) ×2
GLOVE BIOGEL PI INDICATOR 8 (GLOVE) ×2
GLOVE ECLIPSE 7.0 STRL STRAW (GLOVE) ×3 IMPLANT
GLOVE SURG ORTHO 8.0 STRL STRW (GLOVE) ×3 IMPLANT
GOWN STRL REUS W/ TWL LRG LVL3 (GOWN DISPOSABLE) ×2 IMPLANT
GOWN STRL REUS W/ TWL XL LVL3 (GOWN DISPOSABLE) IMPLANT
GOWN STRL REUS W/TWL LRG LVL3 (GOWN DISPOSABLE) ×6
GOWN STRL REUS W/TWL XL LVL3 (GOWN DISPOSABLE)
GUIDE MODEL REV SHLD RT (ORTHOPEDIC DISPOSABLE SUPPLIES) ×2 IMPLANT
HYDROGEN PEROXIDE 16OZ (MISCELLANEOUS) ×3 IMPLANT
JET LAVAGE IRRISEPT WOUND (IRRIGATION / IRRIGATOR) ×3
KIT BASIN OR (CUSTOM PROCEDURE TRAY) ×3 IMPLANT
KIT TURNOVER KIT B (KITS) ×3 IMPLANT
LAVAGE JET IRRISEPT WOUND (IRRIGATION / IRRIGATOR) ×1 IMPLANT
LOOP VESSEL MAXI BLUE (MISCELLANEOUS) ×3 IMPLANT
MANIFOLD NEPTUNE II (INSTRUMENTS) ×3 IMPLANT
NDL SUT 6 .5 CRC .975X.05 MAYO (NEEDLE) ×1 IMPLANT
NDL TAPERED W/ NITINOL LOOP (MISCELLANEOUS) ×1 IMPLANT
NEEDLE MAYO TAPER (NEEDLE) ×3
NEEDLE TAPERED W/ NITINOL LOOP (MISCELLANEOUS) ×3 IMPLANT
NS IRRIG 1000ML POUR BTL (IV SOLUTION) ×3 IMPLANT
PACK SHOULDER (CUSTOM PROCEDURE TRAY) ×3 IMPLANT
PAD ARMBOARD 7.5X6 YLW CONV (MISCELLANEOUS) ×6 IMPLANT
PASSER SUT SWANSON 36MM LOOP (INSTRUMENTS) ×3 IMPLANT
PIN THREADED REVERSE (PIN) ×4 IMPLANT
REAMER GUIDE BUSHING SURG DISP (MISCELLANEOUS) ×2 IMPLANT
REAMER GUIDE W/SCREW AUG (MISCELLANEOUS) ×2 IMPLANT
RESTRAINT HEAD UNIVERSAL NS (MISCELLANEOUS) ×3 IMPLANT
SCREW BONE LOCKING 4.75X30X3.5 (Screw) ×2 IMPLANT
SCREW BONE STRL 6.5MMX25MM (Screw) ×2 IMPLANT
SCREW LOCKING 4.75MMX15MM (Screw) ×4 IMPLANT
SCREW LOCKING NS 4.75MMX20MM (Screw) ×2 IMPLANT
SHOULDER HUMERAL BEAR 36M STD (Shoulder) ×3 IMPLANT
SLING ARM IMMOBILIZER LRG (SOFTGOODS) ×1 IMPLANT
SLING ARM IMMOBILIZER MED (SOFTGOODS) ×2 IMPLANT
SOL PREP POV-IOD 4OZ 10% (MISCELLANEOUS) ×3 IMPLANT
SPONGE LAP 18X18 RF (DISPOSABLE) ×3 IMPLANT
STEM HUMERAL STRL 12MMX83MM (Stem) ×2 IMPLANT
STRIP CLOSURE SKIN 1/2X4 (GAUZE/BANDAGES/DRESSINGS) ×2 IMPLANT
SUCTION FRAZIER HANDLE 10FR (MISCELLANEOUS) ×2
SUCTION TUBE FRAZIER 10FR DISP (MISCELLANEOUS) ×1 IMPLANT
SUT FIBERWIRE #2 38 T-5 BLUE (SUTURE)
SUT MAXBRAID (SUTURE) IMPLANT
SUT MNCRL AB 3-0 PS2 18 (SUTURE) ×3 IMPLANT
SUT SILK 2 0 TIES 10X30 (SUTURE) ×3 IMPLANT
SUT VIC AB 0 CT1 27 (SUTURE) ×12
SUT VIC AB 0 CT1 27XBRD ANBCTR (SUTURE) ×4 IMPLANT
SUT VIC AB 1 CT1 27 (SUTURE) ×6
SUT VIC AB 1 CT1 27XBRD ANBCTR (SUTURE) ×2 IMPLANT
SUT VIC AB 2-0 CT1 27 (SUTURE) ×9
SUT VIC AB 2-0 CT1 TAPERPNT 27 (SUTURE) ×3 IMPLANT
SUT VICRYL 0 UR6 27IN ABS (SUTURE) ×12 IMPLANT
SUTURE FIBERWR #2 38 T-5 BLUE (SUTURE) IMPLANT
TOWEL GREEN STERILE (TOWEL DISPOSABLE) ×3 IMPLANT
TRAY FOL W/BAG SLVR 16FR STRL (SET/KITS/TRAYS/PACK) IMPLANT
TRAY FOLEY W/BAG SLVR 16FR LF (SET/KITS/TRAYS/PACK)
TRAY HUM MINI SHOULDER +3 40 (Joint) ×2 IMPLANT
WATER STERILE IRR 1000ML POUR (IV SOLUTION) ×3 IMPLANT

## 2019-09-03 NOTE — Transfer of Care (Signed)
Immediate Anesthesia Transfer of Care Note  Patient: Stephen [name]  Procedure(s) Performed: RIGHT REVERSE SHOULDER ARTHROPLASTY (Right Shoulder)  Patient Location: PACU  Anesthesia Type:General  Level of Consciousness: awake, alert  and oriented  Airway & Oxygen Therapy: Patient Spontanous Breathing and Patient connected to face mask oxygen  Post-op Assessment: Report given to RN and Post -op Vital signs reviewed and stable  Post vital signs: Reviewed and stable  Last Vitals:  Vitals Value Taken Time  BP 127/74 09/03/19 1021  Temp 36.1 C 09/03/19 1021  Pulse 78 09/03/19 1024  Resp 24 09/03/19 1024  SpO2 100 % 09/03/19 1024  Vitals shown include unvalidated device data.  Last Pain:  Vitals:   09/03/19 0605  PainSc: 7       Patients Stated Pain Goal: 3 ([id number])  Complications: No apparent anesthesia complications

## 2019-09-03 NOTE — Op Note (Signed)
NAME: Stephen [name], Stephen [name]. MEDICAL RECORD ZO:[id number] ACCOUNT [id number] DATE OF BIRTH:[birth year] FACILITY: [facility] LOCATION: [facility] PHYSICIAN:Nivaan Dicenzo Diamantina Providence, MD  OPERATIVE REPORT  DATE OF PROCEDURE:  09/03/2019  PREOPERATIVE DIAGNOSIS:  Right shoulder rotator cuff arthropathy.  POSTOPERATIVE DIAGNOSIS:  Right shoulder rotator cuff arthropathy.  PROCEDURE:  Right shoulder reverse shoulder replacement using Biomet Comprehensive mini humeral stem, 12 x 83 with standard thickness +3 taper offset, 40 mm diameter humeral tray and highly cross-linked ethylene bearing, 36 mm standard diameter, along  with small augmented baseplate with 4 fixed locking screws, 1 central compression screw and [name] 36 +3 offset glenosphere.  SURGEON:  Cammy Copa, MD  ASSISTANT:  Karenann Cai, PA.  INDICATIONS:  The patient is [name] 58 year old patient who has left shoulder rotator cuff arthropathy who presents now for right shoulder rotator cuff arthropathy after explanation of risks and benefits.  He has increased and significant daily pain, as well  as diminished function.  PROCEDURE IN DETAIL:  The patient was brought to the operating room where general anesthetic was induced.  Preoperative antibiotics administered.  Timeout was called.  The patient was placed in the beach chair position with his head in neutral position.   Right arm was pre-scrubbed with hydrogen peroxide, alcohol and Betadine, which was allowed to air dry, prepped with DuraPrep solution and draped in [name] sterile manner.  Ioban as used to cover the operative field.  Timeout was called.  Deltopectoral  approach was made.  Cephalic vein mobilized medially.  The patient had essentially no rotator cuff left, infraspinatus, supraspinatus or subscap.  The biceps tendon was subluxated medially.  It was tenodesed to the pectoralis major tendon.  The  circumflex vessels were ligated.  Subdeltoid space was manually created and adhesions were released.  Deltoid  was partially released from its anterior attachment manually.  The axillary nerve was then visualized, palpated and [name] vessel loop was placed  around it.  It was protected at all times during the remaining portion of the case.  At this time, the capsule was incised off the inferior humeral neck about 2 cm inferior using [name] Cobb elevator with progressive rotation.  This was done to about the 5  o'clock position.  At this time, the proximal aspect of the humeral head was entered.  Reaming and broaching was performed.  Head was cut in 30 degrees of retroversion.  At this time, with [name] 10 broach in, [name] cap was placed and attention was directed  towards the glenoid.  Circumferential release was performed.  Care was taken to avoid injury to the axillary nerve.  Using patient-specific instrumentation and preoperative planning, the glenoid baseplate was placed in about ____ degrees of inferior  version.  [name] superior augment was required.  This gave very good bony contact, 100%.  One central compression screw and 4 peripheral locking screws were placed with excellent purchase obtained with that central screw.  [name] +3 glenosphere was placed with 1.5  mm offset inferiorly.  At this time, attention was directed towards the humerus.  Coplaning was performed.  [name] size 12 stem broach was placed and reduction was performed with the standard offset +3 humeral tray.  This gave very good stability.  It was  difficult to reduce.  It had very good stability with forward flexion, abduction, particularly with extension and adduction in [name] forward directed force.  This was dislocated.  The trial components on the humeral side were removed and the true components  were placed with excellent  purchase obtained.  Same stability parameters were maintained.  Thorough irrigation was then performed.  IrriSept solution was used at all times during the case for diminishment of infection.  Next, vancomycin powder was placed  within the joint.   Deltopectoral interval was then closed using #1 Vicryl suture, followed by more IrriSept solution, vancomycin powder and then closure using 0 Vicryl suture, 2-0 Vicryl suture and 3-0 Monocryl.  Steri-Strips and an Aquacel dressing  placed, along with [name] shoulder immobilizer.  The patient tolerated the procedure well without immediate complications.  He was transferred to the recovery room in stable condition.  Luke's assistance was required at all times during the case for  retraction, mobilization of the tissue, opening and closing.  His assistance was [name] medical necessity.  VN/NUANCE  D:09/03/2019 T:09/03/2019 JOB:[id number]

## 2019-09-03 NOTE — Progress Notes (Signed)
Patient doing well Block still in effect right arm Postop x-rays look good Plan occupational therapy tomorrow then discharge to home using the CPM machine only. Do not anticipate he will need home health OT or PT at this time.

## 2019-09-03 NOTE — H&P (Signed)
Stephen [name] is an 58 y.o. male.   Chief Complaint: Right shoulder pain HPI: Stephen [name] is a 2 year old patient with severe right shoulder pain and limited function.  He is doing well from left reverse shoulder replacement performed months ago.  Was scheduled for right shoulder reverse replacement but sustained a bout of pancreatitis.  He is currently over that.  Presents now for operative management of rotator cuff arthropathy which is end-stage and is failed conservative measures.  Past Medical History:  Diagnosis Date  . Arthritis   . GERD (gastroesophageal reflux disease)   . Pancreatitis   . Tuberculosis    yrs. ago pt. states he was dx. here at [facility]    Past Surgical History:  Procedure Laterality Date  . FRACTURE SURGERY     jaw  . REVERSE SHOULDER ARTHROPLASTY Left 12/11/2018   Procedure: LEFT REVERSE SHOULDER ARTHROPLASTY;  Surgeon: Meredith Pel, MD;  Location: [facility];  Service: Orthopedics;  Laterality: Left;    History reviewed. No pertinent family history. Social History:  reports that he has been smoking cigarettes. He has smoked for the past 40.00 years. He has never used smokeless tobacco. He reports current alcohol use. He reports current drug use. Drug: Marijuana.  Allergies: No Known Allergies  Medications Prior to Admission  Medication Sig Dispense Refill  . meloxicam (MOBIC) 7.5 MG tablet Take 1 tablet (7.5 mg total) by mouth daily. 30 tablet 2  . methocarbamol (ROBAXIN) 500 MG tablet Take 1 tablet (500 mg total) by mouth every 8 (eight) hours as needed for muscle spasms. 90 tablet 1  . oxymetazoline (AFRIN) 0.05 % nasal spray Place 1 spray into both nostrils 2 (two) times daily as needed for congestion.    . pantoprazole (PROTONIX) 40 MG tablet Take 1 tablet (40 mg total) by mouth daily. 60 tablet 1  . acetaminophen-codeine (TYLENOL #3) 300-30 MG tablet Take 1 tablet by mouth every 8 (eight) hours as needed for moderate pain. (Patient not taking: Reported on  08/08/2019) 30 tablet 0  . aspirin EC 81 MG tablet Take 81 mg by mouth daily.    . diclofenac sodium (VOLTAREN) 1 % GEL Apply 2 g topically 4 (four) times daily. (Patient not taking: Reported on 08/08/2019) 50 g 0  . Multiple Vitamin (MULTIVITAMIN WITH MINERALS) TABS tablet Take 1 tablet by mouth daily. (Patient not taking: Reported on 08/08/2019) 30 tablet 0    No results found for this or any previous visit (from the past 48 hour(s)). No results found.  Review of Systems  Musculoskeletal: Positive for joint pain.  All other systems reviewed and are negative.   Blood pressure 129/72, pulse 60, temperature 98.4 F (36.9 C), resp. rate 16, height 5\' 7"  (1.702 m), weight 52 kg, SpO2 100 %. Physical Exam  Constitutional: He appears well-developed.  HENT:  Head: Normocephalic.  Eyes: Pupils are equal, round, and reactive to light.  Neck: Normal range of motion.  Cardiovascular: Normal rate.  Respiratory: Effort normal.  Neurological: He is alert.  Skin: Skin is warm.  Psychiatric: He has a normal mood and affect.  Examination of the right shoulder demonstrates functional deltoid.  Motor sensory function to the hand is intact.  Skin is intact in the shoulder girdle region.  Forward flexion abduction very limited below 90 with weakness to infraspinatus and supraspinatus testing.  Radial pulses intact  Assessment/Plan Impression is end-stage rotator cuff arthropathy with pain and functional loss.  Plan is reverse shoulder replacement.  Although the patient  is young he really has no other options in order to achieve pain control and increased function.  The risk and benefits of surgery are discussed with the patient including but not limited to infection nerve vessel damage dislocation as well as the potential need for revision in his lifetime which could proved to be impossible based on the amount of glenoid bone stock he has remaining.  Patient understands the risk and benefits and wishes to  proceed.  All questions answered.  Anticipate overnight observation and then discharged in the morning.  Anderson Malta, MD 09/03/2019, 7:10 AM

## 2019-09-03 NOTE — Brief Op Note (Signed)
   09/03/2019  10:14 AM  PATIENT:  Stephen [name]  58 y.o. male  PRE-OPERATIVE DIAGNOSIS:  right shoulder rotator cuff arthropathy  POST-OPERATIVE DIAGNOSIS:  right shoulder rotator cuff arthropathy  PROCEDURE:  Procedure(s): RIGHT REVERSE SHOULDER ARTHROPLASTY  SURGEON:  Surgeon(s): Marlou Sa, Tonna Corner, MD  ASSISTANT: magnant pa  ANESTHESIA:   general  EBL: 100 ml    Total I/O In: 1200 [I.V.:1200] Out: 50 [Blood:50]  BLOOD ADMINISTERED: none  DRAINS: none   LOCAL MEDICATIONS USED:  none  SPECIMEN:  No Specimen  COUNTS:  YES  TOURNIQUET:  * No tourniquets in log *  DICTATION: .Other Dictation: Dictation Number [id number]  PLAN OF CARE: Admit for overnight observation  PATIENT DISPOSITION:  PACU - hemodynamically stable

## 2019-09-03 NOTE — Anesthesia Postprocedure Evaluation (Signed)
Anesthesia Post Note  Patient: Stephen [name]  Procedure(s) Performed: RIGHT REVERSE SHOULDER ARTHROPLASTY (Right Shoulder)     Patient location during evaluation: PACU Anesthesia Type: General Level of consciousness: awake and alert and oriented Pain management: pain level controlled Vital Signs Assessment: post-procedure vital signs reviewed and stable Respiratory status: spontaneous breathing, nonlabored ventilation and respiratory function stable Cardiovascular status: blood pressure returned to baseline and stable Postop Assessment: no apparent nausea or vomiting Anesthetic complications: no    Last Vitals:  Vitals:   09/03/19 1121 09/03/19 1136  BP: 110/76 111/69  Pulse: 77 90  Resp: 13 14  Temp:    SpO2: 98% 100%    Last Pain:  Vitals:   09/03/19 1121  PainSc: 8                  Yatziri Wainwright A.

## 2019-09-03 NOTE — Progress Notes (Signed)
1535 Received pt from PACU, A& Ox4. Right shoulder incision with Aquacel dressing dry and intact. Right arm in a sling. Right hand has no sensation yet but with right shoulder pain.

## 2019-09-03 NOTE — Anesthesia Procedure Notes (Signed)
Procedure Name: Intubation Date/Time: 09/03/2019 7:23 AM Performed by: Alain Marion, CRNA Pre-anesthesia Checklist: Patient identified, Emergency Drugs available, Suction available and Patient being monitored Patient Re-evaluated:Patient Re-evaluated prior to induction Oxygen Delivery Method: Circle System Utilized Preoxygenation: Pre-oxygenation with 100% oxygen Induction Type: IV induction Laryngoscope Size: Miller and 2 Grade View: Grade I Tube type: Oral Tube size: 7.5 mm Number of attempts: 1 Airway Equipment and Method: Stylet Placement Confirmation: ETT inserted through vocal cords under direct vision,  positive ETCO2 and breath sounds checked- equal and bilateral Secured at: 22 cm Tube secured with: Tape Dental Injury: Teeth and Oropharynx as per pre-operative assessment

## 2019-09-03 NOTE — Anesthesia Procedure Notes (Signed)
Anesthesia Regional Block: Interscalene brachial plexus block   Pre-Anesthetic Checklist: ,, timeout performed, Correct Patient, Correct Site, Correct Laterality, Correct Procedure, Correct Position, site marked, Risks and benefits discussed,  Surgical consent,  Pre-op evaluation,  At surgeon's request and post-op pain management  Laterality: Right  Prep: chloraprep       Needles:  Injection technique: Single-shot  Needle Type: Echogenic Stimulator Needle     Needle Length: 9cm  Needle Gauge: 21   Needle insertion depth: 5 cm   Additional Needles:   Procedures:,,,, ultrasound used (permanent image in chart),,,,  Narrative:  Start time: 09/03/2019 6:49 AM End time: 09/03/2019 6:54 AM Injection made incrementally with aspirations every 5 mL.  Performed by: Personally  Anesthesiologist: Josephine Igo, MD  Additional Notes: Timeout performed. Patient sedated. Relevant anatomy ID'd using [place]. Incremental 2-43ml injection of LA with frequent aspiration. Patient tolerated procedure well.        Right interscalene block

## 2019-09-04 ENCOUNTER — Encounter (HOSPITAL_COMMUNITY): Payer: Self-pay | Admitting: Orthopedic Surgery

## 2019-09-04 MED ORDER — ASPIRIN EC 81 MG PO TBEC: 81.0000 mg | DELAYED_RELEASE_TABLET | Freq: Every day | ORAL | 0 refills | Status: DC

## 2019-09-04 MED ORDER — OXYCODONE HCL 5 MG PO TABS: 5.0000 mg | ORAL_TABLET | ORAL | 0 refills | Status: DC | PRN

## 2019-09-04 MED ORDER — METHOCARBAMOL 500 MG PO TABS: 500.0000 mg | ORAL_TABLET | Freq: Three times a day (TID) | ORAL | 0 refills | Status: DC | PRN

## 2019-09-04 NOTE — Progress Notes (Signed)
  Subjective: Stephen [name] is a 58 y.o. male s/p right RSA.  They are POD1.  Pt's pain is controlled.  He did well in OT earlier this morning, not requiring any further OT services.    Objective: Vital signs in last 24 hours: Temp:  [97 F (36.1 C)-98.4 F (36.9 C)] 97.6 F (36.4 C) (10/28 0752) Pulse Rate:  [67-109] 74 (10/28 0752) Resp:  [11-23] 16 (10/28 0752) BP: (110-148)/(67-88) 135/85 (10/28 0752) SpO2:  [90 %-100 %] 100 % (10/28 0752) Weight:  [51.5 kg] 51.5 kg (10/27 1532)  Intake/Output from previous day: 10/27 0701 - 10/28 0700 In: 2250.7 [P.O.:222; I.V.:1928.7; IV Piggyback:100] Out: 250 [Urine:200; Blood:50] Intake/Output this shift: No intake/output data recorded.  Exam:  No gross blood or drainage overlying the dressing 2+ radial pulse Sensation intact distally in the right hand Able to extend the right wrist   Labs: No results for input(s): HGB in the last 72 hours. No results for input(s): WBC, RBC, HCT, PLT in the last 72 hours. No results for input(s): NA, K, CL, CO2, BUN, CREATININE, GLUCOSE, CALCIUM in the last 72 hours. No results for input(s): LABPT, INR in the last 72 hours.  Assessment/Plan: Pt is POD1 s/p right RSA    -Plan to discharge to home today  -No lifting with the operative arm  -Okay to shower, dressing is waterproof.  Cautioned patient against soaking dressing in bath/pool/body of water  -Use the CPM machine at least 3 times per day for one hour each time, increasing the degrees daily.     Madylyn Insco L Durinda Buzzelli 09/04/2019, 10:11 AM

## 2019-09-04 NOTE — Evaluation (Signed)
Physical Therapy Evaluation Patient Details Name: Stephen [name] MRN: [id number] DOB: [birth year] Today's Date: 09/04/2019   History of Present Illness  Pt is a 58 yo male s/p R reverese total shoulder arthroplasty. PMHx: L RTSA, arthritis.  Clinical Impression  Pt up in chair upon PT arrival, willing to participate in PT evaluation. Pt was able to demonstrate multiple transfers, ambulation, and stairs without LOB, use of AD, or assist from PT. The patient completed a 5xsit-to-stand (5XSTS) in 16s with no assist and no use of UE. This is above the age-based cut-off of 11.4s and therefore indicates they are at slightly increased risk for falls. The patient also ambulates with a gait speed of 0.86ms which is below the cut off of 1.084m, also indicating that he is at risk for falls. However, the pt was able to demo good dynamic balance with functional activities such as amb and stairs without AD, and therefore is safe to return home and follow surgeon's recommendations for follow-up therapies as appropriate for the RUE.    Follow Up Recommendations Follow surgeon's recommendation for DC plan and follow-up therapies(recommend follow surgeon recommendations for OPPT once appropriate.)    Equipment Recommendations  None recommended by PT    Recommendations for Other Services       Precautions / Restrictions Precautions Precautions: Shoulder Type of Shoulder Precautions: reverse total shoulder Shoulder Interventions: Shoulder sling/immobilizer;At all times Precaution Booklet Issued: Yes (comment) Precaution Comments: verbally discussed Required Braces or Orthoses: Sling Restrictions Weight Bearing Restrictions: Yes RUE Weight Bearing: Non weight bearing      Mobility  Bed Mobility Overal bed mobility: Modified Independent             General bed mobility comments: Pt OOB in chair upon PT arrival  Transfers Overall transfer level: Independent Equipment used: None              General transfer comment: Pt demoed multiple transfers from recliner without AD or use of UE, no LOB  Ambulation/Gait Ambulation/Gait assistance: Supervision Gait Distance (Feet): 150 Feet Assistive device: None Gait Pattern/deviations: WFL(Within Functional Limits) Gait velocity: 0.67 m/s Gait velocity interpretation: <1.8 ft/sec, indicate of risk for recurrent falls    Stairs Stairs: Yes Stairs assistance: Supervision Stair Management: One rail Right;Alternating pattern;Forwards Number of Stairs: 4 General stair comments: Pt was able to navigate 4 steps safely with supervision, limited by IV not by strength or endurance  Wheelchair Mobility    Modified Rankin (Stroke Patients Only)       Balance Overall balance assessment: Independent           Standing balance-Leahy Scale: Good Standing balance comment: Pt able to stand without AD and  complete amb without LOB or AD     Tandem Stance - Right Leg: 30 Tandem Stance - Left Leg: 18 [place] - Eyes Opened: 30 [place] - Eyes Closed: 30                 Pertinent Vitals/Pain Pain Assessment: 0-10 Pain Score: 8  Pain Location: R shoulder Pain Descriptors / Indicators: Discomfort;Throbbing Pain Intervention(s): RN gave pain meds during session;Limited activity within patient's tolerance;Monitored during session    [place] to be discharged to:: Private residence Living Arrangements: Other relatives Available Help at Discharge: Family;Available 24 hours/day Type of Home: House Home Access: Stairs to enter Entrance Stairs-Rails: Left(left going up) Entrance Stairs-Number of Steps: 5 Home Layout: One level Home Equipment: None Additional Comments: lives with nephew who works during the  day    Prior Function Level of Independence: Independent         Comments: reports limited by pain but independent ADLs, driving and IADLs     Hand Dominance   Dominant Hand: Right     Extremity/Trunk Assessment   Upper Extremity Assessment Upper Extremity Assessment: Defer to OT evaluation RUE Deficits / Details: s/p R RTSA, elbow wrist and digits, WFLs for AROM RUE: Unable to fully assess due to pain;Unable to fully assess due to immobilization RUE Coordination: decreased gross motor    Lower Extremity Assessment Lower Extremity Assessment: Overall WFL for tasks assessed    Cervical / Trunk Assessment Cervical / Trunk Assessment: Normal  Communication   Communication: No difficulties  Cognition Arousal/Alertness: Awake/alert Behavior During Therapy: WFL for tasks assessed/performed Overall Cognitive Status: Within Functional Limits for tasks assessed                                        General Comments General comments (skin integrity, edema, etc.): Pt reports that he has family at home to assist as needed    Exercises Other Exercises Other Exercises: elbow, wrist and hand flex/ext  x10 reps.   Assessment/Plan    PT Assessment All further PT needs can be met in the next venue of care  PT Problem List Decreased mobility;Decreased range of motion       PT Treatment Interventions      PT Goals (Current goals can be found in the Care Plan section)  Acute Rehab PT Goals Patient Stated Goal: to go home PT Goal Formulation: With patient Time For Goal Achievement: 09/11/19 Potential to Achieve Goals: Good    Frequency     Barriers to discharge        Co-evaluation               AM-PAC PT "6 Clicks" Mobility  Outcome Measure Help needed turning from your back to your side while in a flat bed without using bedrails?: None Help needed moving from lying on your back to sitting on the side of a flat bed without using bedrails?: None Help needed moving to and from a bed to a chair (including a wheelchair)?: None Help needed standing up from a chair using your arms (e.g., wheelchair or bedside chair)?: None Help needed to  walk in hospital room?: None Help needed climbing 3-5 steps with a railing? : A Little 6 Click Score: 23    End of Session Equipment Utilized During Treatment: Gait belt;Other (comment)(sling for RUE) Activity Tolerance: Patient tolerated treatment well Patient left: in chair;with call bell/phone within reach   PT Visit Diagnosis: Pain Pain - Right/Left: Right Pain - part of body: Shoulder    Time: 1740-8144 PT Time Calculation (min) (ACUTE ONLY): 16 min   Charges:   PT Evaluation $PT Eval Low Complexity: [place], PT, DPT   Acute Rehabilitation Department [phone]  Otho Bellows 09/04/2019, 10:32 AM

## 2019-09-04 NOTE — Evaluation (Signed)
Occupational Therapy Evaluation Patient Details Name: Stephen [name] MRN: [id number] DOB: [birth year] Today's Date: 09/04/2019    History of Present Illness Pt is a 58 yo male s/p R reverese total shoulder arthroplasty. PMHx: L RTSA, arthritis.   Clinical Impression   Pt PTA: Pt with recent L RTSA in Feb 2020. Pt lives with family, but is home alone most of the day. Pt reports independence with mobility and ADL. Pt currently, able to recall RTSA precautions from previous surgery. Pt required very few cues for proper technique. Pt set-upA to donn shoulder immobilizer. Pt supervisionA to MinA for UB/LB ADL. Pt requires minA for tying shoes. Pt very stable with donning pants in standing. Exercises for Elbow, wrist and hand initiated and handout provided. Shoulder precautions went over thoroughly and pt given handout. Pt does not require continued OT skilled services acutely and follow shoulder MD for follow-up OT as appropriate. OT signing off.       Follow Up Recommendations  Follow surgeon's recommendation for DC plan and follow-up therapies;Supervision - Intermittent    Equipment Recommendations  None recommended by OT    Recommendations for Other Services       Precautions / Restrictions Precautions Precautions: Shoulder Type of Shoulder Precautions: reverse total shoulder Shoulder Interventions: Shoulder sling/immobilizer;At all times Precaution Booklet Issued: Yes (comment) Precaution Comments: verbally discussed Required Braces or Orthoses: Sling Restrictions Weight Bearing Restrictions: Yes RUE Weight Bearing: Non weight bearing      Mobility Bed Mobility Overal bed mobility: Modified Independent                Transfers Overall transfer level: Modified independent                    Balance Overall balance assessment: No apparent balance deficits (not formally assessed)                                         ADL either performed  or assessed with clinical judgement   ADL Overall ADL's : Needs assistance/impaired Eating/Feeding: Set up;Sitting   Grooming: Set up;Standing Grooming Details (indicate cue type and reason): assist to open toothbrush plastic Upper Body Bathing: Minimal assistance;Sitting   Lower Body Bathing: Set up;Supervison/ safety;Sitting/lateral leans;Sit to/from stand   Upper Body Dressing : Set up;Sitting;Standing   Lower Body Dressing: Minimal assistance;Sitting/lateral leans;Sit to/from stand Lower Body Dressing Details (indicate cue type and reason): minA to tie shoes Toilet Transfer: Modified Independent   Toileting- Clothing Manipulation and Hygiene: Modified independent Toileting - Clothing Manipulation Details (indicate cue type and reason): donned pants and belted pants, 1 handed buttoning     Functional mobility during ADLs: Modified independent General ADL Comments: Pt able to recall RTSA precautions from previous surgery. Pt required very few cues for proper technique. Pt set-upA to donn shoulder immobilizer.     Vision Baseline Vision/History: Wears glasses Wears Glasses: Reading only Vision Assessment?: No apparent visual deficits     Perception     Praxis      Pertinent Vitals/Pain Pain Assessment: 0-10 Pain Score: 8  Pain Location: R shoulder Pain Descriptors / Indicators: Discomfort Pain Intervention(s): Premedicated before session     Hand Dominance Right   Extremity/Trunk Assessment Upper Extremity Assessment Upper Extremity Assessment: RUE deficits/detail RUE Deficits / Details: s/p R RTSA, elbow wrist and digits, WFLs for AROM RUE: Unable to fully assess due to pain  RUE Coordination: decreased gross motor   Lower Extremity Assessment Lower Extremity Assessment: Overall WFL for tasks assessed   Cervical / Trunk Assessment Cervical / Trunk Assessment: Normal   Communication Communication Communication: No difficulties   Cognition Arousal/Alertness:  Awake/alert Behavior During Therapy: WFL for tasks assessed/performed Overall Cognitive Status: Within Functional Limits for tasks assessed                                     General Comments  Pt reports that he has family at home to assist as needed    Exercises Exercises: Other exercises Other Exercises Other Exercises: elbow, wrist and hand flex/ext  x10 reps.   Shoulder Instructions      Home Living Family/patient expects to be discharged to:: Private residence Living Arrangements: Other relatives Available Help at Discharge: Family;Available 24 hours/day Type of Home: House Home Access: Stairs to enter [employer] of Steps: 5   Home Layout: One level     Bathroom Shower/Tub: Teacher, early years/pre: Standard     Home Equipment: None   Additional Comments: lives with nephew who works during the day      Prior Functioning/Environment Level of Independence: Independent        Comments: reports limited by pain but independent ADLs, driving and IADLs        OT Problem List: Decreased activity tolerance;Pain;Impaired UE functional use      OT Treatment/Interventions:      OT Goals(Current goals can be found in the care plan section) Acute Rehab OT Goals Patient Stated Goal: to go home OT Goal Formulation: With patient  OT Frequency:     Barriers to D/C:            Co-evaluation              AM-PAC OT "6 Clicks" Daily Activity     Outcome Measure Help from another person eating meals?: None Help from another person taking care of personal grooming?: None Help from another person toileting, which includes using toliet, bedpan, or urinal?: A Little Help from another person bathing (including washing, rinsing, drying)?: A Little Help from another person to put on and taking off regular upper body clothing?: A Little Help from another person to put on and taking off regular lower body clothing?: A Little 6  Click Score: 20   End of Session Equipment Utilized During Treatment: Other (comment)(R shoulder immob) Nurse Communication: Mobility status  Activity Tolerance: Patient tolerated treatment well Patient left: in chair;with call bell/phone within reach  OT Visit Diagnosis: Unsteadiness on feet (R26.81);Muscle weakness (generalized) (M62.81)                Time: [id number] OT Time Calculation (min): 27 min Charges:  OT General Charges $OT Visit: 1 Visit OT Evaluation $OT Eval Moderate Complexity: 1 Mod OT Treatments $Self Care/Home Management : 8-22 mins  Ebony Hail Harold Hedge) Marsa Aris OTR/L Acute Rehabilitation Services Pager: [phone] Office: [place] 09/04/2019, 9:55 AM

## 2019-09-04 NOTE — Discharge Summary (Signed)
Physician Discharge Summary      Patient ID: Stephen [name] MRN: [id number] DOB/AGE: 60-11-1960 60 y.o.  Admit date: 09/03/2019 Discharge date: 09/04/2019  Admission Diagnoses:  Active Problems:   Rotator cuff arthropathy   Discharge Diagnoses:  Same  Surgeries: Procedure(s): RIGHT REVERSE SHOULDER ARTHROPLASTY on 09/03/2019   Consultants:   Discharged Condition: Stable  Hospital Course: Stephen [name] is an 58 y.o. male who was admitted 09/03/2019 with a chief complaint of right shoulder pain, and found to have a diagnosis of right shoulder rotator cuff arthropathy.  They were brought to the operating room on 09/03/2019 and underwent the above named procedures.  Pt awoke from anesthesia without complication and was transferred to the floor. On POD1, patient's pain was controlled and his block had worn off with all motor function of the right hand remaining intact. He mobilized well in PT and did well in OT. He was discharged on POD1.  Pt will f/u with Dr. Marlou Sa in clinic in ~2 weeks.   Antibiotics given:  Anti-infectives (From admission, onward)   Start     Dose/Rate Route Frequency Ordered Stop   09/03/19 1545  ceFAZolin (ANCEF) IVPB 2g/100 mL premix     2 g 200 mL/hr over 30 Minutes Intravenous Every 6 hours 09/03/19 1531 09/03/19 2155   09/03/19 0922  vancomycin (VANCOCIN) powder  Status:  Discontinued       As needed 09/03/19 0922 09/03/19 1018   09/03/19 0600  ceFAZolin (ANCEF) IVPB 2g/100 mL premix     2 g 200 mL/hr over 30 Minutes Intravenous On call to O.R. 09/03/19 0545 09/03/19 0730    .  Recent vital signs:  Vitals:   09/04/19 0530 09/04/19 0752  BP: 130/82 135/85  Pulse: 74 74  Resp: 14 16  Temp: 98.2 F (36.8 C) 97.6 F (36.4 C)  SpO2: 100% 100%    Recent laboratory studies:  Results for orders placed or performed during the hospital encounter of 08/30/19  Novel Coronavirus, NAA (Hosp order, Send-out to Ref Lab; TAT 18-24 hrs   Specimen:  Nasopharyngeal Swab; Respiratory  Result Value Ref Range   SARS-CoV-2, NAA NOT DETECTED NOT DETECTED   Coronavirus Source NASOPHARYNGEAL     Discharge Medications:   Allergies as of 09/04/2019   No Known Allergies     Medication List    STOP taking these medications   acetaminophen-codeine 300-30 MG tablet Commonly known as: TYLENOL #3     TAKE these medications   aspirin EC 81 MG tablet Take 1 tablet (81 mg total) by mouth daily.   diclofenac sodium 1 % Gel Commonly known as: VOLTAREN Apply 2 g topically 4 (four) times daily.   meloxicam 7.5 MG tablet Commonly known as: Mobic Take 1 tablet (7.5 mg total) by mouth daily.   methocarbamol 500 MG tablet Commonly known as: Robaxin Take 1 tablet (500 mg total) by mouth every 8 (eight) hours as needed for muscle spasms.   multivitamin with minerals Tabs tablet Take 1 tablet by mouth daily.   oxyCODONE 5 MG immediate release tablet Commonly known as: Oxy IR/ROXICODONE Take 1 tablet (5 mg total) by mouth every 4 (four) hours as needed for moderate pain (pain score 4-6).   oxymetazoline 0.05 % nasal spray Commonly known as: AFRIN Place 1 spray into both nostrils 2 (two) times daily as needed for congestion.   pantoprazole 40 MG tablet Commonly known as: Protonix Take 1 tablet (40 mg total) by mouth daily.  Diagnostic Studies: Dg Shoulder Right Port  Result Date: 09/03/2019 CLINICAL DATA:  Post RIGHT shoulder arthroplasty EXAM: PORTABLE RIGHT SHOULDER COMPARISON:  CT RIGHT shoulder 09/14/2018 FINDINGS: Osseous demineralization. Degenerative changes of the RIGHT AC joint. Components of a reverse RIGHT shoulder arthroplasty are identified. No fracture, dislocation or bone destruction identified on single AP view. Chronic RIGHT upper lobe scarring noted. IMPRESSION: RIGHT shoulder prosthesis without acute complication identified on single AP view. Osseous demineralization with degenerative changes RIGHT AC joint.  Electronically Signed   By: Lavonia Dana M.D.   On: 09/03/2019 11:11    Disposition: Discharge disposition: 01-Home or Self Care       Discharge Instructions    Call MD / Call 911   Complete by: As directed    If you experience chest pain or shortness of breath, CALL 911 and be transported to the hospital emergency room.  If you develope a fever above 101 F, pus (white drainage) or increased drainage or redness at the wound, or calf pain, call your surgeon's office.   Constipation Prevention   Complete by: As directed    Drink plenty of fluids.  Prune juice may be helpful.  You may use a stool softener, such as Colace (over the counter) 100 mg twice a day.  Use MiraLax (over the counter) for constipation as needed.   Diet - low sodium heart healthy   Complete by: As directed    Discharge instructions   Complete by: As directed    You may shower, dressing is waterproof.  Do not bathe or soak the operative shoulder in a tub, pool.  Use the CPM machine 3 times a day for one hour each time.  I reached out to [place] to let her know you do not have a CPM machine yet, someone should contact you in the next day or so.  If not, reach out to our office.  No lifting with the operative shoulder. Continue use of the sling.  Follow-up with Dr. Marlou Sa in ~2 weeks on your given appointment date.  We will remove your adhesive bandage at that time.   Increase activity slowly as tolerated   Complete by: As directed          Signed: Donella Stade 09/04/2019, 2:22 PM

## 2019-09-04 NOTE — Plan of Care (Signed)

## 2019-09-04 NOTE — Progress Notes (Signed)
Stephen [name] to be D/C'd home per MD order.  Discussed prescriptions and follow up appointments with the patient. Prescriptions sent to pt's preferred pharmacy, medication list explained in detail. Pt verbalized understanding.  Allergies as of 09/04/2019   No Known Allergies     Medication List    STOP taking these medications   acetaminophen-codeine 300-30 MG tablet Commonly known as: TYLENOL #3     TAKE these medications   aspirin EC 81 MG tablet Take 1 tablet (81 mg total) by mouth daily.   diclofenac sodium 1 % Gel Commonly known as: VOLTAREN Apply 2 g topically 4 (four) times daily.   meloxicam 7.5 MG tablet Commonly known as: Mobic Take 1 tablet (7.5 mg total) by mouth daily.   methocarbamol 500 MG tablet Commonly known as: Robaxin Take 1 tablet (500 mg total) by mouth every 8 (eight) hours as needed for muscle spasms.   multivitamin with minerals Tabs tablet Take 1 tablet by mouth daily.   oxyCODONE 5 MG immediate release tablet Commonly known as: Oxy IR/ROXICODONE Take 1 tablet (5 mg total) by mouth every 4 (four) hours as needed for moderate pain (pain score 4-6).   oxymetazoline 0.05 % nasal spray Commonly known as: AFRIN Place 1 spray into both nostrils 2 (two) times daily as needed for congestion.   pantoprazole 40 MG tablet Commonly known as: Protonix Take 1 tablet (40 mg total) by mouth daily.       Vitals:   09/04/19 0530 09/04/19 0752  BP: 130/82 135/85  Pulse: 74 74  Resp: 14 16  Temp: 98.2 F (36.8 C) 97.6 F (36.4 C)  SpO2: 100% 100%    Skin clean, dry and intact without evidence of skin break down, aside from surgical incision on right shoulder. IV catheter discontinued intact. Site without signs and symptoms of complications. Dressing and pressure applied. Pt medicated for right shoulder pain prior to discharge. No complaints noted.  An After Visit Summary was printed and given to the patient. Patient escorted via [place], and D/C home  via private auto.  Stephen [name] 09/04/2019 12:11 PM

## 2019-09-08 ENCOUNTER — Other Ambulatory Visit: Payer: Self-pay | Admitting: Surgical

## 2019-09-12 ENCOUNTER — Telehealth: Payer: Self-pay | Admitting: Orthopedic Surgery

## 2019-09-12 ENCOUNTER — Telehealth: Payer: Self-pay | Admitting: Internal Medicine

## 2019-09-12 ENCOUNTER — Other Ambulatory Visit: Payer: Self-pay | Admitting: Surgical

## 2019-09-12 DIAGNOSIS — M19019 Primary osteoarthritis, unspecified shoulder: Secondary | ICD-10-CM

## 2019-09-12 DIAGNOSIS — M25511 Pain in right shoulder: Secondary | ICD-10-CM

## 2019-09-12 DIAGNOSIS — G8929 Other chronic pain: Secondary | ICD-10-CM

## 2019-09-12 MED ORDER — MELOXICAM 7.5 MG PO TABS: 7.5000 mg | ORAL_TABLET | Freq: Every day | ORAL | 2 refills | Status: DC

## 2019-09-12 MED ORDER — OXYCODONE HCL 5 MG PO TABS: 5.0000 mg | ORAL_TABLET | Freq: Four times a day (QID) | ORAL | 0 refills | Status: DC | PRN

## 2019-09-12 NOTE — Telephone Encounter (Signed)
Pt needs a refill on oxyCODONE (OXY IR/ROXICODONE) 5 MG immediate release tablet  ;pt contact [phone]   [id number] - [place], [place] - [address]

## 2019-09-12 NOTE — Telephone Encounter (Signed)
Please advise. Thanks.  

## 2019-09-12 NOTE — Telephone Encounter (Signed)
Patient called needing Rx refilled (Oxycodone and Meloxicam) the number to contact patient is [phone]

## 2019-09-12 NOTE — Telephone Encounter (Signed)
Called pt to see if he had his surgery; stated he did - informed to call the surgeon's office for pain medication. Stated ok.

## 2019-09-18 ENCOUNTER — Other Ambulatory Visit: Payer: Self-pay

## 2019-09-18 ENCOUNTER — Encounter: Payer: Self-pay | Admitting: Orthopedic Surgery

## 2019-09-18 ENCOUNTER — Ambulatory Visit (INDEPENDENT_AMBULATORY_CARE_PROVIDER_SITE_OTHER): Payer: Non-veteran care | Admitting: Orthopedic Surgery

## 2019-09-18 ENCOUNTER — Ambulatory Visit (INDEPENDENT_AMBULATORY_CARE_PROVIDER_SITE_OTHER): Payer: No Typology Code available for payment source

## 2019-09-18 DIAGNOSIS — Z96611 Presence of right artificial shoulder joint: Secondary | ICD-10-CM

## 2019-09-18 MED ORDER — OXYCODONE HCL 5 MG PO TABS: 5.0000 mg | ORAL_TABLET | Freq: Three times a day (TID) | ORAL | 0 refills | Status: DC | PRN

## 2019-09-18 MED ORDER — DICLOFENAC SODIUM 50 MG PO TBEC: 50.0000 mg | DELAYED_RELEASE_TABLET | Freq: Two times a day (BID) | ORAL | 0 refills | Status: DC

## 2019-09-20 ENCOUNTER — Encounter: Payer: Self-pay | Admitting: Orthopedic Surgery

## 2019-09-20 NOTE — Progress Notes (Signed)
Post-Op Visit Note   Patient: Stephen [name]           Date of Birth: 11/13/1960           MRN: [id number] Visit Date: 09/18/2019 PCP: Earlene Plater, MD   Assessment & Plan:  Chief Complaint:  Chief Complaint  Patient presents with  . Follow-up   Visit Diagnoses:  1. Status post reverse total replacement of right shoulder     Plan: Patient is a 58 year old male who presents s/p right reverse shoulder arthroplasty on 09/03/2019.  Patient has been remaining in sling since surgery.  He is using the CPM machine as instructed.  He has excellent range of motion with 80 degrees of forward flexion 90 degrees of abduction and 35 degrees of external rotation.  His incision is healing well.  He is taking 2 pills of 5 mg oxycodone every 6 hours as needed.  Recommended that patient only take at most 1 oxycodone pill every 6 hours at this point.  Plan to discontinue the sling.  Prescribed Voltaren to assist with patient's pain management.  X-rays of the right shoulder taken today reveal a well aligned prosthesis without evidence of periprosthetic fracture, dislocation, loosening.  Patient will begin outpatient physical therapy to work on right shoulder range of motion and strengthening.  Refilled pain medication.  Patient will follow-up in 4 weeks.  Follow-Up Instructions: No follow-ups on file.   Orders:  Orders Placed This Encounter  Procedures  . XR Shoulder Right  . Ambulatory referral to Physical Therapy   Meds ordered this encounter  Medications  . oxyCODONE (OXY IR/ROXICODONE) 5 MG immediate release tablet    Sig: Take 1 tablet (5 mg total) by mouth every 8 (eight) hours as needed for moderate pain (pain score 4-6).    Dispense:  35 tablet    Refill:  0  . diclofenac (VOLTAREN) 50 MG EC tablet    Sig: Take 1 tablet (50 mg total) by mouth 2 (two) times daily.    Dispense:  60 tablet    Refill:  0    Imaging: No results found.  PMFS History: Patient Active Problem List   Diagnosis Date Noted  . Rotator cuff arthropathy 09/03/2019  . Hypokalemia 05/12/2019  . Rotator cuff arthropathy of left shoulder   . Shoulder arthritis 12/11/2018  . Hepatitis C antibody test positive 01/19/2018  . Chronic pain of both shoulders 01/17/2018  . Screening for colon cancer 01/17/2018  . Sinus congestion 01/17/2018  . Malnutrition of moderate degree 01/09/2018  . Chronic pancreatitis ([place]) 01/06/2018  . Hyponatremia 01/06/2018   Past Medical History:  Diagnosis Date  . Arthritis   . GERD (gastroesophageal reflux disease)   . Pancreatitis   . Tuberculosis    yrs. ago pt. states he was dx. here at [facility]    History reviewed. No pertinent family history.  Past Surgical History:  Procedure Laterality Date  . FRACTURE SURGERY     jaw  . REVERSE SHOULDER ARTHROPLASTY Left 12/11/2018   Procedure: LEFT REVERSE SHOULDER ARTHROPLASTY;  Surgeon: Meredith Pel, MD;  Location: [place];  Service: Orthopedics;  Laterality: Left;  . REVERSE SHOULDER ARTHROPLASTY Right 09/03/2019  . REVERSE SHOULDER ARTHROPLASTY Right 09/03/2019   Procedure: RIGHT REVERSE SHOULDER ARTHROPLASTY;  Surgeon: Meredith Pel, MD;  Location: [place];  Service: Orthopedics;  Laterality: Right;   Social History   Occupational History  . Not on file  Tobacco Use  . Smoking status: Current Every Day  Smoker    Years: 40.00    Types: Cigarettes  . Smokeless tobacco: Never Used  . Tobacco comment: 1 cigarette per day  Substance and Sexual Activity  . Alcohol use: Yes    Comment: 1-2 beers daily.   . Drug use: Yes    Types: Marijuana    Comment: 1 joint every other day  . Sexual activity: Not on file

## 2019-09-25 ENCOUNTER — Ambulatory Visit: Payer: Non-veteran care | Admitting: Physical Therapy

## 2019-10-01 ENCOUNTER — Other Ambulatory Visit: Payer: Self-pay

## 2019-10-01 ENCOUNTER — Ambulatory Visit (INDEPENDENT_AMBULATORY_CARE_PROVIDER_SITE_OTHER): Payer: No Typology Code available for payment source | Admitting: Physical Therapy

## 2019-10-01 ENCOUNTER — Encounter: Payer: Self-pay | Admitting: Physical Therapy

## 2019-10-01 DIAGNOSIS — R293 Abnormal posture: Secondary | ICD-10-CM | POA: Diagnosis not present

## 2019-10-01 DIAGNOSIS — M25611 Stiffness of right shoulder, not elsewhere classified: Secondary | ICD-10-CM | POA: Diagnosis not present

## 2019-10-01 DIAGNOSIS — M25511 Pain in right shoulder: Secondary | ICD-10-CM | POA: Diagnosis not present

## 2019-10-01 DIAGNOSIS — M6281 Muscle weakness (generalized): Secondary | ICD-10-CM | POA: Diagnosis not present

## 2019-10-01 DIAGNOSIS — R600 Localized edema: Secondary | ICD-10-CM

## 2019-10-01 NOTE — Patient Instructions (Signed)
Access Code: [email or web address]  URL: [email or web address]  Date: 10/01/2019  Prepared by: Faustino Congress   Exercises  Supine Shoulder Flexion with Dowel - 10 reps - 1 sets - 1-2 sec hold - 2x daily - 7x weekly  Supine Shoulder External Rotation with Dowel - 10 reps - 1 sets - 1-2 sec hold - 2x daily - 7x weekly  Supine Shoulder Abduction AAROM with Dowel - 10 reps - 1 sets - 1-2 sec hold - 2x daily - 7x weekly  Seated Scapular Retraction - 10 reps - 1 sets - 5 sec hold - 2x daily - 7x weekly

## 2019-10-01 NOTE — Therapy (Signed)
[facility] [address] [place], [place], [address] Phone: [phone]   Fax:  [phone]  Physical Therapy Evaluation  Patient Details  Name: Stephen [name] MRN: [id number] Date of Birth: September 02, 1961 Referring Provider (PT): Donella Stade, [place]   Encounter Date: 10/01/2019  PT End of Session - 10/01/19 1235    Visit Number  1    Number of Visits  15    Date for PT Re-Evaluation  11/05/19    Authorization Time Period  11/24-12/29    Authorization - Number of Visits  15    PT Start Time  1020   pt arrived late and requested to leave early   PT Stop Time  1040    PT Time Calculation (min)  20 min    Activity Tolerance  Patient tolerated treatment well;Patient limited by pain    Behavior During Therapy  [facility] for tasks assessed/performed       Past Medical History:  Diagnosis Date  . Arthritis   . GERD (gastroesophageal reflux disease)   . Pancreatitis   . Tuberculosis    yrs. ago pt. states he was dx. here at [facility]    Past Surgical History:  Procedure Laterality Date  . FRACTURE SURGERY     jaw  . REVERSE SHOULDER ARTHROPLASTY Left 12/11/2018   Procedure: LEFT REVERSE SHOULDER ARTHROPLASTY;  Surgeon: Meredith Pel, MD;  Location: [place];  Service: Orthopedics;  Laterality: Left;  . REVERSE SHOULDER ARTHROPLASTY Right 09/03/2019  . REVERSE SHOULDER ARTHROPLASTY Right 09/03/2019   Procedure: RIGHT REVERSE SHOULDER ARTHROPLASTY;  Surgeon: Meredith Pel, MD;  Location: [place];  Service: Orthopedics;  Laterality: Right;    There were no vitals filed for this visit.   Subjective Assessment - 10/01/19 1022    Subjective  Pt is a 58 y/o male who presents to OPPT s/p Rt reverse total shoulder on 09/03/2019.  Pt reports using CPM machine and presents today with c/o increased pain and aching.  Pt states he feels ROM is slowly improving.    Patient Stated Goals  improve strength and movement    Currently in Pain?  Yes    Pain  Score  7    up to 8/10; at best 7/10   Pain Location  Shoulder    Pain Orientation  Right    Pain Descriptors / Indicators  Aching    Pain Type  Acute pain;Surgical pain    Pain Onset  More than a month ago    Pain Frequency  Constant    Aggravating Factors   movement    Pain Relieving Factors  meds         [facility] PT Assessment - 10/01/19 1024      Assessment   Medical Diagnosis  Rt reverse total shoulder    Referring Provider (PT)  Magnant, Gerrianne Scale, PA-C    Onset Date/Surgical Date  09/03/19    Hand Dominance  Right    Next MD Visit  10/16/2019    Prior Therapy  has CPM machine      Precautions   Precaution Comments  no lifting > 5#      Restrictions   Weight Bearing Restrictions  No      Balance Screen   Has the patient fallen in the past 6 months  No    Has the patient had a decrease in activity level because of a fear of falling?   No    Is the patient reluctant to  leave their home because of a fear of falling?   No      Home Environment   Living Environment  Private residence    Living Arrangements  Other relatives   nephew     Prior Function   Level of Independence  Independent    Vocation  Retired;On disability    Leisure  walking, go to the park      Cognition   Overall Cognitive Status  Within Functional Limits for tasks assessed      Posture/Postural Control   Posture/Postural Control  Postural limitations    Postural Limitations  Rounded Shoulders;Forward head      ROM / Strength   AROM / PROM / Strength  AROM;PROM;Strength      AROM   Overall AROM Comments  pain with Lt and Rt shoulder AROM; all tested in supine    AROM Assessment Site  Shoulder    Right/Left Shoulder  Right;Left    Right Shoulder Flexion  52 Degrees    Right Shoulder ABduction  75 Degrees    Right Shoulder Internal Rotation  52 Degrees    Left Shoulder Flexion  120 Degrees    Left Shoulder ABduction  135 Degrees    Left Shoulder Internal Rotation  82 Degrees    Left  Shoulder External Rotation  52 Degrees      PROM   Overall PROM Comments  all tested in supine    PROM Assessment Site  Shoulder    Right/Left Shoulder  Right    Right Shoulder Flexion  120 Degrees    Right Shoulder ABduction  96 Degrees    Right Shoulder Internal Rotation  68 Degrees    Right Shoulder External Rotation  50 Degrees      Strength   Overall Strength Comments  deferred; grossly 2/5 Rt shoulder                Objective measurements completed on examination: See above findings.      [facility] Adult PT Treatment/Exercise - 10/01/19 1024      Exercises   Exercises  Shoulder      Shoulder Exercises: Supine   External Rotation  AAROM;Right;5 reps   cane   Flexion  AAROM;Both;5 reps   cane   ABduction  AAROM;Right;5 reps   cane     Shoulder Exercises: Seated   Retraction  Both;5 reps   5 sec hold            PT Education - 10/01/19 1235    Education Details  HEP    Person(s) Educated  Patient    Methods  Explanation;Demonstration;Handout    Comprehension  Verbalized understanding;Returned demonstration;Need further instruction       PT Short Term Goals - 10/01/19 1241      PT SHORT TERM GOAL #1   Title  independent with initial HEP    Status  New    Target Date  10/29/19      PT SHORT TERM GOAL #2   Title  improve Rt shoulder AROM flexion and abduction by 20 deg for improved function and strength    Status  New    Target Date  10/29/19        PT Long Term Goals - 10/01/19 1242      PT LONG TERM GOAL #1   Title  independent with advanced HEP    Status  New    Target Date  11/26/19      PT LONG  TERM GOAL #2   Title  Rt shoulder AROM WFL for improved function    Status  New    Target Date  11/26/19      PT LONG TERM GOAL #3   Title  report pain < 4/10 with activity for improved function    Status  New    Target Date  11/26/19      PT LONG TERM GOAL #4   Title  demonstrate at least 4/5 Rt shoulder flexion and abduction  strength for improved function    Status  New    Target Date  11/26/19             Plan - 10/01/19 1236    Clinical Impression Statement  Pt is a 58 y/o male who presents to OPPT s/p Rt reverse total shoulder on 09/03/19.  Pt demonstrates continued pain and swelling post-op and decreased ROM and strength affecting functional mobility.  Pt will benefit from PT to address deficits listed.    Personal Factors and Comorbidities  Other;Comorbidity 3+    Comorbidities  HEP C, pancreatitis, malnutrition, bil reverse TSA    Examination-Activity Limitations  Bathing;Lift;Bed Mobility;Toileting;Reach Overhead;Carry;Dressing;Hygiene/Grooming    Examination-Participation Restrictions  Cleaning    Stability/Clinical Decision Making  Evolving/Moderate complexity    Clinical Decision Making  Moderate    Rehab Potential  Good    PT Frequency  2x / week    PT Duration  8 weeks   visits approved through 12/29   PT Treatment/Interventions  ADLs/Self Care Home Management;Cryotherapy;Electrical Stimulation;Moist Heat;Therapeutic activities;Therapeutic exercise;Patient/family education;Neuromuscular re-education;Manual techniques;Vasopneumatic Device;Taping;Passive range of motion    PT Next Visit Plan  review HEP, progress ROM and gentle strengthening    PT Home Exercise Plan  Access Code: [email or web address]    Consulted and Agree with Plan of Care  Patient       Patient will benefit from skilled therapeutic intervention in order to improve the following deficits and impairments:  Increased fascial restricitons, Pain, Postural dysfunction, Increased muscle spasms, Decreased mobility, Decreased strength, Decreased range of motion, Impaired UE functional use, Increased edema  Visit Diagnosis: Acute pain of right shoulder - Plan: PT plan of care cert/re-cert  Stiffness of right shoulder, not elsewhere classified - Plan: PT plan of care cert/re-cert  Abnormal posture - Plan: PT plan of care cert/re-cert  Muscle  weakness (generalized) - Plan: PT plan of care cert/re-cert  Localized edema - Plan: PT plan of care cert/re-cert     Problem List Patient Active Problem List   Diagnosis Date Noted  . Rotator cuff arthropathy 09/03/2019  . Hypokalemia 05/12/2019  . Rotator cuff arthropathy of left shoulder   . Shoulder arthritis 12/11/2018  . Hepatitis C antibody test positive 01/19/2018  . Chronic pain of both shoulders 01/17/2018  . Screening for colon cancer 01/17/2018  . Sinus congestion 01/17/2018  . Malnutrition of moderate degree 01/09/2018  . Chronic pancreatitis ([place]) 01/06/2018  . Hyponatremia 01/06/2018      Laureen Abrahams, PT, DPT 10/01/19 12:46 PM     [facility] [address] [place], [place], [address] Phone: [phone]   Fax:  [phone]  Name: ARSHAWN NORRED MRN: [id number] Date of Birth: Dec 02, 1960

## 2019-10-14 ENCOUNTER — Other Ambulatory Visit: Payer: Self-pay

## 2019-10-14 ENCOUNTER — Ambulatory Visit (INDEPENDENT_AMBULATORY_CARE_PROVIDER_SITE_OTHER): Payer: No Typology Code available for payment source | Admitting: Physical Therapy

## 2019-10-14 DIAGNOSIS — R293 Abnormal posture: Secondary | ICD-10-CM | POA: Diagnosis not present

## 2019-10-14 DIAGNOSIS — M6281 Muscle weakness (generalized): Secondary | ICD-10-CM

## 2019-10-14 DIAGNOSIS — M25611 Stiffness of right shoulder, not elsewhere classified: Secondary | ICD-10-CM

## 2019-10-14 DIAGNOSIS — M25511 Pain in right shoulder: Secondary | ICD-10-CM

## 2019-10-14 DIAGNOSIS — R600 Localized edema: Secondary | ICD-10-CM

## 2019-10-14 NOTE — Therapy (Addendum)
[facility] [address] [place], [place], [address] Phone: [phone]   Fax:  [phone]  Physical Therapy Treatment/Discharge addendum PHYSICAL THERAPY DISCHARGE SUMMARY  Visits from Start of Care: 2  Current functional level related to goals / functional outcomes: See below   Remaining deficits: See below   Education / Equipment: HEP Plan: Patient agrees to discharge.  Patient goals were not met. Patient is being discharged due to not returning since the last visit.  ?????  Elsie Ra, PT, DPT 01/14/20 1:35 PM      Patient Details  Name: Stephen [name] MRN: [id number] Date of Birth: 1961/05/30 Referring Provider (PT): Donella Stade, [place]   Encounter Date: 10/14/2019  PT End of Session - 10/14/19 1059    Visit Number  2    Number of Visits  15    Date for PT Re-Evaluation  11/05/19    Authorization Time Period  11/24-12/29    Authorization - Visit Number  2    Authorization - Number of Visits  15    PT Start Time  3338    PT Stop Time  1023   pt left after 8  minutes into session, due to agitation and "more important things to do"   PT Time Calculation (min)  8 min    Activity Tolerance  Patient limited by pain;Treatment limited secondary to agitation    Behavior During Therapy  Agitated       Past Medical History:  Diagnosis Date  . Arthritis   . GERD (gastroesophageal reflux disease)   . Pancreatitis   . Tuberculosis    yrs. ago pt. states he was dx. here at [facility]    Past Surgical History:  Procedure Laterality Date  . FRACTURE SURGERY     jaw  . REVERSE SHOULDER ARTHROPLASTY Left 12/11/2018   Procedure: LEFT REVERSE SHOULDER ARTHROPLASTY;  Surgeon: Meredith Pel, MD;  Location: [place];  Service: Orthopedics;  Laterality: Left;  . REVERSE SHOULDER ARTHROPLASTY Right 09/03/2019  . REVERSE SHOULDER ARTHROPLASTY Right 09/03/2019   Procedure: RIGHT REVERSE SHOULDER ARTHROPLASTY;  Surgeon: Meredith Pel, MD;  Location: [place];  Service: Orthopedics;  Laterality: Right;    There were no vitals filed for this visit.  Subjective Assessment - 10/14/19 1056    Subjective  relays pain is always 7/10 in right shoulder and that he needs pain meds. He was encouraged to ask his MD about this at next F/U appointment on 10/16/19. He says he needs to leave from todays session ASAP as he has more important things to do.                       [place] Adult PT Treatment/Exercise - 10/14/19 0001      Exercises   Exercises  Shoulder      Shoulder Exercises: Seated   Retraction  10 reps      Shoulder Exercises: Pulleys   Flexion  2 minutes    Flexion Limitations  cues to stay in painfree ROM, was more compliant in this plane    ABduction  2 minutes    ABduction Limitations  cues to stay in pain free ROM, noncompliant with this      Shoulder Exercises: ROM/Strengthening   UBE (Upper Arm Bike)  performed 1 min fwd no resistance but then discontinued due to pain               PT Short Term Goals -  10/01/19 1241      PT SHORT TERM GOAL #1   Title  independent with initial HEP    Status  New    Target Date  10/29/19      PT SHORT TERM GOAL #2   Title  improve Rt shoulder AROM flexion and abduction by 20 deg for improved function and strength    Status  New    Target Date  10/29/19        PT Long Term Goals - 10/01/19 1242      PT LONG TERM GOAL #1   Title  independent with advanced HEP    Status  New    Target Date  11/26/19      PT LONG TERM GOAL #2   Title  Rt shoulder AROM WFL for improved function    Status  New    Target Date  11/26/19      PT LONG TERM GOAL #3   Title  report pain < 4/10 with activity for improved function    Status  New    Target Date  11/26/19      PT LONG TERM GOAL #4   Title  demonstrate at least 4/5 Rt shoulder flexion and abduction strength for improved function    Status  New    Target Date  11/26/19             Plan - 10/14/19 1101    Clinical Impression Statement  Pt only stayed for 8 min then leaving due to agitation and "having more important things to do". During the short time he was here today he constantly moved into painful ranges then complained his shoulder hurt too much. After telling him to stay in pain free ROM and to perform stretching in a slower more gentle mannor be became agitated. He then was educated that pushing his shoulder too far into pain will not make him recover faster and will only flare things up keeping him at high pain levels. He then became more agitated and states "I am done with this today, I got more important things to do". He then walked out of session. He will follow up with MD on wednesday, no measurements taken today as he declined further treatment or assessment.    Personal Factors and Comorbidities  Other;Comorbidity 3+    Comorbidities  HEP C, pancreatitis, malnutrition, bil reverse TSA    Examination-Activity Limitations  Bathing;Lift;Bed Mobility;Toileting;Reach Overhead;Carry;Dressing;Hygiene/Grooming    Examination-Participation Restrictions  Cleaning    Stability/Clinical Decision Making  Evolving/Moderate complexity    Rehab Potential  Good    PT Frequency  2x / week    PT Duration  8 weeks   visits approved through 12/29   PT Treatment/Interventions  ADLs/Self Care Home Management;Cryotherapy;Electrical Stimulation;Moist Heat;Therapeutic activities;Therapeutic exercise;Patient/family education;Neuromuscular re-education;Manual techniques;Vasopneumatic Device;Taping;Passive range of motion    PT Next Visit Plan  review HEP, progress ROM and gentle strengthening    PT Home Exercise Plan  Access Code: [email or web address]    Consulted and Agree with Plan of Care  Patient       Patient will benefit from skilled therapeutic intervention in order to improve the following deficits and impairments:  Increased fascial restricitons, Pain, Postural dysfunction,  Increased muscle spasms, Decreased mobility, Decreased strength, Decreased range of motion, Impaired UE functional use, Increased edema  Visit Diagnosis: Acute pain of right shoulder  Stiffness of right shoulder, not elsewhere classified  Abnormal posture  Muscle weakness (generalized)  Localized edema  Problem List Patient Active Problem List   Diagnosis Date Noted  . Rotator cuff arthropathy 09/03/2019  . Hypokalemia 05/12/2019  . Rotator cuff arthropathy of left shoulder   . Shoulder arthritis 12/11/2018  . Hepatitis C antibody test positive 01/19/2018  . Chronic pain of both shoulders 01/17/2018  . Screening for colon cancer 01/17/2018  . Sinus congestion 01/17/2018  . Malnutrition of moderate degree 01/09/2018  . Chronic pancreatitis ([place]) 01/06/2018  . Hyponatremia 01/06/2018    Debbe Odea, PT,DPT 10/14/2019, 11:06 AM  [facility] [address] [place], [place], [address] Phone: [phone]   Fax:  [phone]  Name: Stephen [name] MRN: [id number] Date of Birth: [birth year]

## 2019-10-16 ENCOUNTER — Ambulatory Visit (INDEPENDENT_AMBULATORY_CARE_PROVIDER_SITE_OTHER): Payer: No Typology Code available for payment source | Admitting: Orthopedic Surgery

## 2019-10-16 ENCOUNTER — Other Ambulatory Visit: Payer: Self-pay

## 2019-10-16 DIAGNOSIS — Z96611 Presence of right artificial shoulder joint: Secondary | ICD-10-CM

## 2019-10-16 MED ORDER — HYDROCODONE-ACETAMINOPHEN 5-325 MG PO TABS: 1.0000 | ORAL_TABLET | Freq: Four times a day (QID) | ORAL | 0 refills | Status: DC | PRN

## 2019-10-16 MED ORDER — METHOCARBAMOL 500 MG PO TABS: 500.0000 mg | ORAL_TABLET | Freq: Three times a day (TID) | ORAL | 0 refills | Status: DC | PRN

## 2019-10-18 ENCOUNTER — Encounter: Payer: No Typology Code available for payment source | Admitting: Physical Therapy

## 2019-10-19 ENCOUNTER — Encounter: Payer: Self-pay | Admitting: Orthopedic Surgery

## 2019-10-19 NOTE — Progress Notes (Signed)
   Post-Op Visit Note   Patient: Stephen [name]           Date of Birth: 08/04/1961           MRN: [id number] Visit Date: 10/16/2019 PCP: Earlene Plater, MD   Assessment & Plan:  Chief Complaint:  Chief Complaint  Patient presents with  . Right Shoulder - Follow-up  . Left Shoulder - Follow-up   Visit Diagnoses: No diagnosis found.  Plan: [place] is now about 6 weeks out right reverse shoulder replacement.  Doing reasonably well.  Has some occasional popping in the shoulder on exam he has improving forward flexion abduction and just over 90 degrees.  Incision is intact.  Refill Norco refill Robaxin 6-week return for final check continue going to physical therapy.  Follow-Up Instructions: Return in about 6 weeks (around 11/27/2019).   Orders:  No orders of the defined types were placed in this encounter.  Meds ordered this encounter  Medications  . methocarbamol (ROBAXIN) 500 MG tablet    Sig: Take 1 tablet (500 mg total) by mouth every 8 (eight) hours as needed for muscle spasms.    Dispense:  30 tablet    Refill:  0  . HYDROcodone-acetaminophen (NORCO/VICODIN) 5-325 MG tablet    Sig: Take 1 tablet by mouth every 6 (six) hours as needed for moderate pain.    Dispense:  30 tablet    Refill:  0    Imaging: No results found.  PMFS History: Patient Active Problem List   Diagnosis Date Noted  . Rotator cuff arthropathy 09/03/2019  . Hypokalemia 05/12/2019  . Rotator cuff arthropathy of left shoulder   . Shoulder arthritis 12/11/2018  . Hepatitis C antibody test positive 01/19/2018  . Chronic pain of both shoulders 01/17/2018  . Screening for colon cancer 01/17/2018  . Sinus congestion 01/17/2018  . Malnutrition of moderate degree 01/09/2018  . Chronic pancreatitis ([place]) 01/06/2018  . Hyponatremia 01/06/2018   Past Medical History:  Diagnosis Date  . Arthritis   . GERD (gastroesophageal reflux disease)   . Pancreatitis   . Tuberculosis    yrs. ago pt. states he  was dx. here at [facility]    History reviewed. No pertinent family history.  Past Surgical History:  Procedure Laterality Date  . FRACTURE SURGERY     jaw  . REVERSE SHOULDER ARTHROPLASTY Left 12/11/2018   Procedure: LEFT REVERSE SHOULDER ARTHROPLASTY;  Surgeon: Meredith Pel, MD;  Location: [place];  Service: Orthopedics;  Laterality: Left;  . REVERSE SHOULDER ARTHROPLASTY Right 09/03/2019  . REVERSE SHOULDER ARTHROPLASTY Right 09/03/2019   Procedure: RIGHT REVERSE SHOULDER ARTHROPLASTY;  Surgeon: Meredith Pel, MD;  Location: [place];  Service: Orthopedics;  Laterality: Right;   Social History   Occupational History  . Not on file  Tobacco Use  . Smoking status: Current Every Day Smoker    Years: 40.00    Types: Cigarettes  . Smokeless tobacco: Never Used  . Tobacco comment: 1 cigarette per day  Substance and Sexual Activity  . Alcohol use: Yes    Comment: 1-2 beers daily.   . Drug use: Yes    Types: Marijuana    Comment: 1 joint every other day  . Sexual activity: Not on file

## 2019-10-22 ENCOUNTER — Encounter: Payer: No Typology Code available for payment source | Admitting: Physical Therapy

## 2019-10-24 ENCOUNTER — Encounter: Payer: No Typology Code available for payment source | Admitting: Physical Therapy

## 2019-10-24 ENCOUNTER — Other Ambulatory Visit: Payer: Self-pay | Admitting: Surgical

## 2019-10-24 NOTE — Telephone Encounter (Signed)
Gd pt

## 2019-10-24 NOTE — Telephone Encounter (Signed)
Please advise. Thanks.  

## 2019-10-25 ENCOUNTER — Other Ambulatory Visit: Payer: Self-pay | Admitting: Internal Medicine

## 2019-10-25 DIAGNOSIS — G8929 Other chronic pain: Secondary | ICD-10-CM

## 2019-10-25 DIAGNOSIS — M25511 Pain in right shoulder: Secondary | ICD-10-CM

## 2019-10-29 ENCOUNTER — Encounter: Payer: No Typology Code available for payment source | Admitting: Physical Therapy

## 2019-11-05 ENCOUNTER — Encounter: Payer: No Typology Code available for payment source | Admitting: Physical Therapy

## 2019-11-07 ENCOUNTER — Encounter: Payer: No Typology Code available for payment source | Admitting: Physical Therapy

## 2019-11-30 ENCOUNTER — Inpatient Hospital Stay (HOSPITAL_COMMUNITY)
Admission: EM | Admit: 2019-11-30 | Discharge: 2019-12-02 | DRG: 439 | Disposition: A | Discharge status: Home / Self Care | Payer: Medicare HMO | Attending: Student in an Organized Health Care Education/Training Program | Admitting: Student in an Organized Health Care Education/Training Program

## 2019-11-30 ENCOUNTER — Other Ambulatory Visit: Payer: Self-pay

## 2019-11-30 ENCOUNTER — Encounter (HOSPITAL_COMMUNITY): Payer: Self-pay | Admitting: Emergency Medicine

## 2019-11-30 DIAGNOSIS — K219 Gastro-esophageal reflux disease without esophagitis: Secondary | ICD-10-CM | POA: Diagnosis present

## 2019-11-30 DIAGNOSIS — E876 Hypokalemia: Secondary | ICD-10-CM | POA: Diagnosis present

## 2019-11-30 DIAGNOSIS — F1010 Alcohol abuse, uncomplicated: Secondary | ICD-10-CM | POA: Diagnosis present

## 2019-11-30 DIAGNOSIS — K859 Acute pancreatitis without necrosis or infection, unspecified: Secondary | ICD-10-CM | POA: Diagnosis present

## 2019-11-30 DIAGNOSIS — Z7289 Other problems related to lifestyle: Secondary | ICD-10-CM | POA: Diagnosis not present

## 2019-11-30 DIAGNOSIS — Z79899 Other long term (current) drug therapy: Secondary | ICD-10-CM

## 2019-11-30 DIAGNOSIS — I10 Essential (primary) hypertension: Secondary | ICD-10-CM | POA: Diagnosis present

## 2019-11-30 DIAGNOSIS — Z20822 Contact with and (suspected) exposure to covid-19: Secondary | ICD-10-CM | POA: Diagnosis present

## 2019-11-30 DIAGNOSIS — Z96611 Presence of right artificial shoulder joint: Secondary | ICD-10-CM | POA: Diagnosis present

## 2019-11-30 DIAGNOSIS — Z7982 Long term (current) use of aspirin: Secondary | ICD-10-CM

## 2019-11-30 DIAGNOSIS — K860 Alcohol-induced chronic pancreatitis: Secondary | ICD-10-CM | POA: Diagnosis present

## 2019-11-30 DIAGNOSIS — E871 Hypo-osmolality and hyponatremia: Secondary | ICD-10-CM | POA: Diagnosis present

## 2019-11-30 DIAGNOSIS — R112 Nausea with vomiting, unspecified: Secondary | ICD-10-CM | POA: Diagnosis present

## 2019-11-30 DIAGNOSIS — R1013 Epigastric pain: Secondary | ICD-10-CM | POA: Diagnosis not present

## 2019-11-30 DIAGNOSIS — K852 Alcohol induced acute pancreatitis without necrosis or infection: Secondary | ICD-10-CM

## 2019-11-30 DIAGNOSIS — F17210 Nicotine dependence, cigarettes, uncomplicated: Secondary | ICD-10-CM | POA: Diagnosis present

## 2019-11-30 DIAGNOSIS — Z96612 Presence of left artificial shoulder joint: Secondary | ICD-10-CM | POA: Diagnosis present

## 2019-11-30 LAB — URINALYSIS, ROUTINE W REFLEX MICROSCOPIC
Bacteria, UA: NONE SEEN
Bilirubin Urine: NEGATIVE
Glucose, UA: NEGATIVE mg/dL
Ketones, ur: 20 mg/dL — AB
Leukocytes,Ua: NEGATIVE
Nitrite: NEGATIVE
Protein, ur: NEGATIVE mg/dL
Specific Gravity, Urine: 1.011 (ref 1.005–1.030)
pH: 7 (ref 5.0–8.0)

## 2019-11-30 LAB — COMPREHENSIVE METABOLIC PANEL
ALT: 48 U/L — ABNORMAL HIGH (ref 0–44)
ALT: 33 U/L (ref 0–44)
AST: 41 U/L (ref 15–41)
AST: 40 U/L (ref 15–41)
Albumin: 4.3 g/dL (ref 3.5–5.0)
Albumin: 3.4 g/dL — ABNORMAL LOW (ref 3.5–5.0)
Alkaline Phosphatase: 96 U/L (ref 38–126)
Alkaline Phosphatase: 73 U/L (ref 38–126)
Anion gap: 15 (ref 5–15)
Anion gap: 11 (ref 5–15)
BUN: 8 mg/dL (ref 6–20)
BUN: 8 mg/dL (ref 6–20)
CO2: 24 mmol/L (ref 22–32)
CO2: 20 mmol/L — ABNORMAL LOW (ref 22–32)
Calcium: 9.6 mg/dL (ref 8.9–10.3)
Calcium: 7.9 mg/dL — ABNORMAL LOW (ref 8.9–10.3)
Chloride: 94 mmol/L — ABNORMAL LOW (ref 98–111)
Chloride: 104 mmol/L (ref 98–111)
Creatinine, Ser: 1.19 mg/dL (ref 0.61–1.24)
Creatinine, Ser: 0.94 mg/dL (ref 0.61–1.24)
GFR calc Af Amer: >60 mL/min (ref >60)
GFR calc Af Amer: >60 mL/min (ref >60)
GFR calc non Af Amer: >60 mL/min (ref >60)
GFR calc non Af Amer: >60 mL/min (ref >60)
Glucose, Bld: 188 mg/dL — ABNORMAL HIGH (ref 70–99)
Glucose, Bld: 111 mg/dL — ABNORMAL HIGH (ref 70–99)
Potassium: 3.3 mmol/L — ABNORMAL LOW (ref 3.5–5.1)
Potassium: 4.6 mmol/L (ref 3.5–5.1)
Sodium: 133 mmol/L — ABNORMAL LOW (ref 135–145)
Sodium: 135 mmol/L (ref 135–145)
Total Bilirubin: 0.8 mg/dL (ref 0.3–1.2)
Total Bilirubin: 1.6 mg/dL — ABNORMAL HIGH (ref 0.3–1.2)
Total Protein: 8.8 g/dL — ABNORMAL HIGH (ref 6.5–8.1)
Total Protein: 6.6 g/dL (ref 6.5–8.1)

## 2019-11-30 LAB — CBC WITH DIFFERENTIAL/PLATELET
Abs Immature Granulocytes: 0.04 10*3/uL (ref 0.00–0.07)
Basophils Absolute: 0 10*3/uL (ref 0.0–0.1)
Basophils Relative: 0 %
Eosinophils Absolute: 0 10*3/uL (ref 0.0–0.5)
Eosinophils Relative: 0 %
HCT: 49.3 % (ref 39.0–52.0)
Hemoglobin: 16.5 g/dL (ref 13.0–17.0)
Immature Granulocytes: 0 %
Lymphocytes Relative: 11 %
Lymphs Abs: 1.2 10*3/uL (ref 0.7–4.0)
MCH: 29.5 pg (ref 26.0–34.0)
MCHC: 33.5 g/dL (ref 30.0–36.0)
MCV: 88 fL (ref 80.0–100.0)
Monocytes Absolute: 0.9 10*3/uL (ref 0.1–1.0)
Monocytes Relative: 8 %
Neutro Abs: 8.9 10*3/uL — ABNORMAL HIGH (ref 1.7–7.7)
Neutrophils Relative %: 81 %
Platelets: 395 10*3/uL (ref 150–400)
RBC: 5.6 MIL/uL (ref 4.22–5.81)
RDW: 13.1 % (ref 11.5–15.5)
WBC: 11.1 10*3/uL — ABNORMAL HIGH (ref 4.0–10.5)
nRBC: 0 % (ref 0.0–0.2)

## 2019-11-30 LAB — CBC
HCT: 48.6 % (ref 39.0–52.0)
Hemoglobin: 16.1 g/dL (ref 13.0–17.0)
MCH: 29.2 pg (ref 26.0–34.0)
MCHC: 33.1 g/dL (ref 30.0–36.0)
MCV: 88 fL (ref 80.0–100.0)
Platelets: 317 10*3/uL (ref 150–400)
RBC: 5.52 MIL/uL (ref 4.22–5.81)
RDW: 13.2 % (ref 11.5–15.5)
WBC: 12.6 10*3/uL — ABNORMAL HIGH (ref 4.0–10.5)
nRBC: 0 % (ref 0.0–0.2)

## 2019-11-30 LAB — MAGNESIUM: Magnesium: 1.8 mg/dL (ref 1.7–2.4)

## 2019-11-30 LAB — ETHANOL: Alcohol, Ethyl (B): <10 mg/dL (ref <10)

## 2019-11-30 LAB — PHOSPHORUS: Phosphorus: 2.4 mg/dL — ABNORMAL LOW (ref 2.5–4.6)

## 2019-11-30 LAB — SARS CORONAVIRUS 2 (TAT 6-24 HRS): SARS Coronavirus 2: NEGATIVE

## 2019-11-30 LAB — LIPASE, BLOOD: Lipase: 475 U/L — ABNORMAL HIGH (ref 11–51)

## 2019-11-30 MED ORDER — ONDANSETRON HCL 4 MG PO TABS: 4.0000 mg | ORAL_TABLET | Freq: Four times a day (QID) | ORAL | Status: DC | PRN

## 2019-11-30 MED ORDER — LORAZEPAM 1 MG PO TABS: 0.0000 mg | ORAL_TABLET | Freq: Four times a day (QID) | ORAL | Status: DC

## 2019-11-30 MED ORDER — THIAMINE HCL 100 MG/ML IJ SOLN
100.0000 mg | Freq: Every day | INTRAMUSCULAR | Status: DC
  Administered 2019-11-30: 100 mg via INTRAVENOUS
  Filled 2019-11-30: qty 2

## 2019-11-30 MED ORDER — LORAZEPAM 2 MG/ML IJ SOLN: 0.0000 mg | Freq: Two times a day (BID) | INTRAMUSCULAR | Status: DC

## 2019-11-30 MED ORDER — MORPHINE SULFATE (PF) 4 MG/ML IV SOLN
4.0000 mg | Freq: Once | INTRAVENOUS | Status: AC
  Administered 2019-11-30: 4 mg via INTRAVENOUS
  Filled 2019-11-30: qty 1

## 2019-11-30 MED ORDER — THIAMINE HCL 100 MG PO TABS: 100.0000 mg | ORAL_TABLET | Freq: Every day | ORAL | Status: DC

## 2019-11-30 MED ORDER — ADULT MULTIVITAMIN W/MINERALS CH
1.0000 | ORAL_TABLET | Freq: Every day | ORAL | Status: DC
  Administered 2019-11-30 – 2019-12-02 (×3): 1 via ORAL
  Filled 2019-11-30 (×3): qty 1

## 2019-11-30 MED ORDER — ENOXAPARIN SODIUM 40 MG/0.4ML ~~LOC~~ SOLN
40.0000 mg | SUBCUTANEOUS | Status: DC
  Administered 2019-12-01: 40 mg via SUBCUTANEOUS
  Filled 2019-11-30 (×2): qty 0.4

## 2019-11-30 MED ORDER — ONDANSETRON HCL 4 MG/2ML IJ SOLN
4.0000 mg | Freq: Once | INTRAMUSCULAR | Status: AC
  Administered 2019-11-30: 4 mg via INTRAVENOUS
  Filled 2019-11-30: qty 2

## 2019-11-30 MED ORDER — LORAZEPAM 1 MG PO TABS: 0.0000 mg | ORAL_TABLET | Freq: Two times a day (BID) | ORAL | Status: DC

## 2019-11-30 MED ORDER — PANTOPRAZOLE SODIUM 40 MG PO TBEC
40.0000 mg | DELAYED_RELEASE_TABLET | Freq: Every day | ORAL | Status: DC
  Administered 2019-12-01 – 2019-12-02 (×2): 40 mg via ORAL
  Filled 2019-11-30 (×2): qty 1

## 2019-11-30 MED ORDER — ONDANSETRON HCL 4 MG/2ML IJ SOLN
4.0000 mg | Freq: Four times a day (QID) | INTRAMUSCULAR | Status: DC | PRN
  Administered 2019-11-30 – 2019-12-01 (×3): 4 mg via INTRAVENOUS
  Filled 2019-11-30 (×3): qty 2

## 2019-11-30 MED ORDER — THIAMINE HCL 100 MG/ML IJ SOLN: 100.0000 mg | Freq: Every day | INTRAMUSCULAR | Status: DC

## 2019-11-30 MED ORDER — HYDROMORPHONE HCL 1 MG/ML IJ SOLN
1.0000 mg | INTRAMUSCULAR | Status: DC | PRN
  Administered 2019-11-30 – 2019-12-02 (×11): 1 mg via INTRAVENOUS
  Filled 2019-11-30 (×11): qty 1

## 2019-11-30 MED ORDER — FOLIC ACID 1 MG PO TABS
1.0000 mg | ORAL_TABLET | Freq: Every day | ORAL | Status: DC
  Administered 2019-11-30 – 2019-12-02 (×3): 1 mg via ORAL
  Filled 2019-11-30 (×3): qty 1

## 2019-11-30 MED ORDER — SODIUM CHLORIDE 0.9 % IV BOLUS
1000.0000 mL | Freq: Once | INTRAVENOUS | Status: AC
  Administered 2019-11-30: 1000 mL via INTRAVENOUS

## 2019-11-30 MED ORDER — LORAZEPAM 2 MG/ML IJ SOLN
1.0000 mg | INTRAMUSCULAR | Status: DC | PRN
  Administered 2019-12-01: 2 mg via INTRAVENOUS
  Filled 2019-11-30: qty 1

## 2019-11-30 MED ORDER — LORAZEPAM 1 MG PO TABS: 1.0000 mg | ORAL_TABLET | ORAL | Status: DC | PRN

## 2019-11-30 MED ORDER — LORAZEPAM 2 MG/ML IJ SOLN
0.0000 mg | Freq: Four times a day (QID) | INTRAMUSCULAR | Status: DC
  Administered 2019-11-30: 1 mg via INTRAVENOUS
  Filled 2019-11-30: qty 1

## 2019-11-30 MED ORDER — THIAMINE HCL 100 MG PO TABS
100.0000 mg | ORAL_TABLET | Freq: Every day | ORAL | Status: DC
  Administered 2019-12-01 – 2019-12-02 (×2): 100 mg via ORAL
  Filled 2019-11-30 (×2): qty 1

## 2019-11-30 MED ORDER — LACTATED RINGERS IV SOLN: INTRAVENOUS | Status: AC

## 2019-11-30 NOTE — ED Triage Notes (Signed)
Pt reports abdominal pain and vomiting for the past few days, hx of pancreatitis and states this feels the same. Last drank 3-4 days ago, typically drinks a few beers per day. Pt moaning and writhing on bed upon entering treatment room. Has protonix that he reports he has been taking without relief. A/ox4.

## 2019-11-30 NOTE — ED Notes (Signed)
Lunch tray ordered 

## 2019-11-30 NOTE — ED Notes (Signed)
Pt brother Stephen [name] [phone]

## 2019-11-30 NOTE — H&P (Signed)
Date: 11/30/2019               Patient Name:  Stephen [name] MRN: [id number]  DOB: Jan 28, 1961 Age / Sex: 59 y.o., male   PCP: Earlene Plater, MD         Medical Service: Internal Medicine Teaching Service         Attending Physician: Dr. Sid Falcon, MD    First Contact: Dr. Charleen Kirks  Pager: [phone]  Second Contact: Dr. Koleen Distance Pager: [phone]       After Hours (After 5p/  First Contact Pager: [phone]  weekends / holidays): Second Contact Pager: [phone]   Chief Complaint: Abdominal Pain   History of Present Illness:   Mr. Tason Larmore is a 59 y/o male with a PMHx of chronic pancreatitis, GERD, TB, recent shoulder arthroplasty, who presents to the ED with c/o abdominal pain.   Mr. Armanda Heritage states current pain feels like his usual pancreatitis that he gets from drinking alcohol. His abdominal pain radiates to his 'brain' and his back, as well as right flank. Pain feels like his stomach is in a knot. He endorses intermittent nausea with episodes of emesis for the past 3 days, most recent emesis since arriving in the ED. He denies fever, chills, SOB, chest pain. He has been able to eat some crackers in the past few days.   His last alcoholic beverage was 3 days ago, at which time he had 2 12-ounce bottles of beer.   ED Course:  CBC remarkable for mild leukocytosis of 11.1. CMP notable for mild hyponatremia of 133, hypokalemia of 3.3, and AST of 48. Lipase of 475.   Meds:  Current Meds  Medication Sig  . aspirin 81 MG EC tablet TAKE 1 TABLET BY MOUTH EVERY DAY (Patient taking differently: Take 81 mg by mouth daily. )  . pantoprazole (PROTONIX) 40 MG tablet TAKE 1 TABLET BY MOUTH EVERY DAY (Patient taking differently: Take 40 mg by mouth daily. )   Allergies: Allergies as of 11/30/2019  . (No Known Allergies)   Past Medical History:  Diagnosis Date  . Arthritis   . GERD (gastroesophageal reflux disease)   . Pancreatitis   . Tuberculosis    yrs. ago pt. states he  was dx. here at [facility]   Family History:  Unable to obtain  Social History:  Alcohol: last alcoholic drink 3 days ago, only drinks beer  Drugs: Marijuana every other day Tobacco: Daily smoker   Review of Systems: A complete ROS was negative except as per HPI.   Physical Exam: Blood pressure (!) 170/109, pulse 96, temperature 97.9 F (36.6 C), temperature source Oral, resp. rate 15, SpO2 100 %.  Physical Exam Vitals and nursing note reviewed.  Constitutional:      General: He is in acute distress (severe pain).  Cardiovascular:     Rate and Rhythm: Regular rhythm. Tachycardia present.     Heart sounds: No murmur.  Pulmonary:     Effort: No respiratory distress.     Breath sounds: No wheezing or rales.  Abdominal:     General: Bowel sounds are decreased. There is no distension.     Palpations: Abdomen is soft.     Tenderness: There is abdominal tenderness (generalized but most tender in tender in the RUQ.). There is guarding.  Musculoskeletal:        General: No signs of injury.  Skin:    General: Skin is warm and dry.  Findings: No erythema, lesion or rash.  Neurological:     Mental Status: He is alert and oriented to person, place, and time. Mental status is at baseline.     Motor: No weakness.     Gait: Gait normal.  Psychiatric:        Mood and Affect: Mood is anxious. Affect is angry.        Speech: Speech normal.        Behavior: Behavior is agitated.    Assessment & Plan by Problem: Active Problems:   Pancreatitis, acute  # Acute on Chronic Pancreatitis 2/2 Alcohol Use Patient has a history of chronic pancreatitis, now presenting with abdominal pain. Lipase elevated at 475. Secondary to pain, he is mildly tachycardic and hypertensive, otherwise hemodynamically stable. No current evidence of infectious process, as leukocytosis very mild. Will monitor for worsening.   - Telemetry  - IVF: LR @ 125 cc/hr for 24 hours - Pain management with Dilaudid 1 mg q3h -  Zofran PRN for nausea - CBC and BMP in the AM  # Alcohol Use Disorder Long term history of AUD with multiple bouts of pancreatitis. He has been provided resources in the past to help with cessation. Will discuss this again once acute pain improves.   - CIWA with Ativan - Folate tablet - Thiamine tablet  # Hypertension: Not on medication at home. Markedly elevated on arrival at 178/118 with worsening to 194/111. Per chart review, at his last PCP visit it was 141/88. Will hold off on anti-HTN as likely due to acute pain.    Diet: Full, advance as tolerated  DVT ppx: Lovenox  Code: FULL  Dispo: Admit patient to Inpatient with expected length of stay greater than 2 midnights.  Signed: Dr. Jose Persia Internal Medicine PGY-1  Pager: [phone] 11/30/2019, 1:16 PM

## 2019-11-30 NOTE — ED Provider Notes (Signed)
[facility] EMERGENCY DEPARTMENT Provider Note   CSN: [id number] Arrival date & time: 11/30/19  [id number]     History Chief Complaint  Patient presents with  . Pancreatitis    Stephen [name] is a 59 y.o. male.  59yo male with past medical history of pancreatitis, GERD, TB, with complaint of epigastric/diffuse abdominal pain, radiates to back with nausea and vomiting, similar to prior epiosde of pancreatitis, taking protonox without improvement. Reports having fevers and chills, has not checked temperature. No changes in bowel or bladder habits. Reports dinking 2-3 alcohol beverages daily, smokes marijuana. No other complaints or concerns.         Past Medical History:  Diagnosis Date  . Arthritis   . GERD (gastroesophageal reflux disease)   . Pancreatitis   . Tuberculosis    yrs. ago pt. states he was dx. here at [facility]    Patient Active Problem List   Diagnosis Date Noted  . Pancreatitis, acute 11/30/2019  . Rotator cuff arthropathy 09/03/2019  . Hypokalemia 05/12/2019  . Rotator cuff arthropathy of left shoulder   . Shoulder arthritis 12/11/2018  . Hepatitis C antibody test positive 01/19/2018  . Chronic pain of both shoulders 01/17/2018  . Screening for colon cancer 01/17/2018  . Sinus congestion 01/17/2018  . Malnutrition of moderate degree 01/09/2018  . Chronic pancreatitis ([place]) 01/06/2018  . Hyponatremia 01/06/2018    Past Surgical History:  Procedure Laterality Date  . FRACTURE SURGERY     jaw  . REVERSE SHOULDER ARTHROPLASTY Left 12/11/2018   Procedure: LEFT REVERSE SHOULDER ARTHROPLASTY;  Surgeon: Meredith Pel, MD;  Location: [place];  Service: Orthopedics;  Laterality: Left;  . REVERSE SHOULDER ARTHROPLASTY Right 09/03/2019  . REVERSE SHOULDER ARTHROPLASTY Right 09/03/2019   Procedure: RIGHT REVERSE SHOULDER ARTHROPLASTY;  Surgeon: Meredith Pel, MD;  Location: [place];  Service: Orthopedics;  Laterality: Right;       No family  history on file.  Social History   Tobacco Use  . Smoking status: Current Every Day Smoker    Years: 40.00    Types: Cigarettes  . Smokeless tobacco: Never Used  . Tobacco comment: 1 cigarette per day  Substance Use Topics  . Alcohol use: Yes    Comment: 1-2 beers daily.   . Drug use: Yes    Types: Marijuana    Comment: 1 joint every other day    Home Medications Prior to Admission medications   Medication Sig Start Date End Date Taking? Authorizing Provider  aspirin 81 MG EC tablet TAKE 1 TABLET BY MOUTH EVERY DAY Patient taking differently: Take 81 mg by mouth daily.  09/09/19  Yes Newt Minion, MD  pantoprazole (PROTONIX) 40 MG tablet TAKE 1 TABLET BY MOUTH EVERY DAY Patient taking differently: Take 40 mg by mouth daily.  11/04/19  Yes Earlene Plater, MD    Allergies    Patient has no known allergies.  Review of Systems   Review of Systems  Constitutional: Positive for chills and diaphoresis.  Respiratory: Negative for shortness of breath.   Cardiovascular: Negative for chest pain.  Gastrointestinal: Positive for abdominal pain, nausea and vomiting. Negative for constipation and diarrhea.  Genitourinary: Negative for dysuria and frequency.  Musculoskeletal: Positive for back pain.  Skin: Negative for rash and wound.  Allergic/Immunologic: Negative for immunocompromised state.  Neurological: Negative for weakness.  Psychiatric/Behavioral: Negative for confusion.  All other systems reviewed and are negative.   Physical Exam Updated Vital Signs BP (!) 170/109  Pulse 96   Temp 97.9 F (36.6 C) (Oral)   Resp 15   SpO2 100%   Physical Exam Vitals and nursing note reviewed.  Constitutional:      General: He is not in acute distress.    Appearance: He is well-developed. He is not diaphoretic.  HENT:     Head: Normocephalic and atraumatic.  Cardiovascular:     Rate and Rhythm: Regular rhythm. Tachycardia present.     Pulses: Normal pulses.     Heart  sounds: Normal heart sounds.  Pulmonary:     Effort: Pulmonary effort is normal.     Breath sounds: Normal breath sounds.  Abdominal:     Tenderness: There is guarding.     Comments: Keeps abdominal muscles flexed, will not relax for exam.   Skin:    General: Skin is warm and dry.     Findings: No erythema or rash.  Neurological:     Mental Status: He is alert and oriented to person, place, and time.  Psychiatric:        Behavior: Behavior normal.     ED Results / Procedures / Treatments   Labs (all labs ordered are listed, but only abnormal results are displayed) Labs Reviewed  CBC WITH DIFFERENTIAL/PLATELET - Abnormal; Notable for the following components:      Result Value   WBC 11.1 (*)    Neutro Abs 8.9 (*)    All other components within normal limits  COMPREHENSIVE METABOLIC PANEL - Abnormal; Notable for the following components:   Sodium 133 (*)    Potassium 3.3 (*)    Chloride 94 (*)    Glucose, Bld 188 (*)    Total Protein 8.8 (*)    ALT 48 (*)    All other components within normal limits  LIPASE, BLOOD - Abnormal; Notable for the following components:   Lipase 475 (*)    All other components within normal limits  URINALYSIS, ROUTINE W REFLEX MICROSCOPIC - Abnormal; Notable for the following components:   Hgb urine dipstick SMALL (*)    Ketones, ur 20 (*)    All other components within normal limits  SARS CORONAVIRUS 2 (TAT 6-24 HRS)  ETHANOL  COMPREHENSIVE METABOLIC PANEL  MAGNESIUM  PHOSPHORUS  CBC    EKG None  Radiology No results found.  Procedures Procedures (including critical care time)  Medications Ordered in ED Medications  LORazepam (ATIVAN) injection 0-4 mg (1 mg Intravenous Given 11/30/19 0946)    Or  LORazepam (ATIVAN) tablet 0-4 mg ( Oral See Alternative 11/30/19 0946)  LORazepam (ATIVAN) injection 0-4 mg (has no administration in time range)    Or  LORazepam (ATIVAN) tablet 0-4 mg (has no administration in time range)  thiamine  tablet 100 mg ( Oral See Alternative 11/30/19 0947)    Or  thiamine (B-1) injection 100 mg (100 mg Intravenous Given 11/30/19 0947)  pantoprazole (PROTONIX) EC tablet 40 mg (has no administration in time range)  enoxaparin (LOVENOX) injection 40 mg (has no administration in time range)  ondansetron (ZOFRAN) tablet 4 mg (has no administration in time range)    Or  ondansetron (ZOFRAN) injection 4 mg (has no administration in time range)  HYDROmorphone (DILAUDID) injection 1 mg (1 mg Intravenous Given 11/30/19 1302)  lactated ringers infusion ( Intravenous New Bag/Given 11/30/19 1305)  LORazepam (ATIVAN) tablet 1-4 mg (has no administration in time range)    Or  LORazepam (ATIVAN) injection 1-4 mg (has no administration in time range)  folic  acid (FOLVITE) tablet 1 mg (has no administration in time range)  multivitamin with minerals tablet 1 tablet (has no administration in time range)  ondansetron (ZOFRAN) injection 4 mg (4 mg Intravenous Given 11/30/19 0840)  morphine 4 MG/ML injection 4 mg (4 mg Intravenous Given 11/30/19 0841)  sodium chloride 0.9 % bolus 1,000 mL (0 mLs Intravenous Stopped 11/30/19 1141)    ED Course  I have reviewed the triage vital signs and the nursing notes.  Pertinent labs & imaging results that were available during my care of the patient were reviewed by me and considered in my medical decision making (see chart for details).  Clinical Course as of Nov 29 1344  Sat Nov 29, 7209  4825 59 year old male with complaint of abdominal pain with nausea and vomiting similar to previous episodes of pancreatitis.  Reports last had alcohol 3 days ago.  Patient appears uncomfortable, is hypertensive and tachycardic.  Patient does not allow for abdominal exam, keeps his abdomen tense.  Review of labs, no significant changes from prior found, patient is not febrile, lipase is elevated at 475.  Patient was given IV fluids with morphine, Zofran, thiamine and a multivitamin.  CIWA  protocol initiated due to history of alcohol abuse and patient is hypertensive and tachycardic, patient was given 1 mg of Ativan per CIWA protocol.  On recheck, patient is sleeping, arouses easily to verbal stimuli, states he is still feeling unwell.  Plan is to admit to hospital PCP is through the residents clinic.   [LM]    Clinical Course User Index [LM] Roque Lias   MDM Rules/Calculators/A&P                      Final Clinical Impression(s) / ED Diagnoses Final diagnoses:  Alcohol-induced acute pancreatitis, unspecified complication status    Rx / DC Orders ED Discharge Orders    None       Tacy Learn, PA-C 11/30/19 1346    Long, Wonda Old[facility], MD 11/30/19 [phone]

## 2019-11-30 NOTE — Progress Notes (Signed)
Pt ambulated to bathroom with 2 assist.  HR increased 140-150 with pt reports dizziness and abd pain.  HR 90-100 when back in bed.  Pain and nausea medication and administered per pt request.  Pt resting without complaints at present.

## 2019-12-01 DIAGNOSIS — I10 Essential (primary) hypertension: Secondary | ICD-10-CM

## 2019-12-01 DIAGNOSIS — F1010 Alcohol abuse, uncomplicated: Secondary | ICD-10-CM

## 2019-12-01 DIAGNOSIS — K852 Alcohol induced acute pancreatitis without necrosis or infection: Principal | ICD-10-CM

## 2019-12-01 LAB — CBC
HCT: 43.2 % (ref 39.0–52.0)
Hemoglobin: 14.3 g/dL (ref 13.0–17.0)
MCH: 29.2 pg (ref 26.0–34.0)
MCHC: 33.1 g/dL (ref 30.0–36.0)
MCV: 88.2 fL (ref 80.0–100.0)
Platelets: 285 10*3/uL (ref 150–400)
RBC: 4.9 MIL/uL (ref 4.22–5.81)
RDW: 13.2 % (ref 11.5–15.5)
WBC: 11.1 10*3/uL — ABNORMAL HIGH (ref 4.0–10.5)
nRBC: 0 % (ref 0.0–0.2)

## 2019-12-01 LAB — BASIC METABOLIC PANEL
Anion gap: 11 (ref 5–15)
BUN: 6 mg/dL (ref 6–20)
CO2: 25 mmol/L (ref 22–32)
Calcium: 8.9 mg/dL (ref 8.9–10.3)
Chloride: 99 mmol/L (ref 98–111)
Creatinine, Ser: 0.69 mg/dL (ref 0.61–1.24)
GFR calc Af Amer: >60 mL/min (ref >60)
GFR calc non Af Amer: >60 mL/min (ref >60)
Glucose, Bld: 95 mg/dL (ref 70–99)
Potassium: 3.3 mmol/L — ABNORMAL LOW (ref 3.5–5.1)
Sodium: 135 mmol/L (ref 135–145)

## 2019-12-01 MED ORDER — POTASSIUM CHLORIDE 10 MEQ/100ML IV SOLN
10.0000 meq | INTRAVENOUS | Status: AC
  Administered 2019-12-01 (×3): 10 meq via INTRAVENOUS
  Filled 2019-12-01 (×3): qty 100

## 2019-12-01 NOTE — Progress Notes (Signed)
  Date: 12/01/2019  Patient name: Stephen [name]  Medical record number: [id number]  Date of birth: 24-May-1961   I have seen and evaluated Stephen [name] and discussed their care with the Residency Team. Briefly, Stephen [name] was admitted with pancreatitis and possible ETOH withdrawal.  Blood pressure was also high.  He reports no real improvement this morning.    Vitals:   12/01/19 0505 12/01/19 0754  BP: (!) 153/99 (!) 142/90  Pulse:  99  Resp: 14   Temp:  98.9 F (37.2 C)  SpO2:  100%   General: Lying in bed, NAD Eyes: Anicteric Sclerae CV: Mildly tachycardic, regular, no murmur Pulm: Breathing comfortably, no wheezing Abd: + TTP with very light touch in the epigastrium, +BS, ND MSK: Thin extremities Neuro: No acute deficit, alert and oriented  Assessment and Plan: I have seen and evaluated the patient as outlined above. I agree with the formulated Assessment and Plan as detailed in the residents' note, with the following changes:   1. Acute pancreatitis related to ETOH use - Continue IVF - Given he has not been able to eat, would transition to NPO for 24 - 48 hours until his nausea and emesis resolve and pain is starting to improve. - Pain and nausea medication - Replete K  Other issues per Dr. Noni Saupe note.    Sid Falcon, MD 1/24/20219:05 AM

## 2019-12-01 NOTE — Progress Notes (Addendum)
   Subjective:   Stephen [name] is still having some abdominal pain and nausea this morning. Has not tried eating, as he worries about vomiting. Endorses periods of hot flashes, intermittent headaches.   No history of alcohol withdrawal seizures.    Objective:  Vital signs in last 24 hours: Vitals:   12/01/19 0154 12/01/19 0211 12/01/19 0310 12/01/19 0500  BP: (!) 157/102 (!) 157/102 (!) 148/101 (!) 153/99  Pulse: (!) 104   96  Resp:  15 13 14   Temp:      TempSrc:      SpO2:    100%    Physical Exam Vitals and nursing note reviewed.  Constitutional:      General: He is not in acute distress.    Appearance: He is normal weight.  Cardiovascular:     Rate and Rhythm: Regular rhythm. Tachycardia present.     Heart sounds: No murmur.  Pulmonary:     Effort: Pulmonary effort is normal. No respiratory distress.  Abdominal:     General: Bowel sounds are normal.     Tenderness: There is abdominal tenderness.  Skin:    General: Skin is warm and dry.  Neurological:     General: No focal deficit present.     Mental Status: He is alert and oriented to person, place, and time. Mental status is at baseline.  Psychiatric:        Mood and Affect: Mood normal.        Behavior: Behavior normal.    Assessment/Plan:  Active Problems:   Pancreatitis, acute  # Acute on Chronic Pancreatitis 2/2 Alcohol Use Some improvement in pain with Dilaudid but still not able to tolerate diet. Will switch back to NPO until nausea improves due to risk of aspiration.   - Telemetry  - IVF: LR @ 125 cc/hr till tolerating PO - Pain management with Dilaudid 1 mg q3h - Zofran PRN for nausea - NPO - CBC and BMP in the AM  # Alcohol Use Disorder CIWA of 12 overnight requiring one time dose of Ativan. Stable this AM and denying symptoms of withdrawal.   - CIWA with Ativan - Folate tablet  - Thiamine tablet  # Hypertension: Improving now that pain is better controlled. Continue to monitor.    Dispo: Anticipated discharge pending medical improvement.   Dr. Jose Persia Internal Medicine PGY-1  Pager: [phone] 12/01/2019, 6:45 AM

## 2019-12-02 DIAGNOSIS — R1013 Epigastric pain: Secondary | ICD-10-CM

## 2019-12-02 DIAGNOSIS — Z7289 Other problems related to lifestyle: Secondary | ICD-10-CM

## 2019-12-02 DIAGNOSIS — K860 Alcohol-induced chronic pancreatitis: Secondary | ICD-10-CM

## 2019-12-02 LAB — CBC WITH DIFFERENTIAL/PLATELET
Abs Immature Granulocytes: 0.03 10*3/uL (ref 0.00–0.07)
Basophils Absolute: 0 10*3/uL (ref 0.0–0.1)
Basophils Relative: 1 %
Eosinophils Absolute: 0.2 10*3/uL (ref 0.0–0.5)
Eosinophils Relative: 2 %
HCT: 38.3 % — ABNORMAL LOW (ref 39.0–52.0)
Hemoglobin: 12.3 g/dL — ABNORMAL LOW (ref 13.0–17.0)
Immature Granulocytes: 0 %
Lymphocytes Relative: 21 %
Lymphs Abs: 1.8 10*3/uL (ref 0.7–4.0)
MCH: 28.9 pg (ref 26.0–34.0)
MCHC: 32.1 g/dL (ref 30.0–36.0)
MCV: 89.9 fL (ref 80.0–100.0)
Monocytes Absolute: 1 10*3/uL (ref 0.1–1.0)
Monocytes Relative: 12 %
Neutro Abs: 5.7 10*3/uL (ref 1.7–7.7)
Neutrophils Relative %: 64 %
Platelets: 273 10*3/uL (ref 150–400)
RBC: 4.26 MIL/uL (ref 4.22–5.81)
RDW: 13 % (ref 11.5–15.5)
WBC: 8.8 10*3/uL (ref 4.0–10.5)
nRBC: 0 % (ref 0.0–0.2)

## 2019-12-02 LAB — BASIC METABOLIC PANEL
Anion gap: 13 (ref 5–15)
BUN: 7 mg/dL (ref 6–20)
CO2: 25 mmol/L (ref 22–32)
Calcium: 8.8 mg/dL — ABNORMAL LOW (ref 8.9–10.3)
Chloride: 98 mmol/L (ref 98–111)
Creatinine, Ser: 0.9 mg/dL (ref 0.61–1.24)
GFR calc Af Amer: >60 mL/min (ref >60)
GFR calc non Af Amer: >60 mL/min (ref >60)
Glucose, Bld: 63 mg/dL — ABNORMAL LOW (ref 70–99)
Potassium: 3.3 mmol/L — ABNORMAL LOW (ref 3.5–5.1)
Sodium: 136 mmol/L (ref 135–145)

## 2019-12-02 MED ORDER — OXYCODONE-ACETAMINOPHEN 7.5-325 MG PO TABS
1.0000 | ORAL_TABLET | ORAL | Status: DC | PRN
  Administered 2019-12-02: 1 via ORAL
  Filled 2019-12-02: qty 1

## 2019-12-02 MED ORDER — OXYCODONE-ACETAMINOPHEN 7.5-325 MG PO TABS: 1.0000 | ORAL_TABLET | Freq: Four times a day (QID) | ORAL | 0 refills | Status: AC | PRN

## 2019-12-02 NOTE — Discharge Summary (Signed)
Name: Stephen [name] MRN: [id number] DOB: 06-Feb-1961 59 y.o. PCP: Earlene Plater, MD  Date of Admission: 11/30/2019  8:21 AM Date of Discharge: 12/02/2019 Attending Physician: Axel Filler, *  Discharge Diagnosis: 1. Acute Pancreatitis  2. Alcohol Use Disorder  Discharge Medications: Allergies as of 12/02/2019   No Known Allergies     Medication List    TAKE these medications   aspirin 81 MG EC tablet TAKE 1 TABLET BY MOUTH EVERY DAY   oxyCODONE-acetaminophen 7.5-325 MG tablet Commonly known as: PERCOCET Take 1 tablet by mouth every 6 (six) hours as needed for up to 3 days for severe pain.   pantoprazole 40 MG tablet Commonly known as: PROTONIX TAKE 1 TABLET BY MOUTH EVERY DAY       Disposition and follow-up:   [name] was discharged from [facility] in Good condition.  At the hospital follow up visit please address:  1.  Consider Naltrexone if patient is struggling with alcohol cessation  2.  Labs / imaging needed at time of follow-up: None   3.  Pending labs/ test needing follow-up: None   Follow-up Appointments: Follow-up Information    Earlene Plater, MD. Schedule an appointment as soon as possible for a visit in 1 week(s).   Specialty: Internal Medicine Contact information: [address] [place] [address] [place] [facility] Course by problem list: 1. Acute Pancreatitis  Patient has a history of chronic pancreatitis. Presented with abdominal pain, N/V after drinking beer several days prior. Lipase elevated at 475. Treatment initiated with NPO diet, IVF, analgesics and zofran PRN. He was able to be discharged after successful PO trial.   2. Alcohol Use Disorder Long term history of AUD with multiple bouts of pancreatitis. He has been provided resources in the past to help with cessation. CIWA with ativan was initiated and patient only required one time dose after CIWA score of 12  in the first 24 hours. CIWA scores remained low for the remainder of admission.   Discharge Vitals:   BP (!) 149/79 (BP Location: Left Arm)   Pulse 94   Temp 99 F (37.2 C) (Oral)   Resp 14   Wt 51.5 kg   SpO2 100%   BMI 17.79 kg/m   Pertinent Labs, Studies, and Procedures:   CBC Latest Ref Rng & Units 12/02/2019 12/01/2019 11/30/2019  WBC 4.0 - 10.5 K/uL 8.8 11.1(H) 12.6(H)  Hemoglobin 13.0 - 17.0 g/dL 12.3(L) 14.3 16.1  Hematocrit 39.0 - 52.0 % 38.3(L) 43.2 48.6  Platelets 150 - 400 K/uL 273 285 317   BMP Latest Ref Rng & Units 12/02/2019 12/01/2019 11/30/2019  Glucose 70 - 99 mg/dL 63(L) 95 111(H)  BUN 6 - 20 mg/dL 7 6 8   Creatinine 0.61 - 1.24 mg/dL 0.90 0.69 0.94  BUN/Creat Ratio 9 - 20 - - -  Sodium 135 - 145 mmol/L 136 135 135  Potassium 3.5 - 5.1 mmol/L 3.3(L) 3.3(L) 4.6  Chloride 98 - 111 mmol/L 98 99 104  CO2 22 - 32 mmol/L 25 25 20(L)  Calcium 8.9 - 10.3 mg/dL 8.8(L) 8.9 7.9(L)   Lipase: 475  Discharge Instructions: Discharge Instructions    Call MD for:  difficulty breathing, headache or visual disturbances   Complete by: As directed    Call MD for:  persistant dizziness or light-headedness   Complete by: As directed    Call MD for:  persistant  nausea and vomiting   Complete by: As directed    Call MD for:  severe uncontrolled pain   Complete by: As directed    Call MD for:  temperature >100.4   Complete by: As directed    Diet - low sodium heart healthy   Complete by: As directed    Discharge instructions   Complete by: As directed    Mr. Stephen, [name] were admitted to the hospital for pancreatitis due to alcohol use. Please refrain from alcohol to avoid this happening in the future. If you are worried about quitting, please talk to your primary care doctor for additional resources.   Increase activity slowly   Complete by: As directed       Signed: Dr. Jose Persia Internal Medicine PGY-1  Pager: [phone] 12/02/2019, 4:12 PM

## 2019-12-02 NOTE — Progress Notes (Signed)
   Subjective:   Pt seen at the bedside this morning. States his pain is improved this morning and he is willing to try to eat in hopes of going home soon. He has not yet been able to ambulate due to his bed alarms. No acute concerns at this time.   Objective:  Vital signs in last 24 hours: Vitals:   12/01/19 1605 12/01/19 2008 12/02/19 0305 12/02/19 0538  BP: 129/76 138/76 117/69 (!) 146/100  Pulse: 94 (!) 101  93  Resp:    14  Temp: 98.3 F (36.8 C) 98.5 F (36.9 C)  98.6 F (37 C)  TempSrc: Oral Oral  Oral  SpO2: 100% 97%  97%  Weight:    51.5 kg    Physical Exam Vitals and nursing note reviewed.  Constitutional:      General: He is not in acute distress.    Appearance: He is normal weight.  Pulmonary:     Effort: Pulmonary effort is normal. No respiratory distress.  Abdominal:     General: Bowel sounds are normal. There is no distension.     Palpations: Abdomen is soft.     Tenderness: There is abdominal tenderness (epigastric). There is no guarding.  Skin:    General: Skin is warm and dry.  Neurological:     General: No focal deficit present.     Mental Status: He is alert and oriented to person, place, and time. Mental status is at baseline.  Psychiatric:        Mood and Affect: Mood normal.        Behavior: Behavior normal.    Assessment/Plan:  Active Problems:   Pancreatitis, acute  # Acuteon ChronicPancreatitis 2/2 AlcoholUse Pain improving today. Patient is wanting to trial a diet and is interested in going home today. Will advance diet as tolerated.  - Telemetry  - IVF:LR @ 125 cc/hr till tolerating PO - Pain management withDilaudid 1 mg q3h - Zofran PRN for nausea  # Alcohol Use Disorder CIWA stable with requiring any additional Ativan. Will d/c ativan in anticipation of discharge.   - CIWA with Ativan - Folate tablet  - Thiamine tablet  # Hypertension: Improving now that pain is better controlled. Continue to monitor.    Dispo:  Anticipated discharge today or tomorrow, depending on PO tolerance.   Dr. Jose Persia Internal Medicine PGY-1  Pager: [phone] 12/02/2019, 6:48 AM

## 2019-12-02 NOTE — Progress Notes (Signed)
Patient's pain level has range from a 7-8/10.  Per patient, not much relief after pain med. is administered.  Patient has been getting pain med as needed q 3hrs.

## 2019-12-11 ENCOUNTER — Encounter: Payer: Self-pay | Admitting: Internal Medicine

## 2019-12-11 ENCOUNTER — Ambulatory Visit (INDEPENDENT_AMBULATORY_CARE_PROVIDER_SITE_OTHER): Payer: Medicare HMO | Admitting: Internal Medicine

## 2019-12-11 VITALS — BP 117/74 | HR 87 | Temp 98.4°F | Wt 122.2 lb

## 2019-12-11 DIAGNOSIS — K861 Other chronic pancreatitis: Secondary | ICD-10-CM | POA: Diagnosis not present

## 2019-12-11 DIAGNOSIS — Z7289 Other problems related to lifestyle: Secondary | ICD-10-CM

## 2019-12-11 DIAGNOSIS — K860 Alcohol-induced chronic pancreatitis: Secondary | ICD-10-CM

## 2019-12-11 MED ORDER — THIAMINE HCL 100 MG PO TABS: 100.0000 mg | ORAL_TABLET | Freq: Every day | ORAL | 3 refills | Status: DC

## 2019-12-11 MED ORDER — DICLOFENAC SODIUM 1 % EX GEL: 2.0000 g | Freq: Four times a day (QID) | CUTANEOUS | 3 refills | Status: DC

## 2019-12-11 NOTE — Assessment & Plan Note (Addendum)
Patient was admitted 1/23-1/25 for acute pancreatitis thought to be secondary to alcohol use.  He was managed conservatively with pain medication, bowel rest, and IV fluids.  He reports doing well since discharge.  Abdominal pain is resolved as well as nausea and vomiting.  Appetite has been slowly improving.  I counseled him on alcohol cessation. He reported his last drink was 3 weeks ago.  Denies any recent relapses after discharge. I congratulated him.  I offered naltrexone to help with the cravings, but he declined. He did say he will think about it and call if he decides he would like to start taking it. Refilled thiamine per patient request.

## 2019-12-11 NOTE — Patient Instructions (Addendum)
Stephen [name],  I am glad you are feeling better.  Congratulations on your abstinence from alcohol!  Keep up the good work!  I sent a prescription for the gel for pain as well as thiamine.  Call us if you have any questions or concerns.  - Dr. Frederico Hamman

## 2019-12-11 NOTE — Progress Notes (Signed)
   CC: Hospital follow up for acute pancreatitis   HPI:  Mr.Kaleb A Toller is a 59 y.o. year-old male with PMH listed below who presents to clinic for hospital follow up for acute pancreatitis. Please see problem based assessment and plan for further details.   Past Medical History:  Diagnosis Date  . Arthritis   . GERD (gastroesophageal reflux disease)   . Pancreatitis   . Tuberculosis    yrs. ago pt. states he was dx. here at [facility]   Review of Systems:   Review of Systems  Constitutional: Negative for chills, fever, malaise/fatigue and weight loss.  Gastrointestinal: Negative for abdominal pain, blood in stool, constipation, diarrhea, melena, nausea and vomiting.    Physical Exam:  Vitals:   12/11/19 1039  BP: 117/74  Pulse: 87  Temp: 98.4 F (36.9 C)  TempSrc: Oral  SpO2: 100%  Weight: 122 lb 3.2 oz (55.4 kg)    General: thin male, disheveled, in NAD  Cardiac: RRR, no mrg  Abd: soft, NTND, bowel sounds are normoactive   Assessment & Plan:   See Encounters Tab for problem based charting.  Patient discussed with Dr. Lynnae January

## 2019-12-12 NOTE — Progress Notes (Signed)
Internal Medicine Clinic Attending  Case discussed with Dr. Santos at the time of the visit.  We reviewed the resident's history and exam and pertinent patient test results.  I agree with the assessment, diagnosis, and plan of care documented in the resident's note.    

## 2019-12-16 ENCOUNTER — Telehealth: Payer: Self-pay | Admitting: Internal Medicine

## 2019-12-16 NOTE — Telephone Encounter (Signed)
Please advise Sending to dr Sheilah Pigeon and dr Sheppard Coil

## 2019-12-16 NOTE — Telephone Encounter (Signed)
Medication Request for oxycodone  [id number] - [place], [place] [address]

## 2019-12-17 NOTE — Telephone Encounter (Signed)
I called Stephen [name] but went it went to voicemail. I left a HIPPA compliant message stating that I understand he was requesting medications, and I am happy to discuss it with him when we get a chance to talk.   I understand Stephen [name] is requesting more oxycodone after completing a short course for his recent hospitalization (1/23-1/25)  for alcohol induced pancreatitis. Stephen [name] had an hospital follow up visit with Dr. Isac Sarna on 2/3, and reported he was doing well since discharge and his abdominal pain had resolved, and his appetite was improving. I do not think more  oxycodone is indicated at this time.   Stephen Plater, MD Internal Medicine, PGY1 Pager: [phone]  12/17/2019,12:55 PM

## 2019-12-23 IMAGING — CT CT ANGIO CHEST-ABD-PELV FOR DISSECTION W/ AND WO/W CM
2 of 7 series · 14 of 46 positions shown, 16 images · IV contrast (iopamidol)
Comparison: Abdomen and pelvis CT 08/12/2018.  Chest CT 10/23/2004

CLINICAL DATA: Severe back and chest pain with aortic dissection
suspected

EXAM:
CT ANGIOGRAPHY CHEST, ABDOMEN AND PELVIS
TECHNIQUE: Multidetector CT imaging through the chest, abdomen and pelvis was
performed using the standard protocol during bolus administration of
intravenous contrast. Multiplanar reconstructed images and MIPs were
obtained and reviewed to evaluate the vascular anatomy.
CONTRAST:  100mL [id number] IOPAMIDOL (JK9RQX-5VD) INJECTION 76%

[Series 7: dissection 2mm · axial · 0.72mm/px · z∈[-598,-22]mm · 11 of 325 slices shown, 13 images]
[im 19/325  soft-tissue]
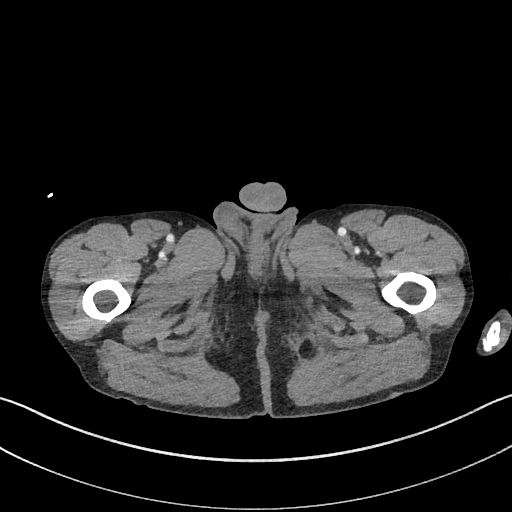
[im 19/325  bone]
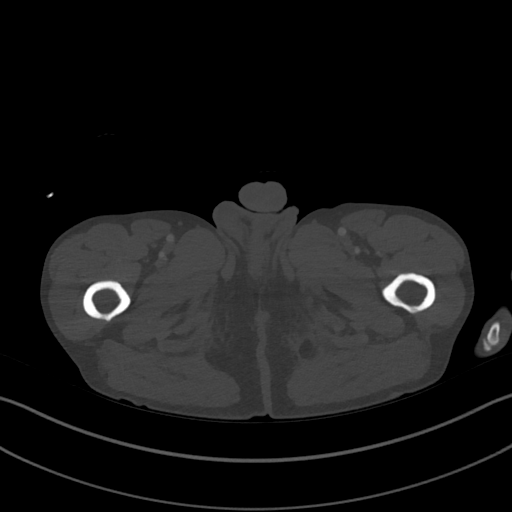
[im 55/325  soft-tissue]
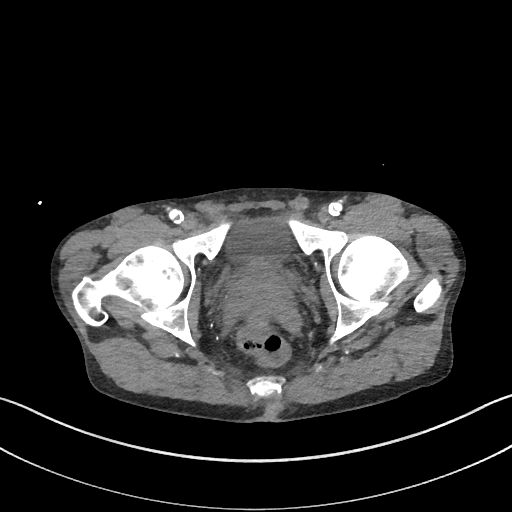
[im 73/325  soft-tissue]
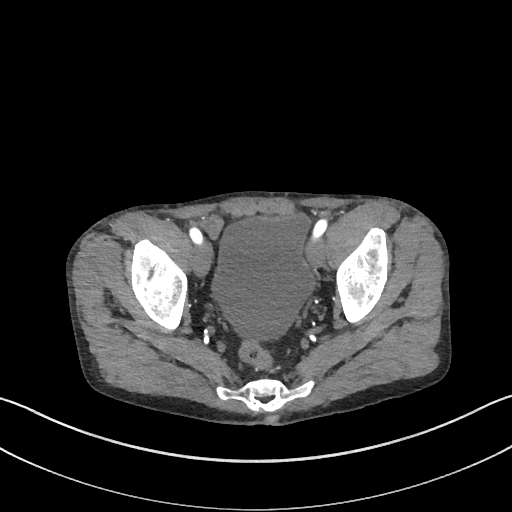
[im 109/325  soft-tissue]
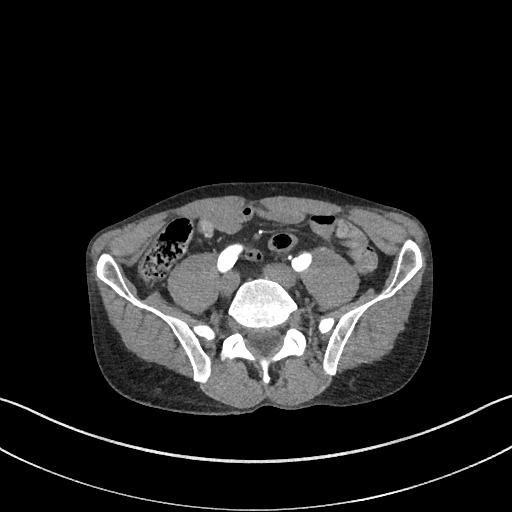
[im 127/325  soft-tissue]
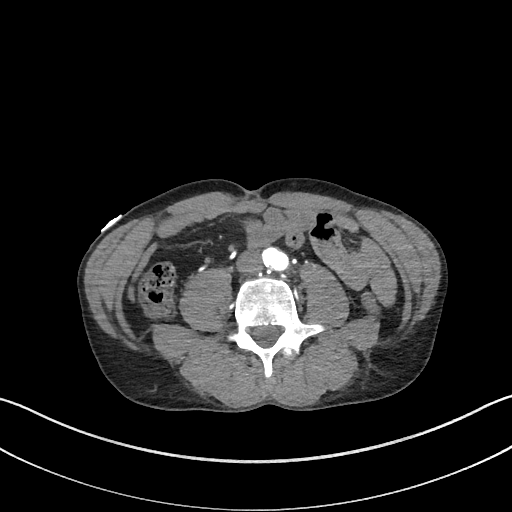
[im 163/325  soft-tissue]
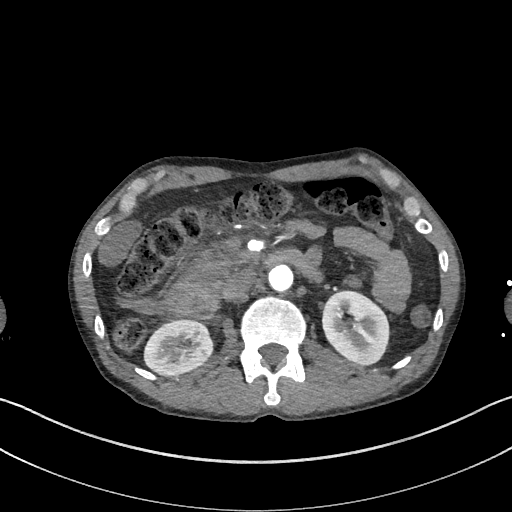
[im 199/325  soft-tissue]
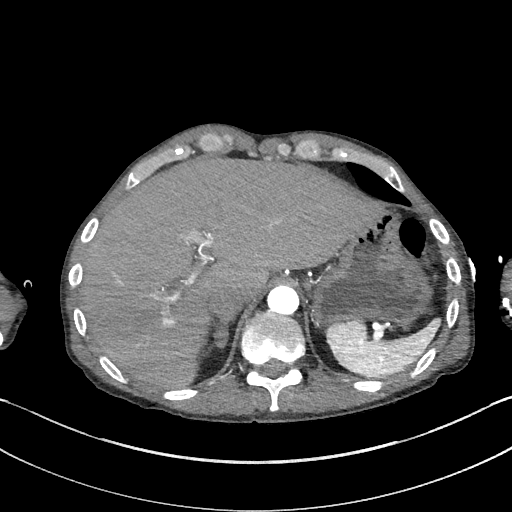
[im 217/325  soft-tissue]
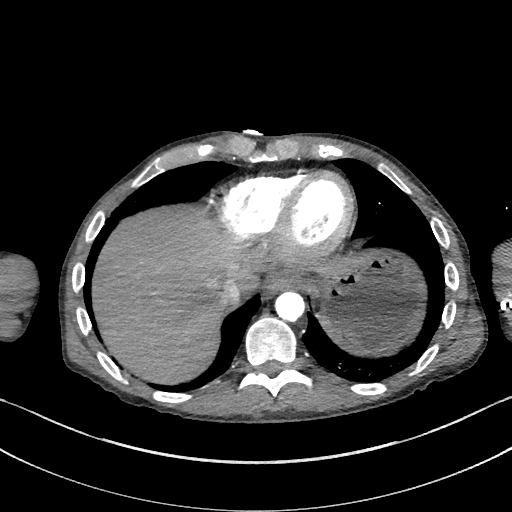
[im 253/325  soft-tissue]
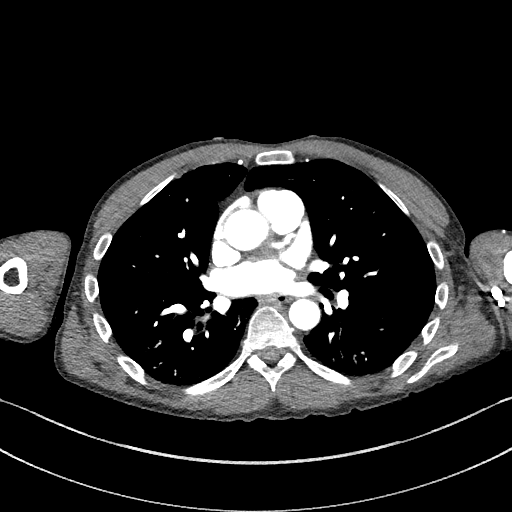
[im 253/325  bone]
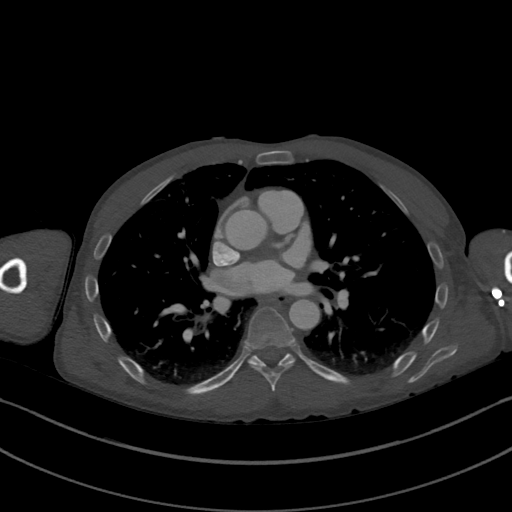
[im 271/325  soft-tissue]
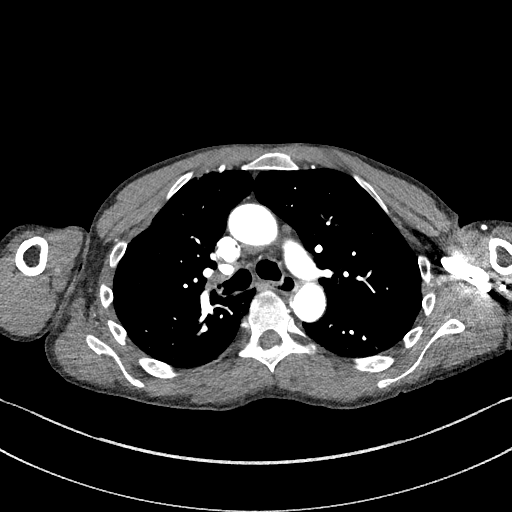
[im 307/325  soft-tissue]
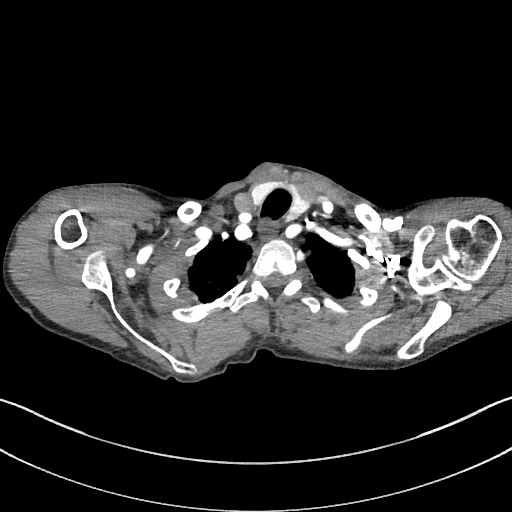

[Series 10: dissection 2mm cor · coronal · 0.95mm/px · 3 of 135 slices shown]
[im 34/135  soft-tissue]
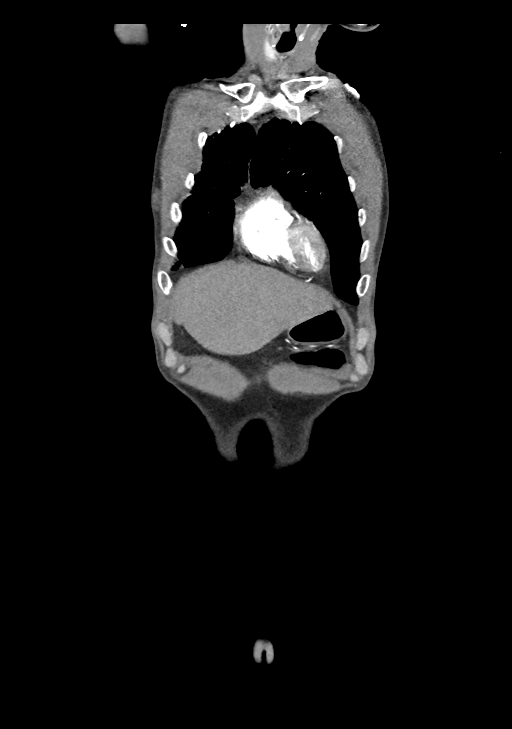
[im 68/135  soft-tissue]
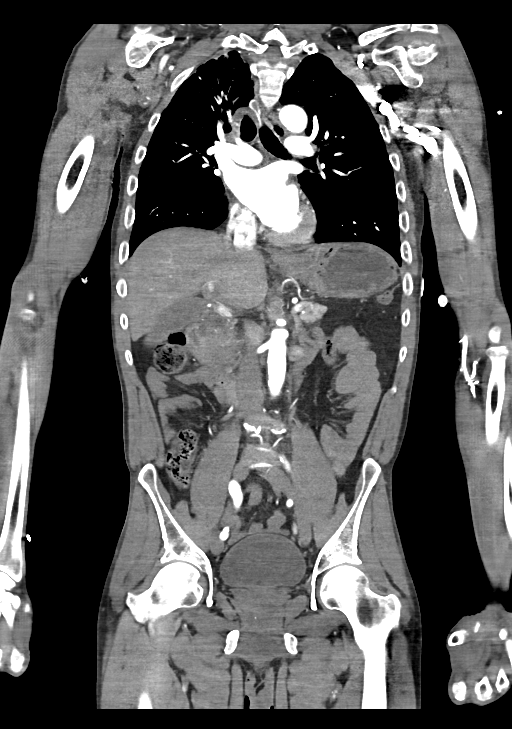
[im 101/135  soft-tissue]
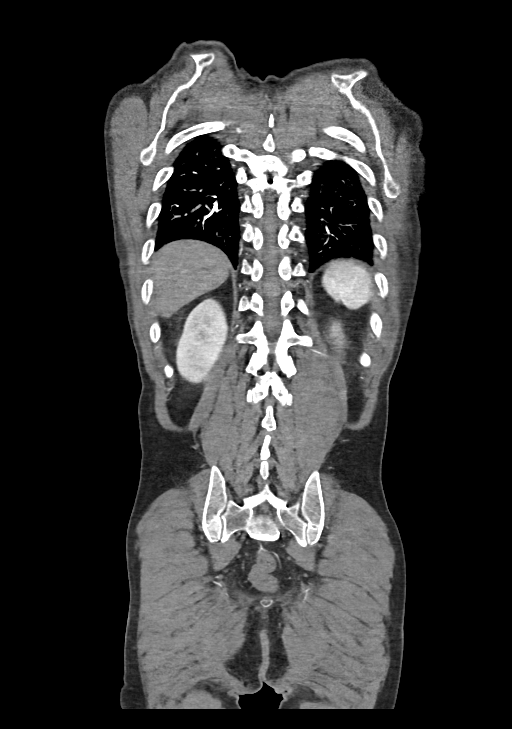

[14 of 46 positions shown; findings below may reference images not displayed]

FINDINGS: CTA CHEST FINDINGS

Cardiovascular: Noncontrast phase shows no intramural hematoma in
the aorta. No aortic dissection or aneurysm. Normal heart size. No
pericardial effusion. The pulmonary arteries are well opacified and
no filling defect is seen.

Mediastinum/Nodes: Negative for adenopathy or mass.

Lungs/Pleura: Moderate debris within right lower lobe bronchi. There
is scarring, architectural distortion, and calcification at the
right apex, post infectious changes correlating with 7339 chest CT.

Musculoskeletal: Avascular necrosis of the left humeral head.
Bilateral humeral osteoarthritis.

Review of the MIP images confirms the above findings.

CTA ABDOMEN AND PELVIS FINDINGS

VASCULAR

Aorta: Atherosclerotic plaque. No dissection, inflammation, or
aneurysm.

Celiac: Smooth and widely patent branches. No pseudoaneurysm at the
patient's pancreatitis.

SMA: Vessels are smooth and widely patent

Renals: Single bilateral renal arteries. Both renal arteries are
smooth and widely patent

IMA: Diffusely patent

Inflow: Atherosclerotic plaque at the bilateral common femoral
bifurcations. No branch occlusion or dissection.

Veins: Negative

Review of the MIP images confirms the above findings.

NON-VASCULAR

Hepatobiliary: Large caudate and left lobe liver. No definite
surface lobulation.No evidence of biliary obstruction or stone.

Pancreas: Pancreatic head is expanded and low-density with
peripancreatic stranding. Low-density seen previously is not well
identified either due to regression or to contrast timing.
Evaluation for pancreatic necrosis is limited by arterial timing.

Spleen: Unremarkable.

Adrenals/Urinary Tract: Negative adrenals. No hydronephrosis or
stone. Unremarkable bladder.

Stomach/Bowel: No obstruction. Diverticulosis of the terminal ileum.
No acute inflammation.

Lymphatic: No mass or adenopathy.

Reproductive:Negative

Other: No ascites or pneumoperitoneum.

Musculoskeletal: No acute abnormalities. Avascular necrosis of both
femoral heads.

Review of the MIP images confirms the above findings.
IMPRESSION: 1. Acute pancreatitis.
2. Negative for aortic dissection.
3. Post infectious right upper lobe scarring.
4. Mucoid impaction within the right lower lobe.
5. Chronic avascular necrosis of the left humeral and bilateral
femoral heads.

## 2019-12-25 ENCOUNTER — Telehealth: Payer: Self-pay | Admitting: Internal Medicine

## 2019-12-25 NOTE — Telephone Encounter (Signed)
RTC to patient at number provided, message received "your call cannot be completed at this time, please try your call again later".  Will forward to PCP to advise if pt needs to be seen. SChaplin, RN,BSN

## 2019-12-25 NOTE — Telephone Encounter (Signed)
Pt would like some pain medicine for his arthritis; pls contact [phone]

## 2019-12-25 NOTE — Telephone Encounter (Signed)
Sending to dr's santos-sanchez, alexander and dean- ortho

## 2019-12-26 ENCOUNTER — Other Ambulatory Visit: Payer: Self-pay | Admitting: Orthopedic Surgery

## 2019-12-26 NOTE — Telephone Encounter (Signed)
I don't think Stephen [name] needs to be seen, although I'm happy to see him if he would like to schedule an appointment.

## 2019-12-26 NOTE — Telephone Encounter (Signed)
PLS ADVISE. THANKS

## 2019-12-27 ENCOUNTER — Other Ambulatory Visit: Payer: Self-pay

## 2019-12-27 NOTE — Telephone Encounter (Signed)
Refill inappropriate

## 2019-12-27 NOTE — Telephone Encounter (Signed)
Requesting hydrocodone to be [facility] @  [id number] [place], [place] [phone] (Phone) [phone] (Fax)

## 2019-12-30 NOTE — Telephone Encounter (Signed)
Pt called and states he has pain from his shoulder surgery, he is advised to make appt w/ dr dean's office asap. Offered appt w/ pcp, refused

## 2020-01-03 ENCOUNTER — Ambulatory Visit (INDEPENDENT_AMBULATORY_CARE_PROVIDER_SITE_OTHER): Payer: Medicare HMO | Admitting: Internal Medicine

## 2020-01-03 ENCOUNTER — Other Ambulatory Visit: Payer: Self-pay

## 2020-01-03 DIAGNOSIS — M79641 Pain in right hand: Secondary | ICD-10-CM | POA: Diagnosis not present

## 2020-01-03 DIAGNOSIS — G8929 Other chronic pain: Secondary | ICD-10-CM | POA: Diagnosis not present

## 2020-01-03 DIAGNOSIS — M25511 Pain in right shoulder: Secondary | ICD-10-CM

## 2020-01-03 DIAGNOSIS — M79642 Pain in left hand: Secondary | ICD-10-CM

## 2020-01-03 NOTE — Assessment & Plan Note (Signed)
[name] was contacted today for a telehealth visit regarding chronic bilateral shoulder pain. He endorses radiation of pain bilaterally to hands that has been present since original surgery. He denies change in nature of pain but feels his hand pain may have slightly worsened in intensity lately. He is frustrated that his Robaxin, Meloxicam and Voltaren gel are not providing adequate pain relief. He states Hydrocodone has worked well for him in the past.   Patient denies following up with surgery. At this point in the conversation, Mc.Mcray became angry with being asked similar questions to previous providers. He began yelling, using profanity toward resident, stating we are not doing anything for him. He hung up.   Given that HPI was incomplete, no new medications or refills provided today.

## 2020-01-03 NOTE — Progress Notes (Signed)
Internal Medicine Clinic Attending  Case discussed with Dr. Basaraba at the time of the visit.  We reviewed the resident's history and exam and pertinent patient test results.  I agree with the assessment, diagnosis, and plan of care documented in the resident's note.    

## 2020-01-03 NOTE — Progress Notes (Signed)
  [facility] Telephone Encounter Continuity Care Appointment  HPI:   This telephone encounter was created for [name] on 01/03/2020 for the following purpose/cc bilateral hand pain.  The pain from shoulders traveling to the hands bilaterally. It has been occurring for since surgery for shoulders, but it has gotten worse in his hands. He endorses tingling but this is chronic as well. Moving helps the pain somewhat. He has been using Methocarbomol TID, Meloxicam, and Voltaren gel.    When asked regarding follow up with surgeon, [name] became angry that he was being asked similar questions that providers have asked in the past. Apology provided for asking repetitive questions. He began yelling, using profanity towards resident, and stated that we are not doing anything for him. [name] stated he was hanging up.     Past Medical History:  Past Medical History:  Diagnosis Date  . Arthritis   . GERD (gastroesophageal reflux disease)   . Pancreatitis   . Tuberculosis    yrs. ago pt. states he was dx. here at [facility]      ROS:   + chronic bilateral shoulder and hand pain  + chronic bilateral hand tingling without change in nature   - numbness   Assessment / Plan / Recommendations:   Please see A&P under problem oriented charting for assessment of the patient's acute and chronic medical conditions.   As always, pt is advised that if symptoms worsen or new symptoms arise, they should go to an urgent care facility or to to ER for further evaluation.   Consent and Medical Decision Making:   Patient discussed with Dr. Lynnae January  This is a telephone encounter between Providence Lanius and Jose Persia on 01/03/2020 for bilateral hand pain. The visit was conducted with the patient located at home and Jose Persia at [facility]. The patient's identity was confirmed using their DOB and current address. The patient has consented to being evaluated through a  telephone encounter and understands the associated risks (an examination cannot be done and the patient may need to come in for an appointment) / benefits (allows the patient to remain at home, decreasing exposure to coronavirus). I personally spent 8 minutes on medical discussion.

## 2020-01-13 ENCOUNTER — Telehealth: Payer: Self-pay | Admitting: Internal Medicine

## 2020-01-13 NOTE — Telephone Encounter (Signed)
I spoke to Stephen [name] on the phone today. We confirmed that he has an appointment next week and is looking forward to seeing me to discuss his chronic pain issues. I asked him why there has been friction between him and other providers recently, and he states that he feels like his pain is being undertreated. He states he has been as far as his past inappropriate behavior, I told him that he must be respectful of all the healthcare providers, and cursing and yelling at people is not acceptable. I also stated I am happy to further discuss his pain at the next appointment. Patient says he understands and then subsequently ended the phone call. I will follow up with the patient on 3/15.  Earlene Plater, MD Internal Medicine, PGY1 Pager: [phone]  01/13/2020,1:11 PM

## 2020-01-20 ENCOUNTER — Ambulatory Visit (INDEPENDENT_AMBULATORY_CARE_PROVIDER_SITE_OTHER): Payer: Medicare HMO | Admitting: Internal Medicine

## 2020-01-20 VITALS — BP 156/87 | HR 101 | Temp 98.3°F | Ht 67.0 in | Wt 117.4 lb

## 2020-01-20 DIAGNOSIS — D179 Benign lipomatous neoplasm, unspecified: Secondary | ICD-10-CM | POA: Insufficient documentation

## 2020-01-20 DIAGNOSIS — M19012 Primary osteoarthritis, left shoulder: Secondary | ICD-10-CM | POA: Diagnosis not present

## 2020-01-20 DIAGNOSIS — G5603 Carpal tunnel syndrome, bilateral upper limbs: Secondary | ICD-10-CM

## 2020-01-20 DIAGNOSIS — D170 Benign lipomatous neoplasm of skin and subcutaneous tissue of head, face and neck: Secondary | ICD-10-CM

## 2020-01-20 DIAGNOSIS — I10 Essential (primary) hypertension: Secondary | ICD-10-CM

## 2020-01-20 DIAGNOSIS — M19019 Primary osteoarthritis, unspecified shoulder: Secondary | ICD-10-CM

## 2020-01-20 DIAGNOSIS — Z79899 Other long term (current) drug therapy: Secondary | ICD-10-CM

## 2020-01-20 DIAGNOSIS — D1739 Benign lipomatous neoplasm of skin and subcutaneous tissue of other sites: Secondary | ICD-10-CM | POA: Diagnosis not present

## 2020-01-20 DIAGNOSIS — Z23 Encounter for immunization: Secondary | ICD-10-CM | POA: Insufficient documentation

## 2020-01-20 DIAGNOSIS — M19011 Primary osteoarthritis, right shoulder: Secondary | ICD-10-CM

## 2020-01-20 MED ORDER — DICLOFENAC SODIUM 1 % EX GEL: 4.0000 g | Freq: Four times a day (QID) | CUTANEOUS | 3 refills | Status: DC

## 2020-01-20 NOTE — Assessment & Plan Note (Signed)
Patient does not have a history of hypertension documented in the past, and has had normotensive readings.  Today, he was hypertensive to the low 160s in mid 150s on recheck.  Patient does not have a blood pressure monitor at home, but is amenable to purchasing one to start a blood pressure journal the next 4 weeks prior to his follow-up -Will consider starting HCTZ at the next visit if patient's blood pressure continues to be elevated

## 2020-01-20 NOTE — Assessment & Plan Note (Signed)
Patient states his bilateral shoulder pain has improved since surgery, but is still present.  He describes it as a 6-7/10 in severity but the Voltaren gel helps.  Asking for refill today.  I also counseled the patient that he may take as needed ibuprofen (w/ food) and Tylenol to help manage his shoulder pain.  Patient is in agreement.  -Refill Voltaren gel -Encourage patient to take as needed ibuprofen and Tylenol

## 2020-01-20 NOTE — Assessment & Plan Note (Addendum)
Patient states that these new bumps/growths on his right maxilla and left groin region are not bothersome, and not painful.  On physical exam, the growths are soft, mobile, and nontender. Appeared to be consistent with lipomas.  We offered to refer the patient to a dermatologist for further management if he wanted them removed, but he states that they are not bothering him at this time and will continue to follow-up with it. -Continue to monitor -Refer to dermatology if lipomas become bothersome

## 2020-01-20 NOTE — Progress Notes (Signed)
   CC: Hand pain  HPI:  [name] is a 59 y.o. with a past medical history as noted below who presents today for bilateral hand pain. Please refer to the problem based charting for details.   Colonoscopy - never completed  Past Medical History:  Diagnosis Date  . Arthritis   . GERD (gastroesophageal reflux disease)   . Pancreatitis   . Tuberculosis    yrs. ago pt. states he was dx. here at [facility]   Review of Systems: All systems were reviewed and are otherwise negative unless mentioned in HPI  Physical Exam: Vitals:   01/20/20 1411 01/20/20 1439  BP: (!) 162/82 (!) 156/87  Pulse: (!) 106 (!) 101  Temp: 98.3 F (36.8 C)   TempSrc: Oral   SpO2: 99%   Weight: 117 lb 6.4 oz (53.3 kg)   Height: 5\' 7"  (1.702 m)    Physical Exam Vitals reviewed.  Constitutional:      Appearance: Normal appearance.     Comments: Thin appearing   HENT:     Head: Atraumatic.     Comments: Soft, mobile, nontender 3x3 cm mass located upon the R maxilla Eyes:     Extraocular Movements: Extraocular movements intact.  Cardiovascular:     Rate and Rhythm: Regular rhythm. Tachycardia present.     Pulses: Normal pulses.     Heart sounds: Normal heart sounds. No murmur. No friction rub. No gallop.   Pulmonary:     Effort: Pulmonary effort is normal. No respiratory distress.     Breath sounds: Normal breath sounds. No wheezing or rales.  Abdominal:     General: Abdomen is flat. Bowel sounds are normal. There is no distension.     Palpations: Abdomen is soft.     Tenderness: There is no abdominal tenderness. There is no guarding.  Musculoskeletal:        General: Normal range of motion.     Comments: Soft, mobile, nontender 3cm x 3cm mass located upon L groin   Skin:    General: Skin is warm.  Neurological:     General: No focal deficit present.     Mental Status: He is alert and oriented to person, place, and time.     Comments: +Phalen and + Tinel test bilaterally   Psychiatric:        Mood and Affect: Mood normal.        Behavior: Behavior normal.    Assessment & Plan:   See Encounters Tab for problem based charting.  Patient seen with Dr. Rebeca Alert

## 2020-01-20 NOTE — Assessment & Plan Note (Addendum)
Patient is overdue for his Tdap vaccine. -Will administer at next visit

## 2020-01-20 NOTE — Assessment & Plan Note (Signed)
Patient states he has had bilateral hand numbness, tingling in pin prick sensation in his hands since his last surgery.  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  He had a positive Phalen and Tennille's test on physical exam, exacerbated his symptoms in bilateral median nerve distributions. -Encourage patient to purchase bilateral hand splints to wear at night -Reassess in 4 weeks

## 2020-01-20 NOTE — Patient Instructions (Addendum)
Thank you for allowing [place] to take care of you today.  Below is a summary of what we discussed:  1.  Carpal tunnel -I think the tingling and pain you are experiencing your hand is due to your nerve being compressed.  I recommend picking up a splint for both your wrists, and using them at nighttime.  2.  Shoulder pain -You may need ibuprofen and Tylenol as needed for the pain.  Make sure you eat some food before you take the ibuprofen.  3. HTN -Your blood pressure was elevated at clinic today.  Please write your blood pressures every few days in a journal and bring it to your next clinic appointment so we can follow-up on it  4.  Follow-up -Please do a follow-up appointment in 4 weeks.  If you have any questions or concerns in the meantime, please feel free to reach out to [place].

## 2020-02-09 ENCOUNTER — Encounter (HOSPITAL_COMMUNITY): Payer: Self-pay | Admitting: Emergency Medicine

## 2020-02-09 ENCOUNTER — Other Ambulatory Visit: Payer: Self-pay

## 2020-02-09 ENCOUNTER — Emergency Department (HOSPITAL_COMMUNITY)
Admission: EM | Admit: 2020-02-09 | Discharge: 2020-02-10 | Disposition: A | Discharge status: Home / Self Care | Payer: No Typology Code available for payment source | Attending: Emergency Medicine | Admitting: Emergency Medicine

## 2020-02-09 DIAGNOSIS — Z96612 Presence of left artificial shoulder joint: Secondary | ICD-10-CM | POA: Diagnosis not present

## 2020-02-09 DIAGNOSIS — Z79899 Other long term (current) drug therapy: Secondary | ICD-10-CM | POA: Diagnosis not present

## 2020-02-09 DIAGNOSIS — R1010 Upper abdominal pain, unspecified: Secondary | ICD-10-CM | POA: Diagnosis present

## 2020-02-09 DIAGNOSIS — I10 Essential (primary) hypertension: Secondary | ICD-10-CM | POA: Diagnosis not present

## 2020-02-09 DIAGNOSIS — Z96611 Presence of right artificial shoulder joint: Secondary | ICD-10-CM | POA: Diagnosis not present

## 2020-02-09 DIAGNOSIS — Z7982 Long term (current) use of aspirin: Secondary | ICD-10-CM | POA: Diagnosis not present

## 2020-02-09 DIAGNOSIS — R7309 Other abnormal glucose: Secondary | ICD-10-CM | POA: Diagnosis not present

## 2020-02-09 DIAGNOSIS — F17210 Nicotine dependence, cigarettes, uncomplicated: Secondary | ICD-10-CM | POA: Diagnosis not present

## 2020-02-09 DIAGNOSIS — K852 Alcohol induced acute pancreatitis without necrosis or infection: Secondary | ICD-10-CM | POA: Insufficient documentation

## 2020-02-09 LAB — COMPREHENSIVE METABOLIC PANEL
ALT: 27 U/L (ref 0–44)
AST: 30 U/L (ref 15–41)
Albumin: 4 g/dL (ref 3.5–5.0)
Alkaline Phosphatase: 107 U/L (ref 38–126)
Anion gap: 12 (ref 5–15)
BUN: 8 mg/dL (ref 6–20)
CO2: 24 mmol/L (ref 22–32)
Calcium: 9.5 mg/dL (ref 8.9–10.3)
Chloride: 100 mmol/L (ref 98–111)
Creatinine, Ser: 0.94 mg/dL (ref 0.61–1.24)
GFR calc Af Amer: >60 mL/min (ref >60)
GFR calc non Af Amer: >60 mL/min (ref >60)
Glucose, Bld: 142 mg/dL — ABNORMAL HIGH (ref 70–99)
Potassium: 3.7 mmol/L (ref 3.5–5.1)
Sodium: 136 mmol/L (ref 135–145)
Total Bilirubin: 0.8 mg/dL (ref 0.3–1.2)
Total Protein: 8 g/dL (ref 6.5–8.1)

## 2020-02-09 LAB — URINALYSIS, ROUTINE W REFLEX MICROSCOPIC
Bacteria, UA: NONE SEEN
Bilirubin Urine: NEGATIVE
Glucose, UA: NEGATIVE mg/dL
Ketones, ur: 5 mg/dL — AB
Leukocytes,Ua: NEGATIVE
Nitrite: NEGATIVE
Protein, ur: 100 mg/dL — AB
Specific Gravity, Urine: 1.027 (ref 1.005–1.030)
pH: 5 (ref 5.0–8.0)

## 2020-02-09 LAB — CBC
HCT: 45.4 % (ref 39.0–52.0)
Hemoglobin: 15 g/dL (ref 13.0–17.0)
MCH: 27.9 pg (ref 26.0–34.0)
MCHC: 33 g/dL (ref 30.0–36.0)
MCV: 84.5 fL (ref 80.0–100.0)
Platelets: 343 10*3/uL (ref 150–400)
RBC: 5.37 MIL/uL (ref 4.22–5.81)
RDW: 14.5 % (ref 11.5–15.5)
WBC: 11.7 10*3/uL — ABNORMAL HIGH (ref 4.0–10.5)
nRBC: 0 % (ref 0.0–0.2)

## 2020-02-09 LAB — LIPASE, BLOOD: Lipase: 440 U/L — ABNORMAL HIGH (ref 11–51)

## 2020-02-09 MED ORDER — HYDROMORPHONE HCL 1 MG/ML IJ SOLN
1.0000 mg | Freq: Once | INTRAMUSCULAR | Status: AC
  Administered 2020-02-09: 1 mg via INTRAVENOUS
  Filled 2020-02-09: qty 1

## 2020-02-09 MED ORDER — SODIUM CHLORIDE 0.9% FLUSH
3.0000 mL | Freq: Once | INTRAVENOUS | Status: AC
  Administered 2020-02-09: 3 mL via INTRAVENOUS

## 2020-02-09 MED ORDER — PROMETHAZINE HCL 25 MG/ML IJ SOLN
12.5000 mg | Freq: Once | INTRAMUSCULAR | Status: AC
  Administered 2020-02-09: 12.5 mg via INTRAVENOUS
  Filled 2020-02-09: qty 1

## 2020-02-09 MED ORDER — ONDANSETRON 4 MG PO TBDP
8.0000 mg | ORAL_TABLET | Freq: Once | ORAL | Status: AC
  Administered 2020-02-09: 8 mg via ORAL
  Filled 2020-02-09: qty 2

## 2020-02-09 MED ORDER — LACTATED RINGERS IV BOLUS
1000.0000 mL | Freq: Once | INTRAVENOUS | Status: AC
  Administered 2020-02-09: 1000 mL via INTRAVENOUS

## 2020-02-09 NOTE — ED Notes (Signed)
Pt aware of need for urine sample, urinal at bedside 

## 2020-02-09 NOTE — ED Triage Notes (Signed)
C/o lower back pain, generalized abd pain, and SOB.  Vomited x 1 this morning.

## 2020-02-10 MED ORDER — MORPHINE SULFATE (PF) 4 MG/ML IV SOLN
4.0000 mg | Freq: Once | INTRAVENOUS | Status: AC
  Administered 2020-02-10: 4 mg via INTRAVENOUS
  Filled 2020-02-10: qty 1

## 2020-02-10 MED ORDER — FAMOTIDINE IN NACL 20-0.9 MG/50ML-% IV SOLN
20.0000 mg | Freq: Once | INTRAVENOUS | Status: AC
  Administered 2020-02-10: 20 mg via INTRAVENOUS
  Filled 2020-02-10: qty 50

## 2020-02-10 MED ORDER — PROMETHAZINE HCL 25 MG RE SUPP: 25.0000 mg | Freq: Four times a day (QID) | RECTAL | 0 refills | Status: DC | PRN

## 2020-02-10 MED ORDER — OXYCODONE HCL 5 MG PO TABS: 5.0000 mg | ORAL_TABLET | ORAL | 0 refills | Status: DC | PRN

## 2020-02-10 NOTE — Discharge Instructions (Signed)
Return if pain or nausea are not being adequately controlled at home.  If you drink any alcohol, your pain will get worse.  Your blood sugar today was a little high, and it has been high in the past.  You will need to follow-up with the internal medicine clinic.  If your blood sugar stays high, you may need to start taking medication for diabetes.

## 2020-02-10 NOTE — ED Provider Notes (Signed)
Care assumed from Dr. Wilson Singer, patient with alcoholic pancreatitis, getting pain medication  He will be observed to see if he gets adequate pain relief and can be discharged, or whether he will need to be admitted for ongoing pain control.  2:41 AM Patient is resting comfortably and has not needed additional pain medication.  He has not had any further vomiting.  He feels comfortable going home.  He is discharged with prescriptions for oxycodone and promethazine and is referred back to the internal medicine clinic for follow-up.  Incidental finding of elevated random glucose.  He has had elevated glucose levels in the past and may be in the early stages of developing type 2 diabetes.   Delora Fuel, MD [id number] [phone]

## 2020-02-10 NOTE — ED Notes (Signed)
Patient verbalizes understanding of discharge instructions. Opportunity for questioning and answers were provided. Armband removed by staff, pt discharged from ED. Pt. ambulatory and discharged home.  

## 2020-02-10 NOTE — ED Provider Notes (Signed)
[facility] EMERGENCY DEPARTMENT Provider Note   CSN: [id number] Arrival date & time: 02/09/20  1807     History Chief Complaint  Patient presents with  . Abdominal Pain  . Shortness of Breath  . Back Pain    Stephen [name] is a 59 y.o. male.  HPI   59 year old male with abdominal pain. Associated with nausea and vomiting. Onset this morning. Went to sleep last night more or less in his usual state of health. Pain is in the upper abdomen. Radiates into his back. Constant. No urinary complaints. No change in his bowel movements. No fevers or chills. Drinks beer daily.  Past Medical History:  Diagnosis Date  . Arthritis   . GERD (gastroesophageal reflux disease)   . Pancreatitis   . Tuberculosis    yrs. ago pt. states he was dx. here at [facility]    Patient Active Problem List   Diagnosis Date Noted  . Carpal tunnel syndrome, bilateral 01/20/2020  . Need for diphtheria-tetanus-pertussis (Tdap) vaccine 01/20/2020  . Hypertension 01/20/2020  . Lipoma 01/20/2020  . Pancreatitis, acute 11/30/2019  . Rotator cuff arthropathy 09/03/2019  . Hypokalemia 05/12/2019  . Rotator cuff arthropathy of left shoulder   . Shoulder arthritis 12/11/2018  . Hepatitis C antibody test positive 01/19/2018  . Chronic pain of both shoulders 01/17/2018  . Screening for colon cancer 01/17/2018  . Sinus congestion 01/17/2018  . Malnutrition of moderate degree 01/09/2018  . Chronic pancreatitis ([place]) 01/06/2018  . Hyponatremia 01/06/2018    Past Surgical History:  Procedure Laterality Date  . FRACTURE SURGERY     jaw  . REVERSE SHOULDER ARTHROPLASTY Left 12/11/2018   Procedure: LEFT REVERSE SHOULDER ARTHROPLASTY;  Surgeon: Meredith Pel, MD;  Location: [place];  Service: Orthopedics;  Laterality: Left;  . REVERSE SHOULDER ARTHROPLASTY Right 09/03/2019  . REVERSE SHOULDER ARTHROPLASTY Right 09/03/2019   Procedure: RIGHT REVERSE SHOULDER ARTHROPLASTY;  Surgeon: Meredith Pel, MD;  Location: [place];  Service: Orthopedics;  Laterality: Right;       No family history on file.  Social History   Tobacco Use  . Smoking status: Current Every Day Smoker    Years: 40.00    Types: Cigarettes  . Smokeless tobacco: Never Used  . Tobacco comment: 1 cigarette per day  Substance Use Topics  . Alcohol use: Yes    Comment: 1-2 beers daily.   . Drug use: Yes    Types: Marijuana    Comment: 1 joint every other day    Home Medications Prior to Admission medications   Medication Sig Start Date End Date Taking? Authorizing Provider  aspirin 81 MG EC tablet TAKE 1 TABLET BY MOUTH EVERY DAY Patient taking differently: Take 81 mg by mouth daily.  09/09/19   Newt Minion, MD  diclofenac Sodium (VOLTAREN) 1 % GEL Apply 4 g topically 4 (four) times daily. 01/20/20   Earlene Plater, MD  methocarbamol (ROBAXIN) 500 MG tablet TAKE 1 TABLET (500 MG TOTAL) BY MOUTH EVERY 8 (EIGHT) HOURS AS NEEDED FOR MUSCLE SPASMS. 12/26/19   Magnant, Charles L, PA-C  pantoprazole (PROTONIX) 40 MG tablet TAKE 1 TABLET BY MOUTH EVERY DAY Patient taking differently: Take 40 mg by mouth daily.  11/04/19   Earlene Plater, MD  thiamine 100 MG tablet Take 1 tablet (100 mg total) by mouth daily. 12/11/19   Welford Roche, MD    Allergies    Patient has no known allergies.  Review of Systems  Review of Systems All systems reviewed and negative, other than as noted in HPI.  Physical Exam Updated Vital Signs BP (!) 169/95 (BP Location: Left Arm)   Pulse (!) 102   Temp 98.2 F (36.8 C) (Oral)   Resp (!) 22   SpO2 100%   Physical Exam Vitals and nursing note reviewed.  Constitutional:      General: He is not in acute distress.    Appearance: He is well-developed.     Comments: Disheveled appearance. No acute distress.  HENT:     Head: Normocephalic and atraumatic.  Eyes:     General:        Right eye: No discharge.        Left eye: No discharge.     Conjunctiva/sclera:  Conjunctivae normal.  Cardiovascular:     Rate and Rhythm: Regular rhythm. Tachycardia present.     Heart sounds: Normal heart sounds. No murmur. No friction rub. No gallop.      Comments: Mild tachycardia. Pulmonary:     Effort: Pulmonary effort is normal. No respiratory distress.     Breath sounds: Normal breath sounds.  Abdominal:     General: There is no distension.     Palpations: Abdomen is soft.     Comments: Mild tenderness in the epigastrium without rebound or guarding. No distention.  Musculoskeletal:        General: No tenderness.     Cervical back: Neck supple.  Skin:    General: Skin is warm and dry.  Neurological:     Mental Status: He is alert.  Psychiatric:        Behavior: Behavior normal.        Thought Content: Thought content normal.     ED Results / Procedures / Treatments   Labs (all labs ordered are listed, but only abnormal results are displayed) Labs Reviewed  LIPASE, BLOOD - Abnormal; Notable for the following components:      Result Value   Lipase 440 (*)    All other components within normal limits  COMPREHENSIVE METABOLIC PANEL - Abnormal; Notable for the following components:   Glucose, Bld 142 (*)    All other components within normal limits  CBC - Abnormal; Notable for the following components:   WBC 11.7 (*)    All other components within normal limits  URINALYSIS, ROUTINE W REFLEX MICROSCOPIC - Abnormal; Notable for the following components:   Hgb urine dipstick SMALL (*)    Ketones, ur 5 (*)    Protein, ur 100 (*)    All other components within normal limits    EKG EKG Interpretation  Date/Time:  Sunday February 09 2020 18:32:47 EDT Ventricular Rate:  100 PR Interval:  162 QRS Duration: 74 QT Interval:  340 QTC Calculation: 438 R Axis:   74 Text Interpretation: Normal sinus rhythm Biatrial enlargement Abnormal ECG Confirmed by Virgel Manifold [phone]) on 02/09/2020 8:38:05 PM   Radiology No results  found.  Procedures Procedures (including critical care time)  Medications Ordered in ED Medications  sodium chloride flush (NS) 0.9 % injection 3 mL (3 mLs Intravenous Given 02/09/20 2319)  ondansetron (ZOFRAN-ODT) disintegrating tablet 8 mg (8 mg Oral Given 02/09/20 2022)  lactated ringers bolus 1,000 mL (1,000 mLs Intravenous New Bag/Given 02/09/20 2318)  HYDROmorphone (DILAUDID) injection 1 mg (1 mg Intravenous Given 02/09/20 2315)  promethazine (PHENERGAN) injection 12.5 mg (12.5 mg Intravenous Given 02/09/20 2315)    ED Course  I have reviewed the triage vital signs  and the nursing notes.  Pertinent labs & imaging results that were available during my care of the patient were reviewed by me and considered in my medical decision making (see chart for details).    MDM Rules/Calculators/A&P                      59 year old male with abdominal pain. Likely from etoh pancreatitis. He is focally tender in the epigastrium. Regular alcohol usage. Lipase is elevated. Tender on exam, but no peritonitis. Is afebrile. Treated with IV fluids, antiemetics and pain medication. No vomiting since being here in the emergency room. Patient will be signed out at change of shift with a plan for reassessment. His symptoms are reasonably controlled, that I feel he is appropriate for outpatient follow-up. Not, admit for symptomatic control.  Final Clinical Impression(s) / ED Diagnoses Final diagnoses:  Alcohol-induced acute pancreatitis, unspecified complication status    Rx / DC Orders ED Discharge Orders    None       Virgel Manifold, MD 02/10/20 0003

## 2020-02-11 NOTE — Progress Notes (Signed)
Internal Medicine Clinic Attending  Case discussed with Dr. Emalee Knies at the time of the visit.  We reviewed the resident's history and exam and pertinent patient test results.  I agree with the assessment, diagnosis, and plan of care documented in the resident's note.  Makel Mcmann, M.D., Ph.D.  

## 2020-02-17 ENCOUNTER — Encounter: Payer: No Typology Code available for payment source | Admitting: Internal Medicine

## 2020-03-02 ENCOUNTER — Other Ambulatory Visit: Payer: Self-pay | Admitting: Surgical

## 2020-03-02 ENCOUNTER — Other Ambulatory Visit: Payer: Self-pay | Admitting: Orthopedic Surgery

## 2020-03-02 ENCOUNTER — Encounter: Payer: Self-pay | Admitting: Internal Medicine

## 2020-03-02 ENCOUNTER — Ambulatory Visit (INDEPENDENT_AMBULATORY_CARE_PROVIDER_SITE_OTHER): Payer: Medicare HMO | Admitting: Internal Medicine

## 2020-03-02 VITALS — BP 119/78 | HR 97 | Temp 98.7°F | Ht 67.0 in | Wt 118.0 lb

## 2020-03-02 DIAGNOSIS — K860 Alcohol-induced chronic pancreatitis: Secondary | ICD-10-CM

## 2020-03-02 DIAGNOSIS — K861 Other chronic pancreatitis: Secondary | ICD-10-CM

## 2020-03-02 DIAGNOSIS — G5603 Carpal tunnel syndrome, bilateral upper limbs: Secondary | ICD-10-CM

## 2020-03-02 DIAGNOSIS — R739 Hyperglycemia, unspecified: Secondary | ICD-10-CM

## 2020-03-02 DIAGNOSIS — H538 Other visual disturbances: Secondary | ICD-10-CM | POA: Diagnosis not present

## 2020-03-02 DIAGNOSIS — H6121 Impacted cerumen, right ear: Secondary | ICD-10-CM | POA: Insufficient documentation

## 2020-03-02 NOTE — Assessment & Plan Note (Signed)
Pt continues to have bilateral hand numbness and tingling in the median nerve distribution in both hands. Pt states he was not able to find the carpel tunnel braces that we discussed at his last visit. He says he inquired about it at the [place], but they did not get back to him. He says he'll try to purchase them at [facility] instead. + Phalen test at today's visit.  -Encourage pt to wear bilateral hand splints at night -Reassess at next visit

## 2020-03-02 NOTE — Telephone Encounter (Signed)
Ok to rf? 

## 2020-03-02 NOTE — Assessment & Plan Note (Addendum)
Patient states over the last couple days he has had increased fullness sensation in his right ear, and now is starting to build up on the left.  He states that he has not had any fevers, ear drainage, or pain. On examination, he has no tenderness to the ear. There is a large impaction of cerumen to the point where I cannot appreciate the eardrum in the R ear. On the L, there is impacted cerumen partially obstructing the eardrum which appears not erythematous or nonbulging. Patient states that he has to go to the [facility] shortly, so he cannot stay for any irrigation treatment. -Suggested over the counter Waxsol or Cerumol to use since he cannot stay -F/u at next visit

## 2020-03-02 NOTE — Patient Instructions (Signed)
Thank you for visiting [place] in clinic today. Below is a summary of what we discussed:  1.  Carpal tunnel syndrome - You can purchase braces over the counter at [facility] and wear them at night while you sleep  2. Ear impaction -You have a  Lot of ear wax in your ear causing it to feel full -You can pick up over the counter Waxsol or Cerumol that can be purchased over the counter at the pharmacy. Please follow the instructions. Usually people place a few drops in each ear for 3-7 days  3. Follow up -Schedule an appointment within 1-2 months  If you have any questions or concerns, please feel free to reach out to [place]. Take care Stephen [name]!

## 2020-03-02 NOTE — Assessment & Plan Note (Addendum)
Pt states that he has recently noticed he has to hold a book further from his face in order to read it, otherwise it is blurry. He has no other visual complaints outside of that. Pt is requesting a referral to obtain reading glasses.  -Referral to optometry ordered

## 2020-03-02 NOTE — Assessment & Plan Note (Addendum)
Pt visited the ED on 4/5 with acute abdominal pain, nausea and vomiting. He had an elvated lipase to the 400s and was treated for presumed acute on chronic pancreatitis.  He received some IVF, pain medications and antimimetics then was discharged home without being admitted to the hospital with 5 days worth of Oxycodone 5 mg TID PRN. Pt completed the course and no longer has any abdominal pain, nausea or vomiting.   Of note, during this visit he was noted to be hyperglycemic to 140 at that ED visit.  Patient has no history of diabetes or hyperglycemia in the past.  In the setting of an pancreatitis flare, it is difficult to determine what to make of this, but given the fact that it was a singular incident I am inclined to follow it up at the next appointment.  Patient denies any increased urinary frequency or excessive thirst.  I have low suspicion for diabetes, although he is at an increased risk for developing it given his chronic pancreatitis. -F/u at next visit

## 2020-03-02 NOTE — Progress Notes (Signed)
   CC: Carpel tunnel follow up   HPI:  StephenStephen [name] is a 59 y.o. m history is presents today for carpel tunnel follow up. Please refer to the problem based charting for details of the visit.   Past Medical History:  Diagnosis Date  . Arthritis   . GERD (gastroesophageal reflux disease)   . Pancreatitis   . Tuberculosis    yrs. ago pt. states he was dx. here at [facility]   Review of Systems:  All systems reviewed and are otherwise negative unless mentioned in the HPI  Physical Exam:  Vitals:   03/02/20 1346  BP: 119/78  Pulse: 97  Temp: 98.7 F (37.1 C)  TempSrc: Oral  SpO2: 100%  Weight: 118 lb (53.5 kg)  Height: 5\' 7"  (1.702 m)   Physical Exam Vitals reviewed.  Constitutional:      General: He is not in acute distress.    Appearance: He is not ill-appearing or toxic-appearing.     Comments: Thin appearing    HENT:     Head: Normocephalic and atraumatic.     Right Ear: There is impacted cerumen.     Left Ear: Tympanic membrane normal. There is impacted cerumen.     Ears:     Comments: Unable to visualize R TM due to fully impacted canal    Nose: No congestion or rhinorrhea.  Eyes:     General:        Right eye: No discharge.        Left eye: No discharge.     Extraocular Movements: Extraocular movements intact.     Conjunctiva/sclera: Conjunctivae normal.  Cardiovascular:     Rate and Rhythm: Normal rate and regular rhythm.     Pulses: Normal pulses.     Heart sounds: Normal heart sounds. No murmur. No friction rub. No gallop.   Pulmonary:     Effort: Pulmonary effort is normal. No respiratory distress.     Breath sounds: Normal breath sounds. No wheezing or rales.  Abdominal:     General: Abdomen is flat. Bowel sounds are normal. There is no distension.     Palpations: Abdomen is soft.     Tenderness: There is no abdominal tenderness. There is no guarding.  Musculoskeletal:     Right lower leg: No edema.     Left lower leg: No edema.  Neurological:     Mental Status: He is alert.     Comments: Positive Phalen test    Assessment & Plan:   See Encounters Tab for problem based charting.  Patient discussed with Dr. Philipp Ovens

## 2020-03-03 NOTE — Telephone Encounter (Signed)
Pls advise. Thanks.  

## 2020-03-03 NOTE — Telephone Encounter (Signed)
Gd pt

## 2020-03-03 NOTE — Telephone Encounter (Signed)
This isn't our patient.

## 2020-03-03 NOTE — Telephone Encounter (Signed)
Pls advise.  

## 2020-03-16 ENCOUNTER — Encounter: Payer: Self-pay | Admitting: *Deleted

## 2020-03-16 NOTE — Progress Notes (Signed)
Internal Medicine Clinic Attending  Case discussed with Dr. Alexander at the time of the visit.  We reviewed the resident's history and exam and pertinent patient test results.  I agree with the assessment, diagnosis, and plan of care documented in the resident's note.  

## 2020-04-30 ENCOUNTER — Telehealth: Payer: Self-pay | Admitting: Student

## 2020-04-30 NOTE — Telephone Encounter (Signed)
-----   Message from Zola Button sent at 04/22/2020  9:24 AM EDT ----- Regarding: FW: PCP follow up  ----- Message ----- From: Earlene Plater, MD Sent: 04/21/2020   2:11 PM EDT To: Imp Front Desk Pool Subject: PCP follow up                                  Could we schedule an appointment for Stephen [name] to meet his new PCP in late July/early August? Thank you.

## 2020-06-02 ENCOUNTER — Encounter: Payer: No Typology Code available for payment source | Admitting: Student

## 2020-06-03 ENCOUNTER — Encounter: Payer: No Typology Code available for payment source | Admitting: Student

## 2020-06-03 ENCOUNTER — Encounter: Payer: Self-pay | Admitting: Student

## 2020-07-05 ENCOUNTER — Emergency Department (HOSPITAL_COMMUNITY)
Admission: EM | Admit: 2020-07-05 | Discharge: 2020-07-05 | Disposition: A | Discharge status: Home / Self Care | Payer: No Typology Code available for payment source | Attending: Emergency Medicine | Admitting: Emergency Medicine

## 2020-07-05 ENCOUNTER — Other Ambulatory Visit: Payer: Self-pay

## 2020-07-05 ENCOUNTER — Encounter (HOSPITAL_COMMUNITY): Payer: Self-pay | Admitting: Emergency Medicine

## 2020-07-05 DIAGNOSIS — R109 Unspecified abdominal pain: Secondary | ICD-10-CM | POA: Insufficient documentation

## 2020-07-05 DIAGNOSIS — R112 Nausea with vomiting, unspecified: Secondary | ICD-10-CM | POA: Diagnosis not present

## 2020-07-05 DIAGNOSIS — Z5321 Procedure and treatment not carried out due to patient leaving prior to being seen by health care provider: Secondary | ICD-10-CM | POA: Diagnosis not present

## 2020-07-05 MED ORDER — ONDANSETRON 4 MG PO TBDP
4.0000 mg | ORAL_TABLET | Freq: Once | ORAL | Status: AC | PRN
  Administered 2020-07-05: 4 mg via ORAL
  Filled 2020-07-05: qty 1

## 2020-07-05 NOTE — ED Notes (Signed)
Arvin Abello, brother, [phone].

## 2020-07-05 NOTE — ED Triage Notes (Signed)
Pt having abd pains with n/v.

## 2020-07-07 ENCOUNTER — Telehealth: Payer: Self-pay | Admitting: Student

## 2020-07-10 ENCOUNTER — Other Ambulatory Visit: Payer: Self-pay

## 2020-07-10 ENCOUNTER — Encounter: Payer: Self-pay | Admitting: Internal Medicine

## 2020-07-10 ENCOUNTER — Ambulatory Visit: Payer: No Typology Code available for payment source | Admitting: Internal Medicine

## 2020-07-10 VITALS — BP 128/78 | HR 88 | Temp 97.9°F | Ht 67.0 in | Wt 117.4 lb

## 2020-07-20 ENCOUNTER — Encounter: Payer: Self-pay | Admitting: Internal Medicine

## 2020-07-20 ENCOUNTER — Other Ambulatory Visit: Payer: Self-pay

## 2020-07-20 ENCOUNTER — Ambulatory Visit (INDEPENDENT_AMBULATORY_CARE_PROVIDER_SITE_OTHER): Payer: No Typology Code available for payment source | Admitting: Internal Medicine

## 2020-07-20 DIAGNOSIS — G5603 Carpal tunnel syndrome, bilateral upper limbs: Secondary | ICD-10-CM

## 2020-07-20 DIAGNOSIS — M19041 Primary osteoarthritis, right hand: Secondary | ICD-10-CM | POA: Diagnosis not present

## 2020-07-20 DIAGNOSIS — M19042 Primary osteoarthritis, left hand: Secondary | ICD-10-CM

## 2020-07-20 DIAGNOSIS — M1990 Unspecified osteoarthritis, unspecified site: Secondary | ICD-10-CM

## 2020-07-20 DIAGNOSIS — I10 Essential (primary) hypertension: Secondary | ICD-10-CM | POA: Diagnosis not present

## 2020-07-20 DIAGNOSIS — M19019 Primary osteoarthritis, unspecified shoulder: Secondary | ICD-10-CM

## 2020-07-20 MED ORDER — CHLORTHALIDONE 25 MG PO TABS: 25.0000 mg | ORAL_TABLET | Freq: Every day | ORAL | 3 refills | Status: AC

## 2020-07-20 MED ORDER — DICLOFENAC SODIUM 1 % EX GEL: 4.0000 g | Freq: Four times a day (QID) | CUTANEOUS | 3 refills | Status: DC

## 2020-07-20 NOTE — Progress Notes (Signed)
   CC: Hand Pain  HPI:  [name] is a 59 y.o. male, with a PMHx below, presenting for arthritis. To see the management of his acute and chronic conditions, please see the A&P note under the Encounter tab.   Past Medical History:  Diagnosis Date  . Arthritis   . GERD (gastroesophageal reflux disease)   . Pancreatitis   . Tuberculosis    yrs. ago pt. states he was dx. here at [facility]   Review of Systems:   Review of Systems  Constitutional: Negative for chills, fever, malaise/fatigue and weight loss.  Cardiovascular: Negative for palpitations, orthopnea, claudication and leg swelling.  Gastrointestinal: Negative for abdominal pain, constipation, diarrhea, nausea and vomiting.  Musculoskeletal: Positive for joint pain.    Physical Exam:  Vitals:   07/20/20 0954 07/20/20 1013  BP: (!) 152/81 (!) 144/83  Pulse: 89 80  Temp: 98.6 F (37 C)   TempSrc: Oral   SpO2: 100%   Weight: 114 lb 9.6 oz (52 kg)    Physical Exam Constitutional:      Appearance: Normal appearance.  HENT:     Head: Normocephalic and atraumatic.  Eyes:     General:        Right eye: No discharge.        Left eye: No discharge.     Conjunctiva/sclera: Conjunctivae normal.  Cardiovascular:     Rate and Rhythm: Normal rate and regular rhythm.     Pulses: Normal pulses.     Heart sounds: Normal heart sounds. No murmur heard.  No friction rub. No gallop.   Pulmonary:     Effort: Pulmonary effort is normal.     Breath sounds: Normal breath sounds.  Abdominal:     General: Abdomen is flat. Bowel sounds are normal.     Palpations: Abdomen is soft.  Musculoskeletal:        General: Swelling present.     Comments: Swelling of the MCPs bilaterally in the upper upper extremities, no erythema, rash, or ulcerations present. Phalen and Tinel positive bilaterally.   Skin:    General: Skin is warm.     Findings: No bruising, erythema, lesion or rash.  Neurological:     General: No focal deficit present.      Mental Status: He is alert and oriented to person, place, and time.     Assessment & Plan:   See Encounters Tab for problem based charting.  Patient discussed with Dr. Rebeca Alert

## 2020-07-20 NOTE — Assessment & Plan Note (Signed)
Patient's BP today is  BP Readings from Last 3 Encounters:  07/20/20 (!) 144/83  07/10/20 128/78  03/02/20 119/78  He does have a history of elevated pressures. Will start Hygroton today.  - Hygroton 25 mg Daily - Assess BMP at next visit

## 2020-07-20 NOTE — Patient Instructions (Signed)
To Mr. Veals,  It was a pleasure meeting you today. Today we discussed your hand pain and blood pressure. For your hand pain you do have arthritis, we will prescribe Voltaren Gel which you should apply to your joints up to 4 times a day. On physical examination, you were found to have signs concerning for Carpal Tunnel Syndrome. When you go to your pharmacy, please pick up a pair of wrist splints, and where them as much as you can, including at night time. The wrist splints should help with your arthritis as well as your carpal tunnel. We will see you back in 6-8 weeks. Have a good day!  Sincerely,  Maudie Mercury, MD

## 2020-07-20 NOTE — Assessment & Plan Note (Signed)
Patient with known arthritis presents with pain bilaterally in both hands. He states that his joints are stiff in the mornings and swollen. He has had this issue for years and states that he has been taking Advil on and off to help his symptoms. On physical examination, he does have swelling/deformities of the MCP joints, no swan neck deviations noted. Considering that he also has coexistent carpal tunnel syndrome, will reorder voltaren gel for joint pain, and hand brace to assist in both controlling his arthritis and CPS.  - Reorder Voltaren Gel - Hand brace

## 2020-07-20 NOTE — Assessment & Plan Note (Signed)
Patient with continued bilateral hand tingling, "fire," and numbness along the median nerve distribution. He has not purchased braces that was previously discussed with Dr. Sheppard Coil. Phalen and Tinel are positive bilaterally. Continued emphasis on investing in wrist splints and wearing them nightly. Patient voices understanding at this time and will pick up splints at [facility] when he picks up his voltaren gel.  - Patient instructed to pick up wrist splints, also emphasized in his AVS paperwork.  - Follow up in 6-8 weeks for reassessment/compliance.  - If has been compliant but pain unresolved, consider ortho evaluation for further management.

## 2020-07-21 NOTE — Progress Notes (Signed)
Internal Medicine Clinic Attending  Case discussed with Dr. Winters at the time of the visit.  We reviewed the resident's history and exam and pertinent patient test results.  I agree with the assessment, diagnosis, and plan of care documented in the resident's note.  Maygen Sirico, M.D., Ph.D.  

## 2020-08-07 ENCOUNTER — Encounter: Payer: No Typology Code available for payment source | Admitting: Student

## 2020-08-14 NOTE — Progress Notes (Signed)
Opened in Error, Patient cancelled while in clinic.

## 2021-01-02 ENCOUNTER — Encounter (HOSPITAL_COMMUNITY): Payer: Self-pay | Admitting: Emergency Medicine

## 2021-01-02 ENCOUNTER — Emergency Department (HOSPITAL_COMMUNITY)
Admission: EM | Admit: 2021-01-02 | Discharge: 2021-01-02 | Disposition: A | Discharge status: Home / Self Care | Payer: No Typology Code available for payment source | Attending: Emergency Medicine | Admitting: Emergency Medicine

## 2021-01-02 ENCOUNTER — Other Ambulatory Visit: Payer: Self-pay

## 2021-01-02 DIAGNOSIS — I10 Essential (primary) hypertension: Secondary | ICD-10-CM | POA: Diagnosis not present

## 2021-01-02 DIAGNOSIS — K852 Alcohol induced acute pancreatitis without necrosis or infection: Secondary | ICD-10-CM | POA: Diagnosis not present

## 2021-01-02 DIAGNOSIS — Z7982 Long term (current) use of aspirin: Secondary | ICD-10-CM | POA: Insufficient documentation

## 2021-01-02 DIAGNOSIS — K219 Gastro-esophageal reflux disease without esophagitis: Secondary | ICD-10-CM | POA: Diagnosis not present

## 2021-01-02 DIAGNOSIS — Z96611 Presence of right artificial shoulder joint: Secondary | ICD-10-CM | POA: Diagnosis not present

## 2021-01-02 DIAGNOSIS — F17210 Nicotine dependence, cigarettes, uncomplicated: Secondary | ICD-10-CM | POA: Diagnosis not present

## 2021-01-02 DIAGNOSIS — G8929 Other chronic pain: Secondary | ICD-10-CM

## 2021-01-02 DIAGNOSIS — R109 Unspecified abdominal pain: Secondary | ICD-10-CM | POA: Diagnosis present

## 2021-01-02 DIAGNOSIS — Z96612 Presence of left artificial shoulder joint: Secondary | ICD-10-CM | POA: Insufficient documentation

## 2021-01-02 DIAGNOSIS — M25512 Pain in left shoulder: Secondary | ICD-10-CM

## 2021-01-02 LAB — CBC
HCT: 49.7 % (ref 39.0–52.0)
Hemoglobin: 16.2 g/dL (ref 13.0–17.0)
MCH: 28.7 pg (ref 26.0–34.0)
MCHC: 32.6 g/dL (ref 30.0–36.0)
MCV: 88.1 fL (ref 80.0–100.0)
Platelets: 378 10*3/uL (ref 150–400)
RBC: 5.64 MIL/uL (ref 4.22–5.81)
RDW: 13.8 % (ref 11.5–15.5)
WBC: 10.6 10*3/uL — ABNORMAL HIGH (ref 4.0–10.5)
nRBC: 0 % (ref 0.0–0.2)

## 2021-01-02 LAB — COMPREHENSIVE METABOLIC PANEL
ALT: 57 U/L — ABNORMAL HIGH (ref 0–44)
AST: 38 U/L (ref 15–41)
Albumin: 4.4 g/dL (ref 3.5–5.0)
Alkaline Phosphatase: 77 U/L (ref 38–126)
Anion gap: 16 — ABNORMAL HIGH (ref 5–15)
BUN: 10 mg/dL (ref 8–23)
CO2: 25 mmol/L (ref 22–32)
Calcium: 10.1 mg/dL (ref 8.9–10.3)
Chloride: 92 mmol/L — ABNORMAL LOW (ref 98–111)
Creatinine, Ser: 1.18 mg/dL (ref 0.61–1.24)
GFR, Estimated: >60 mL/min (ref >60)
Glucose, Bld: 146 mg/dL — ABNORMAL HIGH (ref 70–99)
Potassium: 3.1 mmol/L — ABNORMAL LOW (ref 3.5–5.1)
Sodium: 133 mmol/L — ABNORMAL LOW (ref 135–145)
Total Bilirubin: 1 mg/dL (ref 0.3–1.2)
Total Protein: 8.6 g/dL — ABNORMAL HIGH (ref 6.5–8.1)

## 2021-01-02 LAB — URINALYSIS, ROUTINE W REFLEX MICROSCOPIC
Bacteria, UA: NONE SEEN
Glucose, UA: NEGATIVE mg/dL
Ketones, ur: 20 mg/dL — AB
Nitrite: NEGATIVE
Protein, ur: 30 mg/dL — AB
Specific Gravity, Urine: 1.026 (ref 1.005–1.030)
pH: 5 (ref 5.0–8.0)

## 2021-01-02 LAB — LIPASE, BLOOD: Lipase: 117 U/L — ABNORMAL HIGH (ref 11–51)

## 2021-01-02 MED ORDER — ONDANSETRON 4 MG PO TBDP: 4.0000 mg | ORAL_TABLET | Freq: Three times a day (TID) | ORAL | 0 refills | Status: DC | PRN

## 2021-01-02 MED ORDER — PANTOPRAZOLE SODIUM 40 MG PO TBEC: 40.0000 mg | DELAYED_RELEASE_TABLET | Freq: Every day | ORAL | 0 refills | Status: DC

## 2021-01-02 MED ORDER — MORPHINE SULFATE (PF) 4 MG/ML IV SOLN
4.0000 mg | Freq: Once | INTRAVENOUS | Status: AC
  Administered 2021-01-02: 4 mg via INTRAVENOUS
  Filled 2021-01-02: qty 1

## 2021-01-02 MED ORDER — ONDANSETRON HCL 4 MG/2ML IJ SOLN
4.0000 mg | Freq: Once | INTRAMUSCULAR | Status: AC
  Administered 2021-01-02: 4 mg via INTRAVENOUS
  Filled 2021-01-02: qty 2

## 2021-01-02 MED ORDER — ONDANSETRON HCL 4 MG/2ML IJ SOLN
4.0000 mg | Freq: Once | INTRAMUSCULAR | Status: DC
  Filled 2021-01-02: qty 2

## 2021-01-02 MED ORDER — OXYCODONE-ACETAMINOPHEN 5-325 MG PO TABS: 1.0000 | ORAL_TABLET | Freq: Three times a day (TID) | ORAL | 0 refills | Status: DC | PRN

## 2021-01-02 MED ORDER — FENTANYL CITRATE (PF) 100 MCG/2ML IJ SOLN
50.0000 ug | Freq: Once | INTRAMUSCULAR | Status: AC
  Administered 2021-01-02: 50 ug via INTRAVENOUS
  Filled 2021-01-02: qty 2

## 2021-01-02 MED ORDER — SODIUM CHLORIDE 0.9 % IV BOLUS
1000.0000 mL | Freq: Once | INTRAVENOUS | Status: AC
  Administered 2021-01-02: 1000 mL via INTRAVENOUS

## 2021-01-02 NOTE — ED Triage Notes (Signed)
Patient with history of pancreatitis, having abdominal pain, nausea and vomiting since yesterday.  Patient states his last drink was 3 days ago.  Patient with generalized pain all over body, especially his shoulders.

## 2021-01-02 NOTE — ED Notes (Signed)
Patient verbalizes understanding of discharge instructions. Opportunity for questioning and answers were provided. Arm band removed by staff, patient discharged from ED. 

## 2021-01-02 NOTE — ED Provider Notes (Signed)
[place] EMERGENCY DEPARTMENT Provider Note   CSN: [id number] Arrival date & time: 01/02/21  0445     History Chief Complaint  Patient presents with  . Abdominal Pain    Stephen [name] is a 60 y.o. male possible history of GERD, pancreatitis who presents for evaluation of abdominal pain, nausea/vomiting x2 days.  He reports about 3 days ago, he had 2 beers.  He reports that the next day, he started having symptoms.  Reports pain to his upper abdomen, worse in the left upper quadrant as well as nausea/vomiting.  He reports he has not been able to tolerate p.o.  He states that he has not had any diarrhea.  He has had some subjective hot sweats, chills at home but has not measured a temperature.  He reports emesis is nonbloody, nonbilious.  He does report that his chest or to burning a little bit after he started vomiting.  He has a history of alcoholic pancreatitis and states this feels similar.  He has not had any difficulty breathing, urinary complaints, diarrhea.  The history is provided by the patient.       Past Medical History:  Diagnosis Date  . Arthritis   . GERD (gastroesophageal reflux disease)   . Pancreatitis   . Tuberculosis    yrs. ago pt. states he was dx. here at [facility]    Patient Active Problem List   Diagnosis Date Noted  . Arthritis   . Blurred vision, bilateral 03/02/2020  . Right ear impacted cerumen 03/02/2020  . Carpal tunnel syndrome, bilateral 01/20/2020  . Need for diphtheria-tetanus-pertussis (Tdap) vaccine 01/20/2020  . Hypertension 01/20/2020  . Lipoma 01/20/2020  . Pancreatitis, acute 11/30/2019  . Rotator cuff arthropathy 09/03/2019  . Hypokalemia 05/12/2019  . Rotator cuff arthropathy of left shoulder   . Shoulder arthritis 12/11/2018  . Hepatitis C antibody test positive 01/19/2018  . Chronic pain of both shoulders 01/17/2018  . Screening for colon cancer 01/17/2018  . Sinus congestion 01/17/2018  . Malnutrition of  moderate degree 01/09/2018  . Chronic pancreatitis ([place]) 01/06/2018  . Hyponatremia 01/06/2018    Past Surgical History:  Procedure Laterality Date  . FRACTURE SURGERY     jaw  . REVERSE SHOULDER ARTHROPLASTY Left 12/11/2018   Procedure: LEFT REVERSE SHOULDER ARTHROPLASTY;  Surgeon: Meredith Pel, MD;  Location: [place];  Service: Orthopedics;  Laterality: Left;  . REVERSE SHOULDER ARTHROPLASTY Right 09/03/2019  . REVERSE SHOULDER ARTHROPLASTY Right 09/03/2019   Procedure: RIGHT REVERSE SHOULDER ARTHROPLASTY;  Surgeon: Meredith Pel, MD;  Location: [place];  Service: Orthopedics;  Laterality: Right;       History reviewed. No pertinent family history.  Social History   Tobacco Use  . Smoking status: Current Every Day Smoker    Years: 40.00    Types: Cigarettes  . Smokeless tobacco: Never Used  . Tobacco comment: 2-3  cigarettes per day  Vaping Use  . Vaping Use: Never used  Substance Use Topics  . Alcohol use: Yes    Comment: 1-2 beers daily.   . Drug use: Yes    Types: Marijuana    Comment: 1 joint every other day    Home Medications Prior to Admission medications   Medication Sig Start Date End Date Taking? Authorizing Provider  ondansetron (ZOFRAN ODT) 4 MG disintegrating tablet Take 1 tablet (4 mg total) by mouth every 8 (eight) hours as needed for nausea or vomiting. 01/02/21  Yes Volanda Napoleon, PA-C  oxyCODONE-acetaminophen (PERCOCET/ROXICET) 5-325 MG tablet Take 1-2 tablets by mouth every 8 (eight) hours as needed for severe pain. 01/02/21  Yes Providence Lanius A, PA-C  ASPIRIN LOW DOSE 81 MG EC tablet TAKE 1 TABLET BY MOUTH EVERY DAY 03/03/20   Magnant, Charles L, PA-C  chlorthalidone (HYGROTON) 25 MG tablet Take 1 tablet (25 mg total) by mouth daily. 07/20/20   Maudie Mercury, MD  diclofenac Sodium (VOLTAREN) 1 % GEL Apply 4 g topically 4 (four) times daily. 07/20/20   Maudie Mercury, MD  methocarbamol (ROBAXIN) 500 MG tablet TAKE 1 TABLET BY MOUTH EVERY 8  HOURS AS NEEDED FOR MUSCLE SPASMS. 03/02/20   Magnant, Charles L, PA-C  pantoprazole (PROTONIX) 40 MG tablet Take 1 tablet (40 mg total) by mouth daily. 01/02/21   Volanda Napoleon, PA-C  promethazine (PHENERGAN) 25 MG suppository Place 1 suppository (25 mg total) rectally every 6 (six) hours as needed for nausea or vomiting. 06/12/87   Delora Fuel, MD  thiamine 100 MG tablet Take 1 tablet (100 mg total) by mouth daily. 12/11/19   Welford Roche, MD    Allergies    Patient has no known allergies.  Review of Systems   Review of Systems  Constitutional: Negative for fever.  Respiratory: Negative for cough and shortness of breath.   Cardiovascular: Negative for chest pain.  Gastrointestinal: Positive for abdominal pain, nausea and vomiting.  Genitourinary: Negative for dysuria and hematuria.  Neurological: Negative for headaches.  All other systems reviewed and are negative.   Physical Exam Updated Vital Signs BP (!) 142/94   Pulse 90   Temp 98.6 F (37 C) (Oral)   Resp 11   SpO2 97%   Physical Exam Vitals and nursing note reviewed.  Constitutional:      Appearance: Normal appearance. He is well-developed and well-nourished.  HENT:     Head: Normocephalic and atraumatic.     Mouth/Throat:     Mouth: Oropharynx is clear and moist and mucous membranes are normal.  Eyes:     General: Lids are normal.     Extraocular Movements: EOM normal.     Conjunctiva/sclera: Conjunctivae normal.     Pupils: Pupils are equal, round, and reactive to light.  Cardiovascular:     Rate and Rhythm: Normal rate and regular rhythm.     Pulses: Normal pulses.     Heart sounds: Normal heart sounds. No murmur heard. No friction rub. No gallop.   Pulmonary:     Effort: Pulmonary effort is normal.     Breath sounds: Normal breath sounds.     Comments: Lungs clear to auscultation bilaterally.  Symmetric chest rise.  No wheezing, rales, rhonchi. Abdominal:     Palpations: Abdomen is soft.  Abdomen is not rigid.     Tenderness: There is abdominal tenderness in the epigastric area and left upper quadrant. There is no guarding.     Comments: Tenderness palpation noted to the upper abdomen, epigastric and left upper quadrant was particular.  No rigidity, guarding.  Musculoskeletal:        General: Normal range of motion.     Cervical back: Full passive range of motion without pain.  Skin:    General: Skin is warm and dry.     Capillary Refill: Capillary refill takes less than 2 seconds.  Neurological:     Mental Status: He is alert and oriented to person, place, and time.  Psychiatric:        Mood and Affect: Mood and  affect normal.        Speech: Speech normal.     ED Results / Procedures / Treatments   Labs (all labs ordered are listed, but only abnormal results are displayed) Labs Reviewed  LIPASE, BLOOD - Abnormal; Notable for the following components:      Result Value   Lipase 117 (*)    All other components within normal limits  COMPREHENSIVE METABOLIC PANEL - Abnormal; Notable for the following components:   Sodium 133 (*)    Potassium 3.1 (*)    Chloride 92 (*)    Glucose, Bld 146 (*)    Total Protein 8.6 (*)    ALT 57 (*)    Anion gap 16 (*)    All other components within normal limits  CBC - Abnormal; Notable for the following components:   WBC 10.6 (*)    All other components within normal limits  URINALYSIS, ROUTINE W REFLEX MICROSCOPIC - Abnormal; Notable for the following components:   Color, Urine AMBER (*)    APPearance HAZY (*)    Hgb urine dipstick MODERATE (*)    Bilirubin Urine SMALL (*)    Ketones, ur 20 (*)    Protein, ur 30 (*)    Leukocytes,Ua TRACE (*)    All other components within normal limits    EKG None  Radiology No results found.  Procedures Procedures   Medications Ordered in ED Medications  ondansetron (ZOFRAN) injection 4 mg (0 mg Intravenous Hold 01/02/21 0909)  sodium chloride 0.9 % bolus 1,000 mL (0 mLs  Intravenous Stopped 01/02/21 0907)  ondansetron (ZOFRAN) injection 4 mg (4 mg Intravenous Given 01/02/21 0733)  morphine 4 MG/ML injection 4 mg (4 mg Intravenous Given 01/02/21 0732)  fentaNYL (SUBLIMAZE) injection 50 mcg (50 mcg Intravenous Given 01/02/21 5732)    ED Course  I have reviewed the triage vital signs and the nursing notes.  Pertinent labs & imaging results that were available during my care of the patient were reviewed by me and considered in my medical decision making (see chart for details).    MDM Rules/Calculators/A&P                          60 year old male past with history of alcoholic pancreatitis who presents for evaluation of abdominal pain, nausea/vomiting that is been ongoing for last 2 days.  He reports he last drank alcohol 3 days ago.  On initial arrival, he is afebrile, appears uncomfortable but no acute distress.  Vital signs are stable.  On exam, he has tenderness noted to the upper abdomen.  Concern for pancreatitis.  Also consider viral GI process.  Labs ordered in triage.  CMP shows potassium 3.1.  BUN and creatinine within normal limits.  Anion gap is 16.  Lipase is elevated at 117.  Review of records show that he typically has elevations in his lipase.  From a year ago, his lipase was in the 400s, 300s.  UA shows small hemoglobin, trace leukocytes.  No bacteria.  CBC shows slight leukocytosis of 10.6.  Re-evaluation. Patient reports feeling better. He is still having some pain. Will give additional analgesics.   Reevaluation after additional pain medication.  Patient reports feeling better.  Will p.o. challenge patient.  Patient able tolerate p.o.  He reports feeling better.  Repeat abdominal exam is improved.  Patient states he feels comfortable going home.  At this time, he has no fever.  He has a very minimal  white blood cell count.  His pain is improved.  Do not feel that CT abdomen pelvis is warranted as do not suspect surgical abdomen.  At this time,  patient stable for discharge home.  Will give short course of pain medication, nausea/vomiting medication.  Patient instructed on clear liquid diet. At this time, patient exhibits no emergent life-threatening condition that require further evaluation in ED. Patient had ample opportunity for questions and discussion. All patient's questions were answered with full understanding. Strict return precautions discussed. Patient expresses understanding and agreement to plan.   Portions of this note were generated with Lobbyist. Dictation errors may occur despite best attempts at proofreading.  Final Clinical Impression(s) / ED Diagnoses Final diagnoses:  Alcohol-induced acute pancreatitis, unspecified complication status    Rx / DC Orders ED Discharge Orders         Ordered    ondansetron (ZOFRAN ODT) 4 MG disintegrating tablet  Every 8 hours PRN        01/02/21 1044    oxyCODONE-acetaminophen (PERCOCET/ROXICET) 5-325 MG tablet  Every 8 hours PRN        01/02/21 1044    pantoprazole (PROTONIX) 40 MG tablet  Daily        01/02/21 1044           Desma Mcgregor 01/02/21 1101    Truddie Hidden, MD 01/02/21 1215

## 2021-01-02 NOTE — ED Notes (Signed)
Pt verbally abusive to staff and pts in the lobby. Pt complaining of wait times. Pt ambulated out of wheelchair to another seat and then returned to wheelchair.

## 2021-01-02 NOTE — Discharge Instructions (Signed)
Take pain medications as directed for break through pain. Do not drive or operate machinery while taking this medication.   Take Zofran for nausea/vomiting.  As we discussed, stick to a clear liquid diet for the next several days.  Return to the emergency department for abdominal pain, nausea/vomiting, fevers or any other worsening concerning symptoms.

## 2021-03-25 ENCOUNTER — Emergency Department (HOSPITAL_COMMUNITY)
Admission: EM | Admit: 2021-03-25 | Discharge: 2021-03-25 | Disposition: A | Discharge status: Home / Self Care | Payer: No Typology Code available for payment source | Attending: Emergency Medicine | Admitting: Emergency Medicine

## 2021-03-25 ENCOUNTER — Other Ambulatory Visit: Payer: Self-pay

## 2021-03-25 ENCOUNTER — Encounter (HOSPITAL_COMMUNITY): Payer: Self-pay

## 2021-03-25 DIAGNOSIS — R1011 Right upper quadrant pain: Secondary | ICD-10-CM | POA: Insufficient documentation

## 2021-03-25 DIAGNOSIS — Z5321 Procedure and treatment not carried out due to patient leaving prior to being seen by health care provider: Secondary | ICD-10-CM | POA: Diagnosis not present

## 2021-03-25 DIAGNOSIS — R112 Nausea with vomiting, unspecified: Secondary | ICD-10-CM | POA: Insufficient documentation

## 2021-03-25 NOTE — ED Triage Notes (Signed)
Pt c/o RUQ pain with nausea and vomiting for the apst 3 days.   Hx of alcohol induced pancreatitis.

## 2021-03-25 NOTE — ED Notes (Signed)
Pt started shouting "I want to blow my nose" as Medic Lilli was getting a tissues, pt started shouting and flailing erratically in triage and shouting "fuck this shit! Let me go to the [place]!"

## 2021-03-25 NOTE — ED Notes (Signed)
While attempting to get vital signs, pt asked for some tissues. This writer started the blood pressure and went to retrieve a box of tissues for the pt. The pt was handed the box of tissues but asked to keep his right arm still. Pt became very upset and began cursing. Pt start throwing his mucus filled tissues and stated he is just going to go to the [place]. This Probation officer tried to explain that I was "just trying to get his vital signs," but pt was not willing to listen to this Probation officer. Pt continued to curse throughout the lobby.

## 2021-03-25 NOTE — ED Notes (Signed)
Pt heard on the phone "I wanted to blow my nose, and they wanted to get vital signs, Stephen [name] this shit!" pt walked out of triage, with steady gait.

## 2022-11-01 ENCOUNTER — Emergency Department (HOSPITAL_COMMUNITY): Payer: Medicare HMO

## 2022-11-01 ENCOUNTER — Other Ambulatory Visit: Payer: Self-pay

## 2022-11-01 ENCOUNTER — Emergency Department (HOSPITAL_COMMUNITY)
Admission: EM | Admit: 2022-11-01 | Discharge: 2022-11-02 | Disposition: A | Discharge status: Home / Self Care | Payer: Medicare HMO | Attending: Emergency Medicine | Admitting: Emergency Medicine

## 2022-11-01 DIAGNOSIS — Z7982 Long term (current) use of aspirin: Secondary | ICD-10-CM | POA: Diagnosis not present

## 2022-11-01 DIAGNOSIS — Z79899 Other long term (current) drug therapy: Secondary | ICD-10-CM | POA: Diagnosis not present

## 2022-11-01 DIAGNOSIS — E871 Hypo-osmolality and hyponatremia: Secondary | ICD-10-CM | POA: Insufficient documentation

## 2022-11-01 DIAGNOSIS — R1013 Epigastric pain: Secondary | ICD-10-CM | POA: Diagnosis present

## 2022-11-01 DIAGNOSIS — E876 Hypokalemia: Secondary | ICD-10-CM | POA: Diagnosis not present

## 2022-11-01 DIAGNOSIS — R000 Tachycardia, unspecified: Secondary | ICD-10-CM | POA: Diagnosis not present

## 2022-11-01 DIAGNOSIS — K852 Alcohol induced acute pancreatitis without necrosis or infection: Secondary | ICD-10-CM | POA: Insufficient documentation

## 2022-11-01 DIAGNOSIS — D72829 Elevated white blood cell count, unspecified: Secondary | ICD-10-CM | POA: Diagnosis not present

## 2022-11-01 DIAGNOSIS — R7309 Other abnormal glucose: Secondary | ICD-10-CM | POA: Insufficient documentation

## 2022-11-01 LAB — CBC WITH DIFFERENTIAL/PLATELET
Abs Immature Granulocytes: 0.04 10*3/uL (ref 0.00–0.07)
Basophils Absolute: 0 10*3/uL (ref 0.0–0.1)
Basophils Relative: 0 %
Eosinophils Absolute: 0 10*3/uL (ref 0.0–0.5)
Eosinophils Relative: 0 %
HCT: 46.1 % (ref 39.0–52.0)
Hemoglobin: 15.3 g/dL (ref 13.0–17.0)
Immature Granulocytes: 0 %
Lymphocytes Relative: 14 %
Lymphs Abs: 1.6 10*3/uL (ref 0.7–4.0)
MCH: 28.5 pg (ref 26.0–34.0)
MCHC: 33.2 g/dL (ref 30.0–36.0)
MCV: 85.8 fL (ref 80.0–100.0)
Monocytes Absolute: 0.8 10*3/uL (ref 0.1–1.0)
Monocytes Relative: 7 %
Neutro Abs: 8.8 10*3/uL — ABNORMAL HIGH (ref 1.7–7.7)
Neutrophils Relative %: 79 %
Platelets: 414 10*3/uL — ABNORMAL HIGH (ref 150–400)
RBC: 5.37 MIL/uL (ref 4.22–5.81)
RDW: 13.2 % (ref 11.5–15.5)
WBC: 11.4 10*3/uL — ABNORMAL HIGH (ref 4.0–10.5)
nRBC: 0 % (ref 0.0–0.2)

## 2022-11-01 LAB — ETHANOL: Alcohol, Ethyl (B): <10 mg/dL (ref <10)

## 2022-11-01 LAB — COMPREHENSIVE METABOLIC PANEL
ALT: 54 U/L — ABNORMAL HIGH (ref 0–44)
AST: 42 U/L — ABNORMAL HIGH (ref 15–41)
Albumin: 4.2 g/dL (ref 3.5–5.0)
Alkaline Phosphatase: 83 U/L (ref 38–126)
Anion gap: 14 (ref 5–15)
BUN: 10 mg/dL (ref 8–23)
CO2: 25 mmol/L (ref 22–32)
Calcium: 9.8 mg/dL (ref 8.9–10.3)
Chloride: 95 mmol/L — ABNORMAL LOW (ref 98–111)
Creatinine, Ser: 0.94 mg/dL (ref 0.61–1.24)
GFR, Estimated: >60 mL/min (ref >60)
Glucose, Bld: 118 mg/dL — ABNORMAL HIGH (ref 70–99)
Potassium: 2.9 mmol/L — ABNORMAL LOW (ref 3.5–5.1)
Sodium: 134 mmol/L — ABNORMAL LOW (ref 135–145)
Total Bilirubin: 0.7 mg/dL (ref 0.3–1.2)
Total Protein: 8.4 g/dL — ABNORMAL HIGH (ref 6.5–8.1)

## 2022-11-01 LAB — LIPASE, BLOOD: Lipase: 210 U/L — ABNORMAL HIGH (ref 11–51)

## 2022-11-01 MED ORDER — HYDROCODONE-ACETAMINOPHEN 5-325 MG PO TABS
2.0000 | ORAL_TABLET | Freq: Once | ORAL | Status: AC
  Administered 2022-11-01: 2 via ORAL
  Filled 2022-11-01: qty 2

## 2022-11-01 MED ORDER — ONDANSETRON 4 MG PO TBDP
4.0000 mg | ORAL_TABLET | Freq: Once | ORAL | Status: AC
  Administered 2022-11-01: 4 mg via ORAL
  Filled 2022-11-01: qty 1

## 2022-11-01 MED ORDER — IOHEXOL 300 MG/ML  SOLN
100.0000 mL | Freq: Once | INTRAMUSCULAR | Status: AC | PRN
  Administered 2022-11-01: 85 mL via INTRAVENOUS

## 2022-11-01 NOTE — ED Notes (Signed)
Needs IV for CT  

## 2022-11-01 NOTE — ED Provider Triage Note (Signed)
Emergency Medicine Provider Triage Evaluation Note  Stephen [name] , a 61 y.o. male  was evaluated in triage.  Pt complains of severe abdominal pain, history of pancreatitis.  Multiple episodes of vomiting today.  Feels the same..  Review of Systems  Positive:  Negative:   Physical Exam  BP (!) 151/87 (BP Location: Left Arm)   Pulse (!) 101   Temp 98.7 F (37.1 C) (Oral)   Resp 17   Ht '5\' 7"'$  (1.702 m)   Wt 54.4 kg   SpO2 100%   BMI 18.79 kg/m  Gen:   Awake, no distress   Resp:  Normal effort  MSK:   Moves extremities without difficulty  Other:  Patient is crying and writhing around in the bed  Medical Decision Making  Medically screening exam initiated at 5:51 PM.  Appropriate orders placed.  MIQUAN TANDON was informed that the remainder of the evaluation will be completed by another provider, this initial triage assessment does not replace that evaluation, and the importance of remaining in the ED until their evaluation is complete.     Margarita Mail, PA-C 11/01/22 1752

## 2022-11-01 NOTE — ED Notes (Signed)
Pt's brother would like to be notified with updates on pt's care.

## 2022-11-01 NOTE — ED Triage Notes (Signed)
Pt states he has Pancreatitis, today vomited 2-3 times. Pain in epigastric area.

## 2022-11-02 LAB — URINALYSIS, ROUTINE W REFLEX MICROSCOPIC
Bilirubin Urine: NEGATIVE
Glucose, UA: NEGATIVE mg/dL
Ketones, ur: 5 mg/dL — AB
Leukocytes,Ua: NEGATIVE
Nitrite: NEGATIVE
Protein, ur: NEGATIVE mg/dL
Specific Gravity, Urine: 1.015 (ref 1.005–1.030)
pH: 6 (ref 5.0–8.0)

## 2022-11-02 LAB — MAGNESIUM: Magnesium: 1.7 mg/dL (ref 1.7–2.4)

## 2022-11-02 MED ORDER — ONDANSETRON HCL 4 MG PO TABS: 4.0000 mg | ORAL_TABLET | Freq: Four times a day (QID) | ORAL | 0 refills | Status: AC

## 2022-11-02 MED ORDER — ONDANSETRON HCL 4 MG/2ML IJ SOLN
4.0000 mg | Freq: Once | INTRAMUSCULAR | Status: AC
  Administered 2022-11-02: 4 mg via INTRAVENOUS
  Filled 2022-11-02: qty 2

## 2022-11-02 MED ORDER — HYDROMORPHONE HCL 1 MG/ML IJ SOLN
1.0000 mg | Freq: Once | INTRAMUSCULAR | Status: AC
  Administered 2022-11-02: 1 mg via INTRAVENOUS
  Filled 2022-11-02: qty 1

## 2022-11-02 MED ORDER — OXYCODONE-ACETAMINOPHEN 5-325 MG PO TABS: 1.0000 | ORAL_TABLET | Freq: Three times a day (TID) | ORAL | 0 refills | Status: AC | PRN

## 2022-11-02 MED ORDER — SODIUM CHLORIDE 0.9 % IV BOLUS
1000.0000 mL | Freq: Once | INTRAVENOUS | Status: AC
  Administered 2022-11-02: 1000 mL via INTRAVENOUS

## 2022-11-02 MED ORDER — DICYCLOMINE HCL 20 MG PO TABS: 20.0000 mg | ORAL_TABLET | Freq: Two times a day (BID) | ORAL | 0 refills | Status: AC | PRN

## 2022-11-02 MED ORDER — POTASSIUM CHLORIDE 10 MEQ/100ML IV SOLN
10.0000 meq | INTRAVENOUS | Status: AC
  Administered 2022-11-02 (×3): 10 meq via INTRAVENOUS
  Filled 2022-11-02 (×3): qty 100

## 2022-11-02 MED ORDER — POTASSIUM CHLORIDE CRYS ER 20 MEQ PO TBCR
40.0000 meq | EXTENDED_RELEASE_TABLET | Freq: Once | ORAL | Status: AC
  Administered 2022-11-02: 40 meq via ORAL
  Filled 2022-11-02: qty 2

## 2022-11-02 NOTE — Discharge Instructions (Signed)
You have pancreatitis, I have given you Zofran for nausea, Bentyl for stomach spasms, this is not controlling your pain you may use some pain medication I provided you.  I recommend a bland diet for next couple days as your symptoms improve may you may advance your diet I recommend discontinue alcohol consumption this is likely caused your pancreatitis  I have given you a short course of narcotics please take as prescribed.  This medication can make you drowsy do not consume alcohol or operate heavy machinery when taking this medication.  This medication is Tylenol in it do not take Tylenol and take this medication.    Please follow-up with your PCP within the next week for reassessment of your potassium  Come back to the emergency department if you develop chest pain, shortness of breath, severe abdominal pain, uncontrolled nausea, vomiting, diarrhea.

## 2022-11-02 NOTE — ED Provider Notes (Signed)
[facility] DEPT Provider Note   CSN: [id number] Arrival date & time: 11/01/22  1636     History  Chief Complaint  Patient presents with   Abdominal Pain    Stephen [name] is a 61 y.o. male.  HPI    Medical history including GERD, alcohol dependency, alcoholic induced pancreatitis presents emerged for complaints of epigastric pain, started about 3 days ago, states the pain is constant, does not radiate, associated nausea and vomiting no bloody emesis or coffee-ground emesis no bloody stools or dark tarry stools, he states that he has been drinking alcohol states that he drank beer daily but he had a few drinks 5 days ago, he denies any fevers chills cough congestion general body aches, no chest pain or shortness of breath.  Patient states last time he drank was about 3 days ago, states he has never been admitted for alcohol withdrawals, denies feeling anxious or nervous at this time. Home Medications Prior to Admission medications   Medication Sig Start Date End Date Taking? Authorizing Provider  dicyclomine (BENTYL) 20 MG tablet Take 1 tablet (20 mg total) by mouth 2 (two) times daily as needed for spasms. 11/02/22  Yes Marcello Fennel, PA-C  ondansetron (ZOFRAN) 4 MG tablet Take 1 tablet (4 mg total) by mouth every 6 (six) hours. 11/02/22  Yes Marcello Fennel, PA-C  oxyCODONE-acetaminophen (PERCOCET/ROXICET) 5-325 MG tablet Take 1 tablet by mouth every 8 (eight) hours as needed for up to 2 days for severe pain. 11/02/22 11/04/22 Yes Marcello Fennel, PA-C  ASPIRIN LOW DOSE 81 MG EC tablet TAKE 1 TABLET BY MOUTH EVERY DAY 03/03/20   Magnant, Gerrianne Scale, PA-C  chlorthalidone (HYGROTON) 25 MG tablet Take 1 tablet (25 mg total) by mouth daily. 07/20/20   Maudie Mercury, MD  diclofenac Sodium (VOLTAREN) 1 % GEL Apply 4 g topically 4 (four) times daily. 07/20/20   Maudie Mercury, MD  methocarbamol (ROBAXIN) 500 MG tablet TAKE 1 TABLET BY MOUTH EVERY 8  HOURS AS NEEDED FOR MUSCLE SPASMS. 03/02/20   Magnant, Charles L, PA-C  ondansetron (ZOFRAN ODT) 4 MG disintegrating tablet Take 1 tablet (4 mg total) by mouth every 8 (eight) hours as needed for nausea or vomiting. 01/02/21   Volanda Napoleon, PA-C  pantoprazole (PROTONIX) 40 MG tablet Take 1 tablet (40 mg total) by mouth daily. 01/02/21   Volanda Napoleon, PA-C  promethazine (PHENERGAN) 25 MG suppository Place 1 suppository (25 mg total) rectally every 6 (six) hours as needed for nausea or vomiting. 07/09/10   Delora Fuel, MD  thiamine 100 MG tablet Take 1 tablet (100 mg total) by mouth daily. 12/11/19   Welford Roche, MD      Allergies    Patient has no known allergies.    Review of Systems   Review of Systems  Constitutional:  Negative for chills and fever.  Respiratory:  Negative for shortness of breath.   Cardiovascular:  Negative for chest pain.  Gastrointestinal:  Positive for abdominal pain, nausea and vomiting.  Neurological:  Negative for headaches.    Physical Exam Updated Vital Signs BP (!) 149/87   Pulse 84   Temp 97.8 F (36.6 C) (Oral)   Resp 17   Ht _0  (1.702 m)   Wt 54.4 kg   SpO2 98%   BMI 18.79 kg/m  Physical Exam Vitals and nursing note reviewed.  Constitutional:      General: He is not in acute distress.  Appearance: He is not ill-appearing.  HENT:     Head: Normocephalic and atraumatic.     Nose: No congestion.  Eyes:     Conjunctiva/sclera: Conjunctivae normal.  Cardiovascular:     Rate and Rhythm: Regular rhythm. Tachycardia present.     Pulses: Normal pulses.     Heart sounds: No murmur heard.    No friction rub. No gallop.  Pulmonary:     Effort: No respiratory distress.     Breath sounds: No wheezing, rhonchi or rales.  Abdominal:     Palpations: Abdomen is soft.     Tenderness: There is abdominal tenderness. There is no right CVA tenderness or left CVA tenderness.     Comments: Abdomen nondistended, soft, he has noted  epigastric tenderness without guarding movement tenderness or peritoneal sign negative [employer] point.  Skin:    General: Skin is warm and dry.  Neurological:     Mental Status: He is alert.  Psychiatric:        Mood and Affect: Mood normal.     ED Results / Procedures / Treatments   Labs (all labs ordered are listed, but only abnormal results are displayed) Labs Reviewed  CBC WITH DIFFERENTIAL/PLATELET - Abnormal; Notable for the following components:      Result Value   WBC 11.4 (*)    Platelets 414 (*)    Neutro Abs 8.8 (*)    All other components within normal limits  COMPREHENSIVE METABOLIC PANEL - Abnormal; Notable for the following components:   Sodium 134 (*)    Potassium 2.9 (*)    Chloride 95 (*)    Glucose, Bld 118 (*)    Total Protein 8.4 (*)    AST 42 (*)    ALT 54 (*)    All other components within normal limits  LIPASE, BLOOD - Abnormal; Notable for the following components:   Lipase 210 (*)    All other components within normal limits  ETHANOL  MAGNESIUM  URINALYSIS, ROUTINE W REFLEX MICROSCOPIC    EKG None  Radiology CT Abdomen Pelvis W Contrast  Result Date: 11/01/2022 CLINICAL DATA:  Patient states he has pancreatitis. Multiple vomiting episodes. Epigastric pain EXAM: CT ABDOMEN AND PELVIS WITH CONTRAST TECHNIQUE: Multidetector CT imaging of the abdomen and pelvis was performed using the standard protocol following bolus administration of intravenous contrast. RADIATION DOSE REDUCTION: This exam was performed according to the departmental dose-optimization program which includes automated exposure control, adjustment of the mA and/or kV according to patient size and/or use of iterative reconstruction technique. CONTRAST:  42m OMNIPAQUE IOHEXOL 300 MG/ML  SOLN COMPARISON:  10/16/2018 FINDINGS: Lower chest: No acute abnormality. Hepatobiliary: No focal liver abnormality is seen. No gallstones, gallbladder wall thickening, or biliary dilatation.  Pancreas: Acute peripancreatic fluid and stranding greatest about the pancreatic head. Unchanged mild prominence of the main pancreatic duct. No evidence of parenchymal necrosis. Patent portal vein. Spleen: Normal in size without focal abnormality. Adrenals/Urinary Tract: Adrenal glands are unremarkable. Kidneys are normal, without renal calculi, focal lesion, or hydronephrosis. Bladder is unremarkable. Stomach/Bowel: Stomach is within normal limits. Appendix appears normal. No evidence of bowel wall thickening, distention, or inflammatory changes. Vascular/Lymphatic: Aortic atherosclerosis. No enlarged abdominal or pelvic lymph nodes. Reproductive: Prostate is unremarkable. Other: No free intraperitoneal air. Musculoskeletal: Advanced degenerative disc disease at L2-L3. Bilateral femoral head AVN. No acute osseous abnormality. IMPRESSION: Acute pancreatitis. Electronically Signed   By: TPlacido SouM.D.   On: 11/01/2022 21:51    Procedures Procedures  Medications Ordered in ED Medications  potassium chloride 10 mEq in 100 mL IVPB (10 mEq Intravenous New Bag/Given 11/02/22 3664)  HYDROcodone-acetaminophen (NORCO/VICODIN) 5-325 MG per tablet 2 tablet (2 tablets Oral Given 11/01/22 1756)  ondansetron (ZOFRAN-ODT) disintegrating tablet 4 mg (4 mg Oral Given 11/01/22 1757)  iohexol (OMNIPAQUE) 300 MG/ML solution 100 mL (85 mLs Intravenous Contrast Given 11/01/22 2134)  sodium chloride 0.9 % bolus 1,000 mL (0 mLs Intravenous Stopped 11/02/22 0524)  potassium chloride SA (KLOR-CON M) CR tablet 40 mEq (40 mEq Oral Given 11/02/22 0456)  HYDROmorphone (DILAUDID) injection 1 mg (1 mg Intravenous Given 11/02/22 0421)  ondansetron (ZOFRAN) injection 4 mg (4 mg Intravenous Given 11/02/22 0420)    ED Course/ Medical Decision Making/ A&P                           Medical Decision Making Amount and/or Complexity of Data Reviewed Labs: ordered.  Risk Prescription drug management.   This patient  presents to the ED for concern of abdominal pain, this involves an extensive number of treatment options, and is a complaint that carries with it a high risk of complications and morbidity.  The differential diagnosis includes pancreatitis, cholecystitis, PE, bowel obstruction    Additional history obtained:  Additional history obtained from N/A External records from outside source obtained and reviewed including ER notes   Co morbidities that complicate the patient evaluation  Alcohol dependency  Social Determinants of Health:  N/A    Lab Tests:  I Ordered, and personally interpreted labs.  The pertinent results include: CBC shows leukocytosis of 11.4, CMP shows sodium 134 potassium 2.9 glucose 118, AST 42 ALT 54 lipase is 210, ethanol less than 10   Imaging Studies ordered:  I ordered imaging studies including CT AP I independently visualized and interpreted imaging which showed acute pancreatitis I agree with the radiologist interpretation   Cardiac Monitoring:  The patient was maintained on a cardiac monitor.  I personally viewed and interpreted the cardiac monitored which showed an underlying rhythm of: N/A   Medicines ordered and prescription drug management:  I ordered medication including fluids, antiemetics, pain medication I have reviewed the patients home medicines and have made adjustments as needed  Critical Interventions:  N/A   Reevaluation:  Presents with epigastric pain triage obtain lab work imaging which I personally reviewed exam is consistent with acute pancreatitis seen on CT imaging, will add on a magnesium, start him on IV potassium, pain medication fluids and reassess  Patient was reassessed after pain medication and fluids, heart rate has improved, abdomen is soft nontender, he is tolerating p.o., will observe until IV potassium is finished    Consultations Obtained:  N/A    Test Considered:  N/A    Rule out low suspicion  for lower lobe pneumonia as lung sounds are clear bilaterally, will defer imaging at this time.  I have low suspicion for liver or gallbladder abnormality as she has no right upper quadrant tenderness, liver enzymes, alk phos all within normal limits he does have slightly elevated liver enzymes but this appears to be at his baseline this is likely secondary due to alcohol consumption.suspicion for diverticulitis, bowel obstruction, intra-abdominal abscess, AAA, kidney stone, pyelonephritis is low at this time as CT imaging is all negative for these findings.  I doubt alcohol withdrawals as he is nontremulous on my exam, he denies feeling anxious, CIWA is unremarkable, he was tachycardic but this is likely secondary  to pain from pancreatitis   Dispostion and problem list  Due to shift change patient be handed off to [facility] PAC  Once potassium is finished, reassessed the patient, as long as pain is under control and he is tolerating p.o. he can be discharged home, pain medication antiemetics, and follow-up with primary care doctor for reassessment of his hypokalemia.           Final Clinical Impression(s) / ED Diagnoses Final diagnoses:  Alcohol-induced acute pancreatitis without infection or necrosis  Hypokalemia    Rx / DC Orders ED Discharge Orders          Ordered    ondansetron (ZOFRAN) 4 MG tablet  Every 6 hours        11/02/22 0539    dicyclomine (BENTYL) 20 MG tablet  2 times daily PRN        11/02/22 0539    oxyCODONE-acetaminophen (PERCOCET/ROXICET) 5-325 MG tablet  Every 8 hours PRN        11/02/22 0539              Marcello Fennel, PA-C 11/02/22 3976    Maudie Flakes, MD 11/02/22 [phone]

## 2022-11-02 NOTE — ED Provider Notes (Signed)
  Physical Exam  BP (!) 149/87   Pulse 84   Temp 97.8 F (36.6 C) (Oral)   Resp 17   Ht '5\' 7"'$  (1.702 m)   Wt 54.4 kg   SpO2 98%   BMI 18.79 kg/m   Physical Exam  Procedures  Procedures  ED Course / MDM    Medical Decision Making Amount and/or Complexity of Data Reviewed Labs: ordered.  Risk Prescription drug management.   Patient care handed off from [facility] at shift change.  See prior note for full details.  In short, patient with pancreatitis given IV fluids as well as pain medication.  Patient tolerating p.o. at this time but having potassium replaced.  To reassess with expectant discharge.\ In short, patient presents with 3 days of epigastric pain without radiation.  Notes associated vomiting and nausea.  Patient with history of alcohol intake prior to symptom onset.  Denies fever, chills, night sweats, chest pain, shortness of breath, urinary symptoms, change in bowel habits, hematemesis.  Laboratory studies: Patient with multiple electrolyte abnormalities including decrease in chloride of 95, potassium 2.9 of which was supplemented orally as well as IV, sodium 134, patient was given 1 L normal saline as well as potassium supplementation.  Magnesium within normal range.  Ethanol level within normal range.  Lipase 7-10.  Mild leukocytosis of 11.4 as well as thrombocytosis of 414 with no evidence of anemia.  UA significant for rare bacteria, 5 ketones and moderate hemoglobin of which patient is currently endorsing no urinary symptoms.  Imaging studies: CT abdomen pelvis: Acute pancreatitis  Acute pancreatitis Vital signs significant for hypertension with blood pressure 161/85 recommend follow-up with primary care regarding elevation blood pressure.  Patient initially tachypneic of which decreased with pain medicine administered, antiemetic. Patient symptoms most likely secondary to alcohol induced pancreatitis.  Patient responded well to medicines administered  while emergency department and is able to tolerate p.o.  Shared decision-making conversation was had and patient elected for outpatient management of symptoms with oral pain medication and antiemetic.  Reevaluation of the patient showed significant improvement of patient's abdominal pain with no repeat bouts of emesis.  Patient prescribed pain medication as well as antiemetic.  Recommend follow-up with primary care for reevaluation of symptoms.  Treatment plan discussed at length with patient and he acknowledged understanding was agreeable to said plan.  Worrisome signs and symptoms were discussed with the patient and the patient acknowledged understanding was agreeable to said plan.  Patient stable upon discharge from the ED.   Wilnette Kales, [place] 11/02/22 1610    Fransico Meadow, MD 11/06/22 [phone]

## 2022-11-08 NOTE — Progress Notes (Signed)
GU Location of Tumor / Histology: Prostate Ca  If Prostate Cancer, Gleason Score is (3 + 4) and PSA is (8.7 on 08/04/2021)  Biopsies       Past/Anticipated interventions by urology, if any: Had follow-up on 10/19/2022.  Past/Anticipated interventions by medical oncology, if any: NA  Weight changes, if any: {:18581}  Bowel/Bladder complaints, if any: {:18581}   Nausea/Vomiting, if any: {:18581}  Pain issues, if any:  {:18581}  SAFETY ISSUES: Prior radiation? {:18581} Pacemaker/ICD? {:18581} Possible current pregnancy? Male Is the patient on methotrexate? No  Current Complaints / other details:

## 2022-11-09 DIAGNOSIS — C61 Malignant neoplasm of prostate: Secondary | ICD-10-CM | POA: Insufficient documentation

## 2022-11-10 ENCOUNTER — Ambulatory Visit
Admission: RE | Admit: 2022-11-10 | Discharge: 2022-11-10 | Disposition: A | Discharge status: Home / Self Care | Payer: No Typology Code available for payment source | Source: Ambulatory Visit | Attending: Radiation Oncology | Admitting: Radiation Oncology

## 2022-11-10 ENCOUNTER — Ambulatory Visit
Admission: RE | Admit: 2022-11-10 | Discharge: 2022-11-10 | Disposition: A | Discharge status: Home / Self Care | Payer: Self-pay | Source: Ambulatory Visit | Attending: Radiation Oncology | Admitting: Radiation Oncology

## 2022-11-10 ENCOUNTER — Other Ambulatory Visit: Payer: Self-pay

## 2022-11-10 ENCOUNTER — Other Ambulatory Visit: Payer: Self-pay | Admitting: Radiation Oncology

## 2022-11-10 VITALS — BP 116/67 | HR 106 | Temp 96.7°F | Resp 18 | Ht 67.0 in | Wt 111.2 lb

## 2022-11-10 DIAGNOSIS — C61 Malignant neoplasm of prostate: Secondary | ICD-10-CM

## 2022-11-10 DIAGNOSIS — Z79899 Other long term (current) drug therapy: Secondary | ICD-10-CM | POA: Diagnosis not present

## 2022-11-10 DIAGNOSIS — K859 Acute pancreatitis without necrosis or infection, unspecified: Secondary | ICD-10-CM | POA: Diagnosis not present

## 2022-11-10 DIAGNOSIS — K219 Gastro-esophageal reflux disease without esophagitis: Secondary | ICD-10-CM | POA: Insufficient documentation

## 2022-11-10 DIAGNOSIS — Z7984 Long term (current) use of oral hypoglycemic drugs: Secondary | ICD-10-CM | POA: Insufficient documentation

## 2022-11-10 DIAGNOSIS — F17210 Nicotine dependence, cigarettes, uncomplicated: Secondary | ICD-10-CM | POA: Diagnosis not present

## 2022-11-10 DIAGNOSIS — Z7982 Long term (current) use of aspirin: Secondary | ICD-10-CM | POA: Insufficient documentation

## 2022-11-10 NOTE — Progress Notes (Signed)
Radiation Oncology         [phone]) [phone] ________________________________  Initial Outpatient Consultation  Name: Stephen [name] MRN: [id number]  Date: 11/10/2022  DOB: [birth year]  NW:GNFAOZHY, Charisse March, MD  Alphia Kava, MD   REFERRING PHYSICIAN: Alphia Kava, MD  DIAGNOSIS: 62 y.o. gentleman with Stage T1c adenocarcinoma of the prostate with Gleason score of 3+4, and PSA of 8.7.    ICD-10-CM   1. Malignant neoplasm of prostate ([place])  C61       HISTORY OF PRESENT ILLNESS: Stephen [name] is a 62 y.o. male with a diagnosis of prostate cancer. He was noted to have an elevated PSA of 8.7 in 07/2021, by his primary care physician at the [facility]. This was increased from 4.19 in 02/2021.  Accordingly, he was referred for evaluation in urology by Dr. Domenica Fail in 09/2021,  digital rectal examination was performed at that time revealing no concerning findings or nodules.  The patient proceeded to transrectal ultrasound with 12 biopsies of the prostate on 05/04/22 which showed atypia only. A prostate MRI was performed in February 2023 showing a PI-RADS 3 lesion in the left mid gland peripheral zone.  A MRI fusion biopsy was performed on 09/13/2022.  The prostate volume measured 28 cc.  Out of 14 core biopsies, 12 were positive.  The maximum Gleason score was 3+4, and this was seen in 10 of the 12 positive cores bilaterally.  Both samples from the MRI ROI showed Gleason 3+3.  The patient reviewed the biopsy results with his urologist and he has kindly been referred today for discussion of potential radiation treatment options.  He has also been referred to Dr. Tresa Moore, at [facility], to discuss surgical options and is scheduled for a consult visit on 11/22/2022.  He is accompanied by his brother, Damita Dunnings, for today's visit.   PREVIOUS RADIATION THERAPY: No  PAST MEDICAL HISTORY:  Past Medical History:  Diagnosis Date   Arthritis    GERD (gastroesophageal reflux disease)    Pancreatitis     Tuberculosis    yrs. ago pt. states he was dx. here at [facility]: Past Surgical History:  Procedure Laterality Date   FRACTURE SURGERY     jaw   REVERSE SHOULDER ARTHROPLASTY Left 12/11/2018   Procedure: LEFT REVERSE SHOULDER ARTHROPLASTY;  Surgeon: Meredith Pel, MD;  Location: [place];  Service: Orthopedics;  Laterality: Left;   REVERSE SHOULDER ARTHROPLASTY Right 09/03/2019   REVERSE SHOULDER ARTHROPLASTY Right 09/03/2019   Procedure: RIGHT REVERSE SHOULDER ARTHROPLASTY;  Surgeon: Meredith Pel, MD;  Location: [place];  Service: Orthopedics;  Laterality: Right;    FAMILY HISTORY: No family history on file.  SOCIAL HISTORY:  Social History   Socioeconomic History   Marital status: Single    Spouse name: Not on file   Number of children: Not on file   Years of education: Not on file   Highest education level: Not on file  Occupational History   Not on file  Tobacco Use   Smoking status: Every Day    Years: 40.00    Types: Cigarettes   Smokeless tobacco: Never   Tobacco comments:    2-3  cigarettes per day  Vaping Use   Vaping Use: Never used  Substance and Sexual Activity   Alcohol use: Yes    Comment: 1-2 beers daily.    Drug use: Yes    Types: Marijuana    Comment: 1 joint every other  day   Sexual activity: Not on file  Other Topics Concern   Not on file  Social History Narrative   Not on file   Social Determinants of Health   Financial Resource Strain: Not on file  Food Insecurity: Not on file  Transportation Needs: Not on file  Physical Activity: Not on file  Stress: Not on file  Social Connections: Not on file  Intimate Partner Violence: Not on file    ALLERGIES: Patient has no known allergies.  MEDICATIONS:  Current Outpatient Medications  Medication Sig Dispense Refill   lipase/protease/amylase (CREON) 36000 UNITS CPEP capsule TAKE 2 CAPSULES BY MOUTH THREE TIMES A DAY WITH MEALS AND ONE CAPSULE WITH SNACKS      metFORMIN (GLUCOPHAGE) 500 MG tablet TAKE ONE TABLET BY MOUTH DAILY FOR DIABETES (ANNUAL KIDNEY FUNCTION TESTING IS NEEDED)     omeprazole (PRILOSEC) 40 MG capsule Take 1 capsule by mouth daily.     Potassium Chloride 10 MEQ PACK TAKE ONE TABLET BY MOUTH DAILY FOR LOW POTASSIUM (TAKE WITH FOOD)     rosuvastatin (CRESTOR) 40 MG tablet TAKE ONE TABLET BY MOUTH AT BEDTIME FOR CHOLESTEROL     sildenafil (VIAGRA) 100 MG tablet TAKE ONE-HALF TABLET BY MOUTH AS DIRECTED FOR ERECTILE DYSFUNCTION (TAKE 1 HOUR PRIOR TO SEXUAL ACTIVITY *DO NOT EXCEED 1 DOSE PER 24 HOUR PERIOD*)     ASPIRIN LOW DOSE 81 MG EC tablet TAKE 1 TABLET BY MOUTH EVERY DAY 90 tablet 1   chlorthalidone (HYGROTON) 25 MG tablet Take 1 tablet (25 mg total) by mouth daily. 90 tablet 3   diclofenac Sodium (VOLTAREN) 1 % GEL Apply 4 g topically 4 (four) times daily. 100 g 3   dicyclomine (BENTYL) 20 MG tablet Take 1 tablet (20 mg total) by mouth 2 (two) times daily as needed for spasms. 20 tablet 0   methocarbamol (ROBAXIN) 500 MG tablet TAKE 1 TABLET BY MOUTH EVERY 8 HOURS AS NEEDED FOR MUSCLE SPASMS. 30 tablet 0   ondansetron (ZOFRAN ODT) 4 MG disintegrating tablet Take 1 tablet (4 mg total) by mouth every 8 (eight) hours as needed for nausea or vomiting. 6 tablet 0   ondansetron (ZOFRAN) 4 MG tablet Take 1 tablet (4 mg total) by mouth every 6 (six) hours. 12 tablet 0   pantoprazole (PROTONIX) 40 MG tablet Take 1 tablet (40 mg total) by mouth daily. 15 tablet 0   promethazine (PHENERGAN) 25 MG suppository Place 1 suppository (25 mg total) rectally every 6 (six) hours as needed for nausea or vomiting. 12 each 0   thiamine 100 MG tablet Take 1 tablet (100 mg total) by mouth daily. 90 tablet 3   No current facility-administered medications for this visit.    REVIEW OF SYSTEMS:  On review of systems, the patient reports that he is doing well overall. He denies any chest pain, shortness of breath, cough, fevers, chills, night sweats, unintended  weight changes. He denies any bowel disturbances, and denies abdominal pain, nausea or vomiting. He denies any new musculoskeletal or joint aches or pains. His IPSS was 21, indicating severe urinary symptoms following his biopsy but he reports that these are gradually improving. His SHIM was 15, indicating he has moderate erectile dysfunction. A complete review of systems is obtained and is otherwise negative.    PHYSICAL EXAM:  Wt Readings from Last 3 Encounters:  11/01/22 120 lb (54.4 kg)  03/25/21 114 lb 10.2 oz (52 kg)  07/20/20 114 lb 9.6 oz (52 kg)  Temp Readings from Last 3 Encounters:  11/02/22 97.7 F (36.5 C) (Oral)  01/02/21 98.6 F (37 C) (Oral)  07/20/20 98.6 F (37 C) (Oral)   BP Readings from Last 3 Encounters:  11/02/22 (!) 161/85  01/02/21 (!) 142/94  07/20/20 (!) 144/83   Pulse Readings from Last 3 Encounters:  11/02/22 82  01/02/21 90  07/20/20 80    /10  In general this is a well appearing African-American male in no acute distress. He's alert and oriented x4 and appropriate throughout the examination. Cardiopulmonary assessment is negative for acute distress, and he exhibits normal effort.     KPS = 100  100 - Normal; no complaints; no evidence of disease. 90   - Able to carry on normal activity; minor signs or symptoms of disease. 80   - Normal activity with effort; some signs or symptoms of disease. 32   - Cares for self; unable to carry on normal activity or to do active work. 60   - Requires occasional assistance, but is able to care for most of his personal needs. 50   - Requires considerable assistance and frequent medical care. 94   - Disabled; requires special care and assistance. 5   - Severely disabled; hospital admission is indicated although death not imminent. 105   - Very sick; hospital admission necessary; active supportive treatment necessary. 10   - Moribund; fatal processes progressing rapidly. 0     - Dead  Karnofsky DA, [place]  [place], Craver LS and Burchenal [facility] [phone]) The use of the nitrogen mustards in the palliative treatment of carcinoma: with particular reference to bronchogenic carcinoma Cancer 1 634-56  LABORATORY DATA:  Lab Results  Component Value Date   WBC 11.4 (H) 11/01/2022   HGB 15.3 11/01/2022   HCT 46.1 11/01/2022   MCV 85.8 11/01/2022   PLT 414 (H) 11/01/2022   Lab Results  Component Value Date   NA 134 (L) 11/01/2022   K 2.9 (L) 11/01/2022   CL 95 (L) 11/01/2022   CO2 25 11/01/2022   Lab Results  Component Value Date   ALT 54 (H) 11/01/2022   AST 42 (H) 11/01/2022   ALKPHOS 83 11/01/2022   BILITOT 0.7 11/01/2022     RADIOGRAPHY: CT Abdomen Pelvis W Contrast  Result Date: 11/01/2022 CLINICAL DATA:  Patient states he has pancreatitis. Multiple vomiting episodes. Epigastric pain EXAM: CT ABDOMEN AND PELVIS WITH CONTRAST TECHNIQUE: Multidetector CT imaging of the abdomen and pelvis was performed using the standard protocol following bolus administration of intravenous contrast. RADIATION DOSE REDUCTION: This exam was performed according to the departmental dose-optimization program which includes automated exposure control, adjustment of the mA and/or kV according to patient size and/or use of iterative reconstruction technique. CONTRAST:  62m OMNIPAQUE IOHEXOL 300 MG/ML  SOLN COMPARISON:  10/16/2018 FINDINGS: Lower chest: No acute abnormality. Hepatobiliary: No focal liver abnormality is seen. No gallstones, gallbladder wall thickening, or biliary dilatation. Pancreas: Acute peripancreatic fluid and stranding greatest about the pancreatic head. Unchanged mild prominence of the main pancreatic duct. No evidence of parenchymal necrosis. Patent portal vein. Spleen: Normal in size without focal abnormality. Adrenals/Urinary Tract: Adrenal glands are unremarkable. Kidneys are normal, without renal calculi, focal lesion, or hydronephrosis. Bladder is unremarkable. Stomach/Bowel: Stomach is within normal  limits. Appendix appears normal. No evidence of bowel wall thickening, distention, or inflammatory changes. Vascular/Lymphatic: Aortic atherosclerosis. No enlarged abdominal or pelvic lymph nodes. Reproductive: Prostate is unremarkable. Other: No free intraperitoneal air. Musculoskeletal: Advanced degenerative disc  disease at L2-L3. Bilateral femoral head AVN. No acute osseous abnormality. IMPRESSION: Acute pancreatitis. Electronically Signed   By: Placido Sou M.D.   On: 11/01/2022 21:51      IMPRESSION/PLAN: 1. 62 y.o. gentleman with Stage T1c adenocarcinoma of the prostate with Gleason Score of 3+4, and PSA of 8.7. We discussed the patient's workup and outlined the nature of prostate cancer in this setting. The patient's T stage, Gleason's score, and PSA put him into the unfavorable intermediate risk group. Accordingly, he is eligible for a variety of potential treatment options including brachytherapy, 5.5 weeks of external radiation +/- ST-ADT, or prostatectomy. We discussed the available radiation techniques, and focused on the details and logistics of delivery. The patient may not be an ideal candidate for brachytherapy boost with a pre-procedure IPSS score of 21 although he does feel like the urinary symptoms were aggravated by the biopsy and gradually improving.  He is not currently taking any medications for his prostate. We discussed and outlined the risks, benefits, short and long-term effects associated with radiotherapy and compared and contrasted these with prostatectomy. We discussed the role of SpaceOAR gel in reducing the rectal toxicity associated with radiotherapy. We also detailed the role of ADT in the treatment of unfavorable intermediate risk prostate cancer and outlined the associated side effects that could be expected with this therapy. He prefers to avoid ADT now and reserve for use at a later date should his PSA rise despite his initial definitive treatment.  He appears to have a  good understanding of his disease and our treatment recommendations which are of curative intent.  He was encouraged to ask questions that were answered to his stated satisfaction.  At the conclusion of our conversation, the patient is undecided but appears to be leaning towards proceeding with a 5-1/2-week course of daily external beam radiation.  He would like to take some additional time to consider his options and although he is adamantly not interested in any surgical procedures, we have strongly recommended that he keep his scheduled consult visit with Dr. Tresa Moore on 11/22/2022 to learn about all of his options as well as consideration for fiducial marker/SpaceOAR gel placement should he ultimately elect to proceed with the external beam radiation.  He has our contact information and will let us know once he has reached a final decision so that we can proceed with treatment planning accordingly at that time.  We will send a copy of today's visit with his team at the [facility] and keep them informed going forward.  We enjoyed meeting him and his brother today and look forward to continuing to participate in his care.  We personally spent 60 minutes in this encounter including chart review, reviewing radiological studies, meeting face-to-face with the patient, entering orders and completing documentation.    Nicholos Johns, PA-C    Tyler Pita, MD  [facility] Direct Dial: [phone]  Fax: [phone] .com  Skype  LinkedIn

## 2022-11-14 NOTE — Progress Notes (Addendum)
Introduced myself to the patient as the prostate nurse navigator.  No barriers to care identified at this time.  He is here to discuss his radiation treatment options.  I gave him my business card and asked him to call me with questions or concerns.  Verbalized understanding.  Patient has a follow up with Dr. Tresa Moore @ [facility] on 11/22/2022.  RN will follow up after appointment to ensure treatment decision is finalized.   Progress notes successfully faxed to [facility] for continuity of care.

## 2022-11-22 ENCOUNTER — Telehealth: Payer: Self-pay | Admitting: *Deleted

## 2022-11-22 NOTE — Telephone Encounter (Signed)
RETURNED PATIENT'S PHONE CALL, SPOKE WITH PATIENT. ?

## 2022-12-22 NOTE — Progress Notes (Signed)
RN left voicemail for call back to see if patient has finalized a treatment decision.

## 2022-12-27 NOTE — Progress Notes (Signed)
RN spoke with patient.  Patient remains undecided with his treatment decision.  He did confirm that he plans on following up with Dr. Tresa Moore next month, 3/8, and hopes to finalize decision at that time.    No additional questions at this time.  Will follow up after MD visit on 3/8.

## 2023-01-16 ENCOUNTER — Other Ambulatory Visit: Payer: Self-pay | Admitting: Urology

## 2023-01-16 NOTE — Progress Notes (Signed)
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   Patient has decided to proceed with surgery after seeing Dr. Tresa Moore @ [facility] on 3/8.

## 2023-01-26 ENCOUNTER — Encounter (HOSPITAL_COMMUNITY): Payer: Self-pay

## 2023-01-26 NOTE — Patient Instructions (Addendum)
SURGICAL WAITING ROOM VISITATION Patients having surgery or a procedure may have no more than 2 support people in the waiting area - these visitors may rotate.    Children under the age of 71 must have an adult with them who is not the patient.  If the patient needs to stay at the hospital during part of their recovery, the visitor guidelines for inpatient rooms apply. Pre-op nurse will coordinate an appropriate time for 1 support person to accompany patient in pre-op.  This support person may not rotate.    Please refer to the [facility] website for the visitor guidelines for Inpatients (after your surgery is over and you are in a regular room).    Your procedure is scheduled on: 02-15-23   Report to [facility] Main Entrance    Report to admitting at 9:45 AM   Call this number if you have problems the morning of surgery [phone]   Follow a clear liquid diet the day before surgery   Do not eat food or drink liquids :After Midnight.           If you have questions, please contact your surgeon's office.   FOLLOW BOWEL PREP AND ANY ADDITIONAL PRE OP INSTRUCTIONS YOU RECEIVED FROM YOUR SURGEON'S OFFICE!!!  Magnesium Citrate - Drink one bottle by noon the day before surgery      Oral Hygiene is also important to reduce your risk of infection.                                    Remember - BRUSH YOUR TEETH THE MORNING OF SURGERY WITH YOUR REGULAR TOOTHPASTE   Do NOT smoke after Midnight   Take these medicines the morning of surgery with A SIP OF WATER:   Omeprazole  Pantoprazole  Ondansetron if needed  DO NOT TAKE ANY ORAL DIABETIC MEDICATIONS DAY OF YOUR SURGERY  Bring CPAP mask and tubing day of surgery.                              You may not have any metal on your body including  jewelry, and body piercing             Do not wear lotions, powders, cologne, or deodorant              Men may shave face and neck.   Do not bring valuables to the hospital. [place].   Contacts, dentures or bridgework may not be worn into surgery.   Bring small overnight bag day of surgery.   DO NOT [place]. PHARMACY WILL DISPENSE MEDICATIONS LISTED ON YOUR MEDICATION LIST TO YOU DURING YOUR ADMISSION [place]!    Special Instructions: Bring a copy of your healthcare power of attorney and living will documents the day of surgery if you haven't scanned them before.              Please read over the following fact sheets you were given: IF [place]  If you received a COVID test during your pre-op visit  it is requested that you wear a mask when out in public, stay away from anyone that may not be feeling well and notify your surgeon if you  develop symptoms. If you test positive for Covid or have been in contact with anyone that has tested positive in the last 10 days please notify you surgeon.  Early - Preparing for Surgery Before surgery, you can play an important role.  Because skin is not sterile, your skin needs to be as free of germs as possible.  You can reduce the number of germs on your skin by washing with CHG (chlorahexidine gluconate) soap before surgery.  CHG is an antiseptic cleaner which kills germs and bonds with the skin to continue killing germs even after washing. Please DO NOT use if you have an allergy to CHG or antibacterial soaps.  If your skin becomes reddened/irritated stop using the CHG and inform your nurse when you arrive at Short Stay. Do not shave (including legs and underarms) for at least 48 hours prior to the first CHG shower.  You may shave your face/neck.  Please follow these instructions carefully:  1.  Shower with CHG Soap the night before surgery and the  morning of surgery.  2.  If you choose to wash your hair, wash your hair first as usual with your normal  shampoo.  3.  After  you shampoo, rinse your hair and body thoroughly to remove the shampoo.                             4.  Use CHG as you would any other liquid soap.  You can apply chg directly to the skin and wash.  Gently with a scrungie or clean washcloth.  5.  Apply the CHG Soap to your body ONLY FROM THE NECK DOWN.   Do   not use on face/ open                           Wound or open sores. Avoid contact with eyes, ears mouth and   genitals (private parts).                       Wash face,  Genitals (private parts) with your normal soap.             6.  Wash thoroughly, paying special attention to the area where your    surgery  will be performed.  7.  Thoroughly rinse your body with warm water from the neck down.  8.  DO NOT shower/wash with your normal soap after using and rinsing off the CHG Soap.                9.  Pat yourself dry with a clean towel.            10.  Wear clean pajamas.            11.  Place clean sheets on your bed the night of your first shower and do not  sleep with pets. Day of Surgery : Do not apply any lotions/deodorants the morning of surgery.  Please wear clean clothes to the hospital/surgery center.  FAILURE TO FOLLOW THESE INSTRUCTIONS MAY RESULT IN THE CANCELLATION OF YOUR SURGERY  PATIENT SIGNATURE_________________________________  NURSE SIGNATURE__________________________________  ________________________________________________________________________

## 2023-02-01 NOTE — Progress Notes (Signed)
COVID Vaccine Completed:  Yes  Date of COVID positive in last 90 days:  PCP - Linwood Dibbles, MD Cardiologist -   Chest x-ray -  EKG -  Stress Test -  ECHO -  Cardiac Cath -  Pacemaker/ICD device last checked: Spinal Cord Stimulator:  Bowel Prep -   Sleep Study -  CPAP -   Fasting Blood Sugar -  Checks Blood Sugar _____ times a day  Last dose of GLP1 agonist-  N/A GLP1 instructions:  N/A   Last dose of SGLT-2 inhibitors-  N/A SGLT-2 instructions: N/A   Blood Thinner Instructions: Aspirin Instructions: Last Dose:  Activity level:  Can go up a flight of stairs and perform activities of daily living without stopping and without symptoms of chest pain or shortness of breath.  Able to exercise without symptoms  Unable to go up a flight of stairs without symptoms of     Anesthesia review:   Patient denies shortness of breath, fever, cough and chest pain at PAT appointment  Patient verbalized understanding of instructions that were given to them at the PAT appointment. Patient was also instructed that they will need to review over the PAT instructions again at home before surgery.

## 2023-02-06 ENCOUNTER — Encounter (HOSPITAL_COMMUNITY)
Admission: RE | Admit: 2023-02-06 | Discharge: 2023-02-06 | Disposition: A | Discharge status: Home / Self Care | Payer: No Typology Code available for payment source | Source: Ambulatory Visit | Attending: Anesthesiology | Admitting: Anesthesiology

## 2023-02-06 DIAGNOSIS — Z789 Other specified health status: Secondary | ICD-10-CM

## 2023-02-06 DIAGNOSIS — Z01818 Encounter for other preprocedural examination: Secondary | ICD-10-CM

## 2023-02-06 DIAGNOSIS — I10 Essential (primary) hypertension: Secondary | ICD-10-CM

## 2023-02-10 NOTE — Patient Instructions (Signed)
DUE TO COVID-19 ONLY TWO VISITORS  (aged 64 and older)  ARE ALLOWED TO COME WITH YOU AND STAY IN THE WAITING ROOM ONLY DURING PRE OP AND PROCEDURE.   **NO VISITORS ARE ALLOWED IN THE SHORT STAY AREA OR RECOVERY ROOM!!**  IF YOU WILL BE ADMITTED INTO THE HOSPITAL YOU ARE ALLOWED ONLY FOUR SUPPORT PEOPLE DURING VISITATION HOURS ONLY (7 AM -8PM)   The support person(s) must pass our screening, gel in and out, and wear a mask at all times, including in the patient's room. Patients must also wear a mask when staff or their support person are in the room. Visitors GUEST BADGE MUST BE WORN VISIBLY  One adult visitor may remain with you overnight and MUST be in the room by 8 P.M.     Your procedure is scheduled on: 02/15/23   Report to [facility] Main Entrance    Report to admitting at : 9:45 AM   Call this number if you have problems the morning of surgery [phone]   Clear liquids starting the day before surgery until 9:00 AM the morning of surgery  FOLLOW BOWEL PREP AND ANY ADDITIONAL PRE OP INSTRUCTIONS YOU RECEIVED FROM YOUR SURGEON'S OFFICE!!!   Oral Hygiene is also important to reduce your risk of infection.                                    Remember - BRUSH YOUR TEETH THE MORNING OF SURGERY WITH YOUR REGULAR TOOTHPASTE  DENTURES WILL BE REMOVED PRIOR TO SURGERY PLEASE DO NOT APPLY "Poly grip" OR ADHESIVES!!!   Do NOT smoke after Midnight   Take these medicines the morning of surgery with A SIP OF WATER: omeprazole,pantoprazole.Ondansetron as needed.  DO NOT TAKE ANY ORAL DIABETIC MEDICATIONS DAY OF YOUR SURGERY                              You may not have any metal on your body including hair pins, jewelry, and body piercing             Do not wear lotions, powders, perfumes/cologne, or deodorant              Men may shave face and neck.   Do not bring valuables to the hospital. [facility] IS NOT             RESPONSIBLE   FOR VALUABLES.   Contacts, glasses, or  bridgework may not be worn into surgery.   Bring small overnight bag day of surgery.   DO NOT BRING YOUR HOME MEDICATIONS TO THE HOSPITAL. PHARMACY WILL DISPENSE MEDICATIONS LISTED ON YOUR MEDICATION LIST TO YOU DURING YOUR ADMISSION IN THE HOSPITAL!    Patients discharged on the day of surgery will not be allowed to drive home.  Someone NEEDS to stay with you for the first 24 hours after anesthesia.   Special Instructions: Bring a copy of your healthcare power of attorney and living will documents         the day of surgery if you haven't scanned them before.              Please read over the following fact sheets you were given: IF YOU HAVE QUESTIONS ABOUT YOUR PRE-OP INSTRUCTIONS PLEASE CALL [phone]    [facility] - Preparing for Surgery Before surgery, you can play an  important role.  Because skin is not sterile, your skin needs to be as free of germs as possible.  You can reduce the number of germs on your skin by washing with CHG (chlorahexidine gluconate) soap before surgery.  CHG is an antiseptic cleaner which kills germs and bonds with the skin to continue killing germs even after washing. Please DO NOT use if you have an allergy to CHG or antibacterial soaps.  If your skin becomes reddened/irritated stop using the CHG and inform your nurse when you arrive at Short Stay. Do not shave (including legs and underarms) for at least 48 hours prior to the first CHG shower.  You may shave your face/neck. Please follow these instructions carefully:  1.  Shower with CHG Soap the night before surgery and the  morning of Surgery.  2.  If you choose to wash your hair, wash your hair first as usual with your  normal  shampoo.  3.  After you shampoo, rinse your hair and body thoroughly to remove the  shampoo.                           4.  Use CHG as you would any other liquid soap.  You can apply chg directly  to the skin and wash                       Gently with a scrungie or clean  washcloth.  5.  Apply the CHG Soap to your body ONLY FROM THE NECK DOWN.   Do not use on face/ open                           Wound or open sores. Avoid contact with eyes, ears mouth and genitals (private parts).                       Wash face,  Genitals (private parts) with your normal soap.             6.  Wash thoroughly, paying special attention to the area where your surgery  will be performed.  7.  Thoroughly rinse your body with warm water from the neck down.  8.  DO NOT shower/wash with your normal soap after using and rinsing off  the CHG Soap.                9.  Pat yourself dry with a clean towel.            10.  Wear clean pajamas.            11.  Place clean sheets on your bed the night of your first shower and do not  sleep with pets. Day of Surgery : Do not apply any lotions/deodorants the morning of surgery.  Please wear clean clothes to the hospital/surgery center.  FAILURE TO FOLLOW THESE INSTRUCTIONS MAY RESULT IN THE CANCELLATION OF YOUR SURGERY PATIENT SIGNATURE_________________________________  NURSE SIGNATURE__________________________________  ________________________________________________________________________

## 2023-02-13 ENCOUNTER — Encounter (HOSPITAL_COMMUNITY)
Admission: RE | Admit: 2023-02-13 | Discharge: 2023-02-13 | Disposition: A | Discharge status: Home / Self Care | Payer: No Typology Code available for payment source | Source: Ambulatory Visit | Attending: Urology | Admitting: Urology

## 2023-02-13 ENCOUNTER — Encounter (HOSPITAL_COMMUNITY): Payer: Self-pay

## 2023-02-13 ENCOUNTER — Other Ambulatory Visit: Payer: Self-pay

## 2023-02-13 VITALS — BP 142/86 | HR 95 | Temp 98.2°F | Ht 67.0 in | Wt 107.0 lb

## 2023-02-13 DIAGNOSIS — I10 Essential (primary) hypertension: Secondary | ICD-10-CM | POA: Diagnosis not present

## 2023-02-13 DIAGNOSIS — Z789 Other specified health status: Secondary | ICD-10-CM | POA: Diagnosis not present

## 2023-02-13 DIAGNOSIS — Z01818 Encounter for other preprocedural examination: Secondary | ICD-10-CM | POA: Diagnosis present

## 2023-02-13 DIAGNOSIS — R7303 Prediabetes: Secondary | ICD-10-CM | POA: Diagnosis not present

## 2023-02-13 DIAGNOSIS — I491 Atrial premature depolarization: Secondary | ICD-10-CM | POA: Diagnosis not present

## 2023-02-13 HISTORY — DX: Malignant (primary) neoplasm, unspecified: C80.1

## 2023-02-13 HISTORY — DX: Chronic kidney disease, unspecified: N18.9

## 2023-02-13 LAB — CBC
HCT: 42.7 % (ref 39.0–52.0)
Hemoglobin: 14.2 g/dL (ref 13.0–17.0)
MCH: 28.8 pg (ref 26.0–34.0)
MCHC: 33.3 g/dL (ref 30.0–36.0)
MCV: 86.6 fL (ref 80.0–100.0)
Platelets: 408 10*3/uL — ABNORMAL HIGH (ref 150–400)
RBC: 4.93 MIL/uL (ref 4.22–5.81)
RDW: 12.6 % (ref 11.5–15.5)
WBC: 7.2 10*3/uL (ref 4.0–10.5)
nRBC: 0 % (ref 0.0–0.2)

## 2023-02-13 LAB — COMPREHENSIVE METABOLIC PANEL
ALT: 36 U/L (ref 0–44)
AST: 46 U/L — ABNORMAL HIGH (ref 15–41)
Albumin: 4.4 g/dL (ref 3.5–5.0)
Alkaline Phosphatase: 94 U/L (ref 38–126)
Anion gap: 12 (ref 5–15)
BUN: 14 mg/dL (ref 8–23)
CO2: 29 mmol/L (ref 22–32)
Calcium: 9.6 mg/dL (ref 8.9–10.3)
Chloride: 94 mmol/L — ABNORMAL LOW (ref 98–111)
Creatinine, Ser: 1.1 mg/dL (ref 0.61–1.24)
GFR, Estimated: >60 mL/min (ref >60)
Glucose, Bld: 121 mg/dL — ABNORMAL HIGH (ref 70–99)
Potassium: 3.5 mmol/L (ref 3.5–5.1)
Sodium: 135 mmol/L (ref 135–145)
Total Bilirubin: 0.6 mg/dL (ref 0.3–1.2)
Total Protein: 8.3 g/dL — ABNORMAL HIGH (ref 6.5–8.1)

## 2023-02-13 LAB — HEMOGLOBIN A1C
Hgb A1c MFr Bld: 6.6 % — ABNORMAL HIGH (ref 4.8–5.6)
Mean Plasma Glucose: 142.72 mg/dL

## 2023-02-13 LAB — GLUCOSE, CAPILLARY: Glucose-Capillary: 114 mg/dL — ABNORMAL HIGH (ref 70–99)

## 2023-02-13 NOTE — Progress Notes (Addendum)
For Short Stay: COVID SWAB appointment date:  Bowel Prep reminder: Reviewed with pt. And hid brother.   For Anesthesia: PCP - Steffanie Rainwater, MD  Cardiologist - N/A  Chest x-ray -  EKG -  Stress Test -  ECHO -  Cardiac Cath -  Pacemaker/ICD device last checked: Pacemaker orders received: Device Rep notified:  Spinal Cord Stimulator:  Sleep Study - N/A CPAP -   Fasting Blood Sugar - N/A Checks Blood Sugar ___0__ times a day Date and result of last Hgb A1c-  Last dose of GLP1 agonist- N/A GLP1 instructions:   Last dose of SGLT-2 inhibitors-  SGLT-2 instructions:   Blood Thinner Instructions: Aspirin Instructions: Last Dose:  Activity level: Can go up a flight of stairs and activities of daily living without stopping and without chest pain and/or shortness of breath   Able to exercise without chest pain and/or shortness of breath  Anesthesia review: Hx: HTN,Pre-DIA.,Smoker.  Patient denies shortness of breath, fever, cough and chest pain at PAT appointment   Patient verbalized understanding of instructions that were given to them at the PAT appointment. Patient was also instructed that they will need to review over the PAT instructions again at home before surgery.

## 2023-02-14 NOTE — Anesthesia Preprocedure Evaluation (Signed)
Anesthesia Evaluation  Patient identified by MRN, date of birth, ID band Patient awake    Reviewed: Allergy & Precautions, NPO status , Patient's Chart, lab work & pertinent test results  Airway Mallampati: II  TM Distance: >3 FB Neck ROM: Full    Dental no notable dental hx. (+) Upper Dentures,    Pulmonary Current Smoker and Patient abstained from smoking.   Pulmonary exam normal breath sounds clear to auscultation       Cardiovascular hypertension, Normal cardiovascular exam Rhythm:Regular Rate:Normal     Neuro/Psych    GI/Hepatic ,GERD  Medicated and Controlled,,  Endo/Other  negative endocrine ROS    Renal/GU Renal diseaseLab Results      Component                Value               Date                      CREATININE               1.10                02/13/2023                BUN                      14                  02/13/2023                NA                       135                 02/13/2023                K                        3.5                 02/13/2023                 Prostate CA    Musculoskeletal  (+) Arthritis ,    Abdominal   Peds  Hematology Lab Results      Component                Value               Date                      WBC                      7.2                 02/13/2023                HGB                      14.2                02/13/2023                HCT  42.7                02/13/2023                MCV                      86.6                02/13/2023                PLT                      408 (H)             02/13/2023              Anesthesia Other Findings   Reproductive/Obstetrics                             Anesthesia Physical Anesthesia Plan  ASA: 3  Anesthesia Plan: General   Post-op Pain Management: Lidocaine infusion*, Ketamine IV* and Ofirmev IV (intra-op)*   Induction: Intravenous  PONV Risk  Score and Plan: 2 and Treatment may vary due to age or medical condition, Midazolam, Ondansetron and Dexamethasone  Airway Management Planned: Oral ETT  Additional Equipment:   Intra-op Plan:   Post-operative Plan: Extubation in OR  Informed Consent: I have reviewed the patients History and Physical, chart, labs and discussed the procedure including the risks, benefits and alternatives for the proposed anesthesia with the patient or authorized representative who has indicated his/her understanding and acceptance.     Dental advisory given  Plan Discussed with: CRNA, Anesthesiologist and Surgeon  Anesthesia Plan Comments:         Anesthesia Quick Evaluation

## 2023-02-15 ENCOUNTER — Encounter (HOSPITAL_COMMUNITY)
Admission: RE | Disposition: A | Discharge status: Home / Self Care | Payer: Self-pay | Source: Home / Self Care | Attending: Urology

## 2023-02-15 ENCOUNTER — Inpatient Hospital Stay (HOSPITAL_COMMUNITY)
Admission: RE | Admit: 2023-02-15 | Discharge: 2023-02-18 | DRG: 708 | Disposition: A | Discharge status: Home / Self Care | Payer: No Typology Code available for payment source | Attending: Urology | Admitting: Urology

## 2023-02-15 ENCOUNTER — Ambulatory Visit (HOSPITAL_COMMUNITY): Payer: No Typology Code available for payment source | Admitting: Anesthesiology

## 2023-02-15 ENCOUNTER — Other Ambulatory Visit: Payer: Self-pay

## 2023-02-15 ENCOUNTER — Encounter (HOSPITAL_COMMUNITY): Payer: Self-pay | Admitting: Urology

## 2023-02-15 DIAGNOSIS — Z96612 Presence of left artificial shoulder joint: Secondary | ICD-10-CM | POA: Diagnosis present

## 2023-02-15 DIAGNOSIS — I10 Essential (primary) hypertension: Secondary | ICD-10-CM | POA: Diagnosis present

## 2023-02-15 DIAGNOSIS — Z96611 Presence of right artificial shoulder joint: Secondary | ICD-10-CM | POA: Diagnosis present

## 2023-02-15 DIAGNOSIS — E119 Type 2 diabetes mellitus without complications: Secondary | ICD-10-CM | POA: Diagnosis present

## 2023-02-15 DIAGNOSIS — C61 Malignant neoplasm of prostate: Secondary | ICD-10-CM

## 2023-02-15 DIAGNOSIS — F17210 Nicotine dependence, cigarettes, uncomplicated: Secondary | ICD-10-CM | POA: Diagnosis not present

## 2023-02-15 DIAGNOSIS — Z87448 Personal history of other diseases of urinary system: Secondary | ICD-10-CM

## 2023-02-15 DIAGNOSIS — Z79899 Other long term (current) drug therapy: Secondary | ICD-10-CM

## 2023-02-15 DIAGNOSIS — K4090 Unilateral inguinal hernia, without obstruction or gangrene, not specified as recurrent: Secondary | ICD-10-CM

## 2023-02-15 DIAGNOSIS — Z7984 Long term (current) use of oral hypoglycemic drugs: Secondary | ICD-10-CM

## 2023-02-15 DIAGNOSIS — K219 Gastro-esophageal reflux disease without esophagitis: Secondary | ICD-10-CM | POA: Diagnosis present

## 2023-02-15 HISTORY — PX: INGUINAL HERNIA REPAIR: SHX194

## 2023-02-15 HISTORY — PX: LYMPH NODE DISSECTION: SHX5087

## 2023-02-15 HISTORY — PX: ROBOT ASSISTED LAPAROSCOPIC RADICAL PROSTATECTOMY: SHX5141

## 2023-02-15 LAB — GLUCOSE, CAPILLARY
Glucose-Capillary: 104 mg/dL — ABNORMAL HIGH (ref 70–99)
Glucose-Capillary: 164 mg/dL — ABNORMAL HIGH (ref 70–99)
Glucose-Capillary: 146 mg/dL — ABNORMAL HIGH (ref 70–99)

## 2023-02-15 LAB — HEMOGLOBIN AND HEMATOCRIT, BLOOD
HCT: 39.4 % (ref 39.0–52.0)
Hemoglobin: 12.8 g/dL — ABNORMAL LOW (ref 13.0–17.0)

## 2023-02-15 SURGERY — PROSTATECTOMY, RADICAL, ROBOT-ASSISTED, LAPAROSCOPIC
Anesthesia: General | Site: Abdomen | Laterality: Right

## 2023-02-15 MED ORDER — ONDANSETRON HCL 4 MG/2ML IJ SOLN
4.0000 mg | INTRAMUSCULAR | Status: DC | PRN
  Administered 2023-02-15 – 2023-02-17 (×2): 4 mg via INTRAVENOUS
  Filled 2023-02-15 (×2): qty 2

## 2023-02-15 MED ORDER — DOCUSATE SODIUM 100 MG PO CAPS: 100.0000 mg | ORAL_CAPSULE | Freq: Two times a day (BID) | ORAL | Status: AC

## 2023-02-15 MED ORDER — MAGNESIUM CITRATE PO SOLN: 1.0000 | Freq: Once | ORAL | Status: DC

## 2023-02-15 MED ORDER — OXYCODONE HCL 5 MG PO TABS: 5.0000 mg | ORAL_TABLET | Freq: Once | ORAL | Status: DC | PRN

## 2023-02-15 MED ORDER — FENTANYL CITRATE (PF) 100 MCG/2ML IJ SOLN
INTRAMUSCULAR | Status: DC | PRN
  Administered 2023-02-15 (×5): 50 ug via INTRAVENOUS

## 2023-02-15 MED ORDER — ONDANSETRON HCL 4 MG/2ML IJ SOLN
INTRAMUSCULAR | Status: DC | PRN
  Administered 2023-02-15: 4 mg via INTRAVENOUS

## 2023-02-15 MED ORDER — MIDAZOLAM HCL 5 MG/5ML IJ SOLN
INTRAMUSCULAR | Status: DC | PRN
  Administered 2023-02-15: 2 mg via INTRAVENOUS

## 2023-02-15 MED ORDER — HYDROCODONE-ACETAMINOPHEN 5-325 MG PO TABS: 1.0000 | ORAL_TABLET | Freq: Four times a day (QID) | ORAL | 0 refills | Status: AC | PRN

## 2023-02-15 MED ORDER — SUGAMMADEX SODIUM 200 MG/2ML IV SOLN
INTRAVENOUS | Status: DC | PRN
  Administered 2023-02-15: 200 mg via INTRAVENOUS

## 2023-02-15 MED ORDER — OXYCODONE HCL 5 MG/5ML PO SOLN: 5.0000 mg | Freq: Once | ORAL | Status: DC | PRN

## 2023-02-15 MED ORDER — ORAL CARE MOUTH RINSE: 15.0000 mL | Freq: Once | OROMUCOSAL | Status: AC

## 2023-02-15 MED ORDER — LIDOCAINE HCL (PF) 2 % IJ SOLN
INTRAMUSCULAR | Status: DC | PRN
  Administered 2023-02-15: 1.5 mg/kg/h via INTRADERMAL

## 2023-02-15 MED ORDER — KETAMINE HCL 50 MG/5ML IJ SOSY
PREFILLED_SYRINGE | INTRAMUSCULAR | Status: AC
  Filled 2023-02-15: qty 5

## 2023-02-15 MED ORDER — TRIPLE ANTIBIOTIC 3.5-400-5000 EX OINT: 1.0000 | TOPICAL_OINTMENT | Freq: Three times a day (TID) | CUTANEOUS | Status: DC | PRN

## 2023-02-15 MED ORDER — CEFAZOLIN SODIUM-DEXTROSE 2-4 GM/100ML-% IV SOLN
2.0000 g | INTRAVENOUS | Status: AC
  Administered 2023-02-15: 2 g via INTRAVENOUS
  Filled 2023-02-15: qty 100

## 2023-02-15 MED ORDER — ONDANSETRON HCL 4 MG/2ML IJ SOLN
INTRAMUSCULAR | Status: AC
  Filled 2023-02-15: qty 2

## 2023-02-15 MED ORDER — PROPOFOL 10 MG/ML IV BOLUS
INTRAVENOUS | Status: AC
  Filled 2023-02-15: qty 20

## 2023-02-15 MED ORDER — CHLORTHALIDONE 25 MG PO TABS
25.0000 mg | ORAL_TABLET | Freq: Every day | ORAL | Status: DC
  Administered 2023-02-16 – 2023-02-18 (×3): 25 mg via ORAL
  Filled 2023-02-15 (×4): qty 1

## 2023-02-15 MED ORDER — LACTATED RINGERS IV SOLN: INTRAVENOUS | Status: DC | PRN

## 2023-02-15 MED ORDER — KETAMINE HCL 10 MG/ML IJ SOLN
INTRAMUSCULAR | Status: DC | PRN
  Administered 2023-02-15: 10 mg via INTRAVENOUS

## 2023-02-15 MED ORDER — BUPIVACAINE LIPOSOME 1.3 % IJ SUSP
INTRAMUSCULAR | Status: DC | PRN
  Administered 2023-02-15: 20 mL

## 2023-02-15 MED ORDER — SODIUM CHLORIDE 0.45 % IV SOLN: INTRAVENOUS | Status: DC

## 2023-02-15 MED ORDER — LIDOCAINE 2% (20 MG/ML) 5 ML SYRINGE
INTRAMUSCULAR | Status: DC | PRN
  Administered 2023-02-15: 60 mg via INTRAVENOUS

## 2023-02-15 MED ORDER — ONDANSETRON HCL 4 MG/2ML IJ SOLN: 4.0000 mg | Freq: Once | INTRAMUSCULAR | Status: DC | PRN

## 2023-02-15 MED ORDER — FENTANYL CITRATE (PF) 250 MCG/5ML IJ SOLN
INTRAMUSCULAR | Status: AC
  Filled 2023-02-15: qty 5

## 2023-02-15 MED ORDER — HYDROMORPHONE HCL 1 MG/ML IJ SOLN
INTRAMUSCULAR | Status: AC
  Filled 2023-02-15: qty 1

## 2023-02-15 MED ORDER — DIPHENHYDRAMINE HCL 12.5 MG/5ML PO ELIX
12.5000 mg | ORAL_SOLUTION | Freq: Four times a day (QID) | ORAL | Status: DC | PRN
  Administered 2023-02-18: 25 mg via ORAL
  Filled 2023-02-15: qty 10

## 2023-02-15 MED ORDER — SODIUM CHLORIDE (PF) 0.9 % IJ SOLN
INTRAMUSCULAR | Status: DC | PRN
  Administered 2023-02-15: 20 mL

## 2023-02-15 MED ORDER — HYDROMORPHONE HCL 1 MG/ML IJ SOLN
0.2500 mg | INTRAMUSCULAR | Status: DC | PRN
  Administered 2023-02-15 (×4): 0.5 mg via INTRAVENOUS

## 2023-02-15 MED ORDER — ROCURONIUM BROMIDE 10 MG/ML (PF) SYRINGE
PREFILLED_SYRINGE | INTRAVENOUS | Status: AC
  Filled 2023-02-15: qty 10

## 2023-02-15 MED ORDER — MIDAZOLAM HCL 2 MG/2ML IJ SOLN
INTRAMUSCULAR | Status: AC
  Filled 2023-02-15: qty 2

## 2023-02-15 MED ORDER — STERILE WATER FOR IRRIGATION IR SOLN
Status: DC | PRN
  Administered 2023-02-15: 1000 mL

## 2023-02-15 MED ORDER — INSULIN ASPART 100 UNIT/ML IJ SOLN
0.0000 [IU] | Freq: Three times a day (TID) | INTRAMUSCULAR | Status: DC
  Administered 2023-02-16 – 2023-02-18 (×5): 1 [IU] via SUBCUTANEOUS

## 2023-02-15 MED ORDER — PHENYLEPHRINE 80 MCG/ML (10ML) SYRINGE FOR IV PUSH (FOR BLOOD PRESSURE SUPPORT)
PREFILLED_SYRINGE | INTRAVENOUS | Status: DC | PRN
  Administered 2023-02-15: 80 ug via INTRAVENOUS
  Administered 2023-02-15: 160 ug via INTRAVENOUS
  Administered 2023-02-15: 80 ug via INTRAVENOUS

## 2023-02-15 MED ORDER — AMISULPRIDE (ANTIEMETIC) 5 MG/2ML IV SOLN: 10.0000 mg | Freq: Once | INTRAVENOUS | Status: DC | PRN

## 2023-02-15 MED ORDER — PROPOFOL 10 MG/ML IV BOLUS
INTRAVENOUS | Status: DC | PRN
  Administered 2023-02-15: 90 mg via INTRAVENOUS

## 2023-02-15 MED ORDER — SODIUM CHLORIDE (PF) 0.9 % IJ SOLN
INTRAMUSCULAR | Status: AC
  Filled 2023-02-15: qty 20

## 2023-02-15 MED ORDER — SODIUM CHLORIDE 0.9 % IV BOLUS
1000.0000 mL | Freq: Once | INTRAVENOUS | Status: AC
  Administered 2023-02-15: 1000 mL via INTRAVENOUS

## 2023-02-15 MED ORDER — CHLORHEXIDINE GLUCONATE 0.12 % MT SOLN
15.0000 mL | Freq: Once | OROMUCOSAL | Status: AC
  Administered 2023-02-15: 15 mL via OROMUCOSAL

## 2023-02-15 MED ORDER — DEXAMETHASONE SODIUM PHOSPHATE 4 MG/ML IJ SOLN
INTRAMUSCULAR | Status: DC | PRN
  Administered 2023-02-15: 8 mg via INTRAVENOUS

## 2023-02-15 MED ORDER — ACETAMINOPHEN 10 MG/ML IV SOLN
650.0000 mg | Freq: Once | INTRAVENOUS | Status: AC
  Administered 2023-02-15: 650 mg via INTRAVENOUS

## 2023-02-15 MED ORDER — ACETAMINOPHEN 500 MG PO TABS
1000.0000 mg | ORAL_TABLET | Freq: Four times a day (QID) | ORAL | Status: AC
  Administered 2023-02-15 – 2023-02-16 (×4): 1000 mg via ORAL
  Filled 2023-02-15 (×4): qty 2

## 2023-02-15 MED ORDER — OXYCODONE HCL 5 MG PO TABS: 5.0000 mg | ORAL_TABLET | ORAL | Status: DC | PRN

## 2023-02-15 MED ORDER — LACTATED RINGERS IV SOLN: INTRAVENOUS | Status: DC

## 2023-02-15 MED ORDER — LACTATED RINGERS IR SOLN
Status: DC | PRN
  Administered 2023-02-15: 1000 mL

## 2023-02-15 MED ORDER — DIPHENHYDRAMINE HCL 50 MG/ML IJ SOLN: 12.5000 mg | Freq: Four times a day (QID) | INTRAMUSCULAR | Status: DC | PRN

## 2023-02-15 MED ORDER — ROCURONIUM BROMIDE 10 MG/ML (PF) SYRINGE
PREFILLED_SYRINGE | INTRAVENOUS | Status: DC | PRN
  Administered 2023-02-15: 40 mg via INTRAVENOUS
  Administered 2023-02-15: 20 mg via INTRAVENOUS

## 2023-02-15 MED ORDER — ACETAMINOPHEN 10 MG/ML IV SOLN
INTRAVENOUS | Status: AC
  Filled 2023-02-15: qty 100

## 2023-02-15 MED ORDER — LIDOCAINE HCL (PF) 2 % IJ SOLN
INTRAMUSCULAR | Status: AC
  Filled 2023-02-15: qty 5

## 2023-02-15 MED ORDER — DEXAMETHASONE SODIUM PHOSPHATE 10 MG/ML IJ SOLN
INTRAMUSCULAR | Status: AC
  Filled 2023-02-15: qty 1

## 2023-02-15 MED ORDER — HYDROMORPHONE HCL 1 MG/ML IJ SOLN
0.5000 mg | INTRAMUSCULAR | Status: DC | PRN
  Administered 2023-02-15 (×2): 0.5 mg via INTRAVENOUS
  Filled 2023-02-15 (×2): qty 1

## 2023-02-15 MED ORDER — SULFAMETHOXAZOLE-TRIMETHOPRIM 800-160 MG PO TABS: 1.0000 | ORAL_TABLET | Freq: Two times a day (BID) | ORAL | 0 refills | Status: AC

## 2023-02-15 MED ORDER — DOCUSATE SODIUM 100 MG PO CAPS
100.0000 mg | ORAL_CAPSULE | Freq: Two times a day (BID) | ORAL | Status: DC
  Administered 2023-02-15 – 2023-02-18 (×6): 100 mg via ORAL
  Filled 2023-02-15 (×6): qty 1

## 2023-02-15 MED ORDER — HYOSCYAMINE SULFATE 0.125 MG SL SUBL: 0.1250 mg | SUBLINGUAL_TABLET | SUBLINGUAL | Status: DC | PRN

## 2023-02-15 MED ORDER — PANTOPRAZOLE SODIUM 40 MG PO TBEC
80.0000 mg | DELAYED_RELEASE_TABLET | Freq: Every day | ORAL | Status: DC
  Administered 2023-02-16 – 2023-02-18 (×3): 80 mg via ORAL
  Filled 2023-02-15 (×3): qty 2

## 2023-02-15 SURGICAL SUPPLY — 85 items
ADH SKN CLS APL DERMABOND .7 (GAUZE/BANDAGES/DRESSINGS) ×3
APL PRP STRL LF DISP 70% ISPRP (MISCELLANEOUS) ×3
APL SWBSTK 6 STRL LF DISP (MISCELLANEOUS) ×3
APPLICATOR COTTON TIP 6 STRL (MISCELLANEOUS) ×3 IMPLANT
APPLICATOR COTTON TIP 6IN STRL (MISCELLANEOUS) ×3
BAG COUNTER SPONGE SURGICOUNT (BAG) IMPLANT
BAG SPNG CNTER NS LX DISP (BAG)
CATH FOLEY 2WAY SLVR 18FR 30CC (CATHETERS) ×3 IMPLANT
CATH TIEMANN FOLEY 18FR 5CC (CATHETERS) ×3 IMPLANT
CHLORAPREP W/TINT 26 (MISCELLANEOUS) ×3 IMPLANT
CLIP LIGATING HEM O LOK PURPLE (MISCELLANEOUS) ×12 IMPLANT
CNTNR URN SCR LID CUP LEK RST (MISCELLANEOUS) ×3 IMPLANT
CONT SPEC 4OZ STRL OR WHT (MISCELLANEOUS) ×3
COVER SURGICAL LIGHT HANDLE (MISCELLANEOUS) ×3 IMPLANT
COVER TIP SHEARS 8 DVNC (MISCELLANEOUS) ×3 IMPLANT
COVER TIP SHEARS 8MM DA VINCI (MISCELLANEOUS) ×3
CUTTER ECHEON FLEX ENDO 45 340 (ENDOMECHANICALS) ×3 IMPLANT
DERMABOND ADVANCED .7 DNX12 (GAUZE/BANDAGES/DRESSINGS) ×3 IMPLANT
DRAIN CHANNEL RND F F (WOUND CARE) IMPLANT
DRAPE ARM DVNC X/XI (DISPOSABLE) ×12 IMPLANT
DRAPE COLUMN DVNC XI (DISPOSABLE) ×3 IMPLANT
DRAPE DA VINCI XI ARM (DISPOSABLE) ×12
DRAPE DA VINCI XI COLUMN (DISPOSABLE) ×3
DRAPE SURG IRRIG POUCH 19X23 (DRAPES) ×3 IMPLANT
DRIVER NDL LRG 8 DVNC XI (INSTRUMENTS) ×6 IMPLANT
DRIVER NDLE LRG 8 DVNC XI (INSTRUMENTS) ×6 IMPLANT
DRIVER NEEDLE XI 8MM LRG DVNC (INSTRUMENTS) ×6
DRSG TEGADERM 4X4.75 (GAUZE/BANDAGES/DRESSINGS) ×3 IMPLANT
ELECT PENCIL ROCKER SW 15FT (MISCELLANEOUS) ×3 IMPLANT
ELECT REM PT RETURN 15FT ADLT (MISCELLANEOUS) ×3 IMPLANT
FORCEPS BIPOLAR XI LONG (INSTRUMENTS) ×3
FORCEPS BPLR LNG DVNC XI (INSTRUMENTS) ×3 IMPLANT
FORCEPS PROGRASP DVNC XI (FORCEP) ×3 IMPLANT
FORCEPS XI PROGRASP DA VINCI (FORCEP) ×3
GAUZE 4X4 16PLY ~~LOC~~+RFID DBL (SPONGE) IMPLANT
GAUZE SPONGE 2X2 8PLY STRL LF (GAUZE/BANDAGES/DRESSINGS) IMPLANT
GAUZE SPONGE 4X4 12PLY STRL (GAUZE/BANDAGES/DRESSINGS) ×3 IMPLANT
GLOVE BIO SURGEON STRL SZ 6.5 (GLOVE) ×3 IMPLANT
GLOVE BIOGEL PI IND STRL 7.5 (GLOVE) ×3 IMPLANT
GLOVE SURG LX STRL 7.5 STRW (GLOVE) ×6 IMPLANT
GOWN SRG XL LVL 4 BRTHBL STRL (GOWNS) ×3 IMPLANT
GOWN STRL NON-REIN XL LVL4 (GOWNS) ×3
GOWN STRL REUS W/ TWL XL LVL3 (GOWN DISPOSABLE) ×6 IMPLANT
GOWN STRL REUS W/TWL XL LVL3 (GOWN DISPOSABLE) ×6
HOLDER FOLEY CATH W/STRAP (MISCELLANEOUS) ×3 IMPLANT
IRRIG SUCT STRYKERFLOW 2 WTIP (MISCELLANEOUS) ×3
IRRIGATION SUCT STRKRFLW 2 WTP (MISCELLANEOUS) ×3 IMPLANT
IV LACTATED RINGERS 1000ML (IV SOLUTION) ×3 IMPLANT
KIT PROCEDURE DA VINCI SI (MISCELLANEOUS) ×3
KIT PROCEDURE DVNC SI (MISCELLANEOUS) ×3 IMPLANT
KIT TURNOVER KIT A (KITS) IMPLANT
NDL INSUFFLATION 14GA 120MM (NEEDLE) ×3 IMPLANT
NDL SPNL 22GX7 QUINCKE BK (NEEDLE) ×3 IMPLANT
NEEDLE INSUFFLATION 14GA 120MM (NEEDLE) ×3 IMPLANT
NEEDLE SPNL 22GX7 QUINCKE BK (NEEDLE) ×3 IMPLANT
PACK ROBOT UROLOGY CUSTOM (CUSTOM PROCEDURE TRAY) ×3 IMPLANT
PAD POSITIONING PINK XL (MISCELLANEOUS) ×3 IMPLANT
PLUG CATH AND CAP STER (CATHETERS) IMPLANT
PORT ACCESS TROCAR AIRSEAL 12 (TROCAR) ×3 IMPLANT
RELOAD STAPLE 45 4.1 GRN THCK (STAPLE) ×3 IMPLANT
SCISSORS MNPLR CVD DVNC XI (INSTRUMENTS) ×3 IMPLANT
SCISSORS XI MNPLR CVD DVNC (INSTRUMENTS) ×3
SEAL UNIV 5-12 XI (MISCELLANEOUS) ×9 IMPLANT
SEAL XI UNIVERSAL 5-12 (MISCELLANEOUS) ×9
SET CYSTO W/LG BORE CLAMP LF (SET/KITS/TRAYS/PACK) IMPLANT
SET TRI-LUMEN FLTR TB AIRSEAL (TUBING) ×3 IMPLANT
SOL ELECTROSURG ANTI STICK (MISCELLANEOUS) ×3
SOLUTION ELECTROSURG ANTI STCK (MISCELLANEOUS) ×3 IMPLANT
SPIKE FLUID TRANSFER (MISCELLANEOUS) ×3 IMPLANT
SPONGE T-LAP 4X18 ~~LOC~~+RFID (SPONGE) ×3 IMPLANT
STAPLE RELOAD 45 GRN (STAPLE) ×3 IMPLANT
STAPLE RELOAD 45MM GREEN (STAPLE) ×3
SUT ETHIBOND CT1 BRD #0 30IN (SUTURE) IMPLANT
SUT ETHILON 3 0 PS 1 (SUTURE) ×3 IMPLANT
SUT MNCRL AB 4-0 PS2 18 (SUTURE) ×6 IMPLANT
SUT PDS AB 1 CT1 27 (SUTURE) ×6 IMPLANT
SUT VIC AB 2-0 SH 27 (SUTURE) ×3
SUT VIC AB 2-0 SH 27X BRD (SUTURE) ×3 IMPLANT
SUT VICRYL 0 UR6 27IN ABS (SUTURE) ×3 IMPLANT
SUT VLOC BARB 180 ABS3/0GR12 (SUTURE) ×9
SUTURE VLOC BRB 180 ABS3/0GR12 (SUTURE) ×9 IMPLANT
SYR 27GX1/2 1ML LL SAFETY (SYRINGE) ×3 IMPLANT
TOWEL OR NON WOVEN STRL DISP B (DISPOSABLE) ×3 IMPLANT
TROCAR Z-THREAD FIOS 5X100MM (TROCAR) IMPLANT
WATER STERILE IRR 1000ML POUR (IV SOLUTION) ×3 IMPLANT

## 2023-02-15 NOTE — Transfer of Care (Signed)
Immediate Anesthesia Transfer of Care Note  Patient: Stephen [name]  Procedure(s) Performed: XI ROBOTIC ASSISTED LAPAROSCOPIC RADICAL PROSTATECTOMY WITH INDOCYANINEGREEN DYE INJECTION (Abdomen) PELVIC LYMPH NODE DISSECTION (Bilateral: Abdomen) LAPAROSCOPIC INGUINAL HERNIA (Right: Abdomen)  Patient Location: PACU  Anesthesia Type:General  Level of Consciousness: awake and patient cooperative  Airway & Oxygen Therapy: Patient Spontanous Breathing and Patient connected to face mask  Post-op Assessment: Report given to RN and Post -op Vital signs reviewed and stable  Post vital signs: Reviewed and stable  Last Vitals:  Vitals Value Taken Time  BP 155/87 02/15/23 1507  Temp    Pulse 76 02/15/23 1508  Resp 11 02/15/23 1508  SpO2 100 % 02/15/23 1508  Vitals shown include unvalidated device data.  Last Pain:  Vitals:   02/15/23 1023  TempSrc:   PainSc: 0-No pain         Complications: No notable events documented.

## 2023-02-15 NOTE — Anesthesia Postprocedure Evaluation (Signed)
Anesthesia Post Note  Patient: Fredonia Highland  Procedure(s) Performed: XI ROBOTIC ASSISTED LAPAROSCOPIC RADICAL PROSTATECTOMY WITH INDOCYANINEGREEN DYE INJECTION (Abdomen) PELVIC LYMPH NODE DISSECTION (Bilateral: Abdomen) LAPAROSCOPIC INGUINAL HERNIA (Right: Abdomen)     Patient location during evaluation: PACU Anesthesia Type: General Level of consciousness: awake and alert Pain management: pain level controlled Vital Signs Assessment: post-procedure vital signs reviewed and stable Respiratory status: spontaneous breathing, nonlabored ventilation, respiratory function stable and patient connected to nasal cannula oxygen Cardiovascular status: blood pressure returned to baseline and stable Postop Assessment: no apparent nausea or vomiting Anesthetic complications: no  No notable events documented.  Last Vitals:  Vitals:   02/15/23 1600 02/15/23 1619  BP: (!) 154/77 (!) 150/80  Pulse: 83 94  Resp: 12 18  Temp: 36.4 C 36.5 C  SpO2: 98% 100%    Last Pain:  Vitals:   02/15/23 1619  TempSrc: Oral  PainSc:                  Trevor Iha

## 2023-02-15 NOTE — H&P (Signed)
Stephen [name] is an 62 y.o. male.    Chief Complaint: Pre-Op Prostatectomy + node dissection + Rt inguinal hernia repair  HPI:   1 - Moderate Risk Prostate Cancer - 12/15 cores up to 50% Grade 1 and 2 cancer by BX 2023 at [facility] by Dr. Garald Braver on eval PSA 8.7. TRUS 60mL. Had productive meeting with rad-onc and especially discussed brachy. Minimal baseline LUTS.   2 - Rt inguinal Hernia - abtou 2cm Rt inguinal hernia on exam 11/2022. No h/o strangulation, reducible, at rst protrudes between rings.   PMH sig for DM2 (A1c 6-7), GERD, bilateral shoulder replacement (arthritis). No CV disese / blood thinners. 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 Lives alone in [place], his brother Stephen [name] lives near and is involved. His PCP is at [facility].   Today "Stephen [name]" is seen  to proceed with prostatectomy + node dissection and Rt inguinal hernia repari for large volume but low-moderate grade prostate cancer.    Past Medical History:  Diagnosis Date   Arthritis    Cancer    Chronic kidney disease    GERD (gastroesophageal reflux disease)    Pancreatitis    Pancreatitis    10-2022   Tuberculosis    yrs. ago pt. states he was dx. here at [facility]    Past Surgical History:  Procedure Laterality Date   FRACTURE SURGERY     jaw   REVERSE SHOULDER ARTHROPLASTY Left 12/11/2018   Procedure: LEFT REVERSE SHOULDER ARTHROPLASTY;  Surgeon: Cammy Copa, MD;  Location: [facility] OR;  Service: Orthopedics;  Laterality: Left;   REVERSE SHOULDER ARTHROPLASTY Right 09/03/2019   REVERSE SHOULDER ARTHROPLASTY Right 09/03/2019   Procedure: RIGHT REVERSE SHOULDER ARTHROPLASTY;  Surgeon: Cammy Copa, MD;  Location: [facility] OR;  Service: Orthopedics;  Laterality: Right;    History reviewed. No pertinent family history. Social History:  reports that he has been smoking cigarettes. He has never used smokeless tobacco. He reports current alcohol use. He reports current drug use. Drug: Marijuana.  Allergies:  No Known Allergies  Medications Prior to Admission  Medication Sig Dispense Refill   diclofenac Sodium (VOLTAREN) 1 % GEL Apply 4 g topically 4 (four) times daily. 100 g 3   dicyclomine (BENTYL) 20 MG tablet Take 1 tablet (20 mg total) by mouth 2 (two) times daily as needed for spasms. 20 tablet 0   Potassium Chloride 10 MEQ PACK TAKE ONE TABLET BY MOUTH DAILY FOR LOW POTASSIUM (TAKE WITH FOOD)     chlorthalidone (HYGROTON) 25 MG tablet Take 1 tablet (25 mg total) by mouth daily. 90 tablet 3   lipase/protease/amylase (CREON) 36000 UNITS CPEP capsule TAKE 2 CAPSULES BY MOUTH THREE TIMES A DAY WITH MEALS AND ONE CAPSULE WITH SNACKS     metFORMIN (GLUCOPHAGE) 500 MG tablet TAKE ONE TABLET BY MOUTH DAILY FOR DIABETES (ANNUAL KIDNEY FUNCTION TESTING IS NEEDED)     methocarbamol (ROBAXIN) 500 MG tablet TAKE 1 TABLET BY MOUTH EVERY 8 HOURS AS NEEDED FOR MUSCLE SPASMS. 30 tablet 0   omeprazole (PRILOSEC) 40 MG capsule Take 1 capsule by mouth daily.     ondansetron (ZOFRAN ODT) 4 MG disintegrating tablet Take 1 tablet (4 mg total) by mouth every 8 (eight) hours as needed for nausea or vomiting. 6 tablet 0   ondansetron (ZOFRAN) 4 MG tablet Take 1 tablet (4 mg total) by mouth every 6 (six) hours. 12 tablet 0   pantoprazole (PROTONIX) 40 MG tablet Take 1 tablet (40 mg total) by  mouth daily. 15 tablet 0   promethazine (PHENERGAN) 25 MG suppository Place 1 suppository (25 mg total) rectally every 6 (six) hours as needed for nausea or vomiting. 12 each 0   rosuvastatin (CRESTOR) 40 MG tablet TAKE ONE TABLET BY MOUTH AT BEDTIME FOR CHOLESTEROL     sildenafil (VIAGRA) 100 MG tablet TAKE ONE-HALF TABLET BY MOUTH AS DIRECTED FOR ERECTILE DYSFUNCTION (TAKE 1 HOUR PRIOR TO SEXUAL ACTIVITY *DO NOT EXCEED 1 DOSE PER 24 HOUR PERIOD*)     thiamine 100 MG tablet Take 1 tablet (100 mg total) by mouth daily. 90 tablet 3    Results for orders placed or performed during the hospital encounter of 02/15/23 (from the past 48  hour(s))  Glucose, capillary     Status: Abnormal   Collection Time: 02/15/23 10:07 AM  Result Value Ref Range   Glucose-Capillary 104 (H) 70 - 99 mg/dL    Comment: Glucose reference range applies only to samples taken after fasting for at least 8 hours.   No results found.  Review of Systems  Constitutional:  Negative for chills and fever.  All other systems reviewed and are negative.   Blood pressure 122/76, pulse 76, temperature 98.2 F (36.8 C), temperature source Oral, resp. rate 16, height 5\' 7"  (1.702 m), weight 49.4 kg, SpO2 99 %. Physical Exam Vitals reviewed.  Eyes:     Pupils: Pupils are equal, round, and reactive to light.  Cardiovascular:     Rate and Rhythm: Normal rate.  Pulmonary:     Effort: Pulmonary effort is normal.  Abdominal:     General: Abdomen is flat.  Genitourinary:    Comments: No CVAT at present Musculoskeletal:        General: Normal range of motion.     Cervical back: Normal range of motion.  Skin:    General: Skin is warm.  Neurological:     Mental Status: He is alert.  Psychiatric:        Mood and Affect: Mood normal.      Assessment/Plan Proceed as planned with prostatectomy / node dissect, Rt inguinal hernia repair. Risks, benefits, alternatives, expected peri-op course discussed previously and reiterated today.   Sebastian Ache, MD 02/15/2023, 11:24 AM

## 2023-02-15 NOTE — Anesthesia Procedure Notes (Signed)
Procedure Name: Intubation Date/Time: 02/15/2023 12:34 PM  Performed by: Ludwig Lean, CRNAPre-anesthesia Checklist: Patient identified, Emergency Drugs available, Suction available and Patient being monitored Patient Re-evaluated:Patient Re-evaluated prior to induction Oxygen Delivery Method: Circle system utilized Preoxygenation: Pre-oxygenation with 100% oxygen Induction Type: IV induction Ventilation: Mask ventilation without difficulty Laryngoscope Size: Mac and 4 Grade View: Grade I Tube type: Oral Tube size: 7.5 mm Number of attempts: 1 Airway Equipment and Method: Stylet Placement Confirmation: ETT inserted through vocal cords under direct vision, positive ETCO2 and breath sounds checked- equal and bilateral Secured at: 23 cm Tube secured with: Tape Dental Injury: Teeth and Oropharynx as per pre-operative assessment

## 2023-02-15 NOTE — Progress Notes (Signed)
Pt unable to take po medications at this time, due to throwing up. Zofran given.

## 2023-02-15 NOTE — Op Note (Signed)
NAME: Stephen Stephen [name], Stephen Stephen [name]. MEDICAL RECORD NO: [id number] ACCOUNT NO: [id number] DATE OF BIRTH: 11-27-1960 FACILITY: [facility] LOCATION: [id number] PHYSICIAN: Stephen Ache, MD  Operative Report   DATE OF PROCEDURE: 02/15/2023  PREOPERATIVE DIAGNOSIS:  Large volume moderate risk prostate cancer.  PROCEDURES:   1.  Robotic-assisted laparoscopic radical prostatectomy. 2.  Pelvic lymph node dissection. 3.  Right laparoscopic inguinal hernia repair.  POSTOPERATIVE DIAGNOSIS:  Prostate cancer plus right inguinal hernia.  ESTIMATED BLOOD LOSS:  50 mL.  COMPLICATIONS:  None.  SPECIMEN:   1.  Right external iliac lymph nodes. 2.  Right obturator lymph nodes. 3.  Left external iliac lymph nodes. 4.  Left obturator lymph nodes. 5.  Prostatectomy.  FINDINGS:   1.  Indirect right inguinal hernia. 2.  Resolution of hernia defect following simple closure. 3.  No evidence of sentinel lymph nodes within the pelvis despite excellent parenchymal uptake with ICG lymphangiography.  INDICATIONS:  The patient is Stephen [name] pleasant 62 year old man who was found on workup of elevated PSA to have multifocal fairly large volume adenocarcinoma of the prostate.  Options were discussed for management including surveillance protocols versus ablative  therapy versus surgical extirpation and he has had multiple opinions and counseled very well with multiple GU and Radiation Oncology providers and has elected to undergo radical prostatectomy.  Staging imaging did reveal Stephen [name] right inguinal hernia.  He  does not have any history of strangulation. We discussed pre=emptive repair to prevent future complications and wished to proceed with this as well.  Informed consent was obtained and placed in medical record.  PROCEDURE IN DETAIL:  The patient being identified and verified, procedure being radical prostatectomy with node dissection plus right inguinal hernia repair was confirmed.  Procedure timeout was performed.  Intravenous  antibiotics were administered.   General endotracheal anesthesia induced.  The patient was placed into Stephen [name] low lithotomy position.  Sterile field was created, prepped and draped the patient's penis, perineum, and proximal thighs using iodine and his infra-xiphoid abdomen using  chlorhexidine gluconate after he was further fastened to operating table using 3-inch tape over foam padding across the supraxiphoid chest.  His arms were tucked to the side using gel rolls.  Stephen [name] test of steep Trendelenburg positioning was performed.   Foley catheter was placed per urethra to straight drain and Stephen [name] high flow, low pressure, pneumoperitoneum was obtained using Veress technique in the supraumbilical midline having passed the aspiration and drop test.  An 8 mm robotic camera port was then  carefully placed in same location.  Exquisite care was taken given his very thin and small stature.  Laparoscopic examination of peritoneal cavity showed no significant adhesions, no visceral injury.  There was immediately noted to be an indirect fascial  defect consistent with Stephen [name] right inguinal hernia.  There was no obvious enteric contents at this point.  Additional ports were placed as follows:  Right paramedian 8 mm robotic port, right far lateral 8 mm robotic port, left paramedian 8 mm robotic port,  left far lateral 12 mm AirSeal assist port, right paramedian 5 mm suction port.  Robot was docked and passed the electronic checks.  Initial attention was directed at development of space of Retzius.  Incision was made lateral to the right medial  umbilical ligament from the midline towards the area of the internal ring coursing along the iliac vessels towards the right ureter was positively identified.  Right vas deferens was encountered, ligated using medial bucket handle as the lateral bladder  was swept  away the pelvic sidewall towards the endopelvic fascia on the right side.  Stephen [name] mirror image dissection was performed on the left side.   Anterior attachments were taken down with cautery scissors to expose the anterior base of the prostate, which  was defatted to better denote the bladder and prostate neck junction.  Next, 0.2 mL of indocyanine green dye was injected in each lobe of the prostate using Stephen [name] percutaneously placed robotically guided spinal needle with intervening suctioning to prevent  dye spillage, which did not occur.  Next, the endopelvic fascia was swept away from the lateral aspect of the prostate in base to apex orientation.  This exposed the dorsal venous complex was carefully controlled using green load stapler, taking  exquisite care to avoid membranous urethral injury, which did not occur.  Then, approximately 10 minutes post-dye injection, the pelvis was inspected under near infrared fluorescence light.  Sentinel lymphangiography revealed excellent parenchymal uptake of the prostate with multiple lymphatic channels seen coursing towards the area of lymphatic field and the pelvis, however, there were no obvious sentinel lymph nodes within the pelvis with  careful inspection as such, standard template adenectomy was performed.  First, the right external iliac group with boundaries being right external iliac artery, vein, pelvic sidewall, iliac bifurcation.  Lymphostasis achieved with cold clips set aside  labeled as right external lymph nodes.  Next, right obturator group was dissected free with the boundaries being right external iliac vein, pelvic sidewall, obturator nerve.  Lymphostasis was achieved with cold clips set aside labeled as right obturator  lymph nodes.  Stephen [name] mirror image dissection was performed on the left side, left external iliac and left obturator group respectively and left obturator nerve was inspected following maneuvers and found to be uninjured.  Attention was then directed at  bladder neck dissection, bladder neck was released from the lateral aspect of the prostate following the curve of the  prostate towards the area of the midline, which better denote the true bladder neck caliber.  The bladder neck was separated from the  prostate in anterior and posterior direction, keeping what appeared to be Stephen [name] rim of circular muscle fibers each plane of dissection.  I was quite happy with complete bladder neck preservation.  Posterior dissection was performed by incising approximately  7 mm inferior posterior lip of the prostate entering the plane of Denonvilliers.  Bilateral vas deferens were encountered, dissected for Stephen [name] distance of approximately 4 cm, ligated and placed on gentle superior traction.  Bilateral seminal vesicles were  dissected to their tip and placed on gentle superior traction.  This exposed the vascular pedicles on each side, which was controlled using Stephen [name] sequential clipping technique in Stephen [name] base to apex orientation with mild to moderate nerve sparing performed at the  area of the apex as the planes does appear clean.  Final apical dissection was performed in the anterior plane placing the prostate on gentle superior traction transecting the membranous urethra coldly.  This completely freed up the prostatectomy  specimen, which was placed in EndoCatch bag for later retrieval.  Next, digital rectal exam was performed using indicator glove and laparoscopic vision.  No evidence of rectal violation was noted.  Posterior dissection was performed using 3-0 V-Loc  suture, reapproximating the posterior urethral plate, the posterior bladder neck, bringing the structures into tension-free apposition.  Mucosa-to-mucosa anastomosis was performed using double-arm 3-0 V-Loc suture from the 6 o'clock, 12 o'clock position,  which revealed an excellent tension-free apposition.  Stephen [name] new Foley  catheter was then placed, which irrigated quantitatively.  Sponge and needle counts were correct.  Hemostasis was excellent.  We achieved the goals of extirpated portion of the surgery  today.  We then directed attention  to repair the right inguinal hernia.  Again, there was approximately 2 to 2-1/2 cm fascial defect in an indirect type fashion.  This is closed using figure-of-eight Ethibond x2 with the lateral reapproximating the true  fascia and the medial reapproximating the fascia to the pectinate line of the pubic ramus.  This resulted in complete resolution of the hernia defect.  Stephen [name] closed suction drain was brought out the previous left lateral most robotic port site.  The area of  the peritoneal cavity.  Robot was then undocked.  Specimen was retrieved by extending the previous camera port aside for Stephen [name] total distance of approximately 3 cm, removing the relatively small prostate as Stephen [name] specimen and set aside for permanent pathology.   Extraction site was closed at the level of fascia using figure-of-eight PDS x3 followed by reapproximation of Scarpa's with running Vicryl.  All incision sites were infiltrated with dilute lipolyzed Marcaine and closed at the level of skin using  subcuticular Monocryl for Dermabond.  Procedure was then terminated.  The patient tolerated procedure well, no immediate perioperative complications.  The patient was taken to postanesthesia care in stable condition.  Plan for observation admission.  Please note, first assistant, Harrie Foreman, was crucial for all portions of the surgery today.  She provided invaluable retraction, suctioning, robotic instrument exchange, vascular clipping, vascular stapling and general first assistance.   PUS D: 02/15/2023 3:07:21 pm T: 02/15/2023 5:37:00 pm  JOB: [id number]/ [id number]

## 2023-02-15 NOTE — Brief Op Note (Signed)
02/15/2023  2:57 PM  PATIENT:  Stephen [name]  62 y.o. male  PRE-OPERATIVE DIAGNOSIS:  PROSTATE CANCER, INGUINAL HERNIA  POST-OPERATIVE DIAGNOSIS:  PROSTATE CANCER, INGUINAL HERNIA  PROCEDURE:  Procedure(s): XI ROBOTIC ASSISTED LAPAROSCOPIC RADICAL PROSTATECTOMY WITH INDOCYANINEGREEN DYE INJECTION (N/A) PELVIC LYMPH NODE DISSECTION (Bilateral) LAPAROSCOPIC INGUINAL HERNIA (Right)  SURGEON:  Surgeon(s) and Role:    Sebastian Ache, MD - Primary  PHYSICIAN ASSISTANT:   ASSISTANTS: Harrie Foreman PA   ANESTHESIA:   local and general  EBL:  100 mL   BLOOD ADMINISTERED:none  DRAINS:  1 - JP to bulb; 2 - Foley to gravity    LOCAL MEDICATIONS USED:  MARCAINE     SPECIMEN:  Source of Specimen:  1 - pelvic lymph nodes; 2 - prostatectomy  DISPOSITION OF SPECIMEN:  PATHOLOGY  COUNTS:  YES  TOURNIQUET:  * No tourniquets in log *  DICTATION: .Other Dictation: Dictation Number [id number]  PLAN OF CARE: Admit for overnight observation  PATIENT DISPOSITION:  PACU - hemodynamically stable.   Delay start of Pharmacological VTE agent (>24hrs) due to surgical blood loss or risk of bleeding: yes

## 2023-02-15 NOTE — Progress Notes (Signed)
Pt very irritable, unpleasant and contentious during preoperative phase. Refuses to answer questions appropriately. Unable to completely obtain home medications history. Pt educated on the importance of this process. Pt continues to refuse to cooperate and is very aggressive when asked the preoperative questions.

## 2023-02-15 NOTE — Discharge Instructions (Signed)

## 2023-02-16 ENCOUNTER — Encounter (HOSPITAL_COMMUNITY): Payer: Self-pay | Admitting: Urology

## 2023-02-16 LAB — BASIC METABOLIC PANEL
Anion gap: 8 (ref 5–15)
BUN: 7 mg/dL — ABNORMAL LOW (ref 8–23)
CO2: 31 mmol/L (ref 22–32)
Calcium: 8.9 mg/dL (ref 8.9–10.3)
Chloride: 96 mmol/L — ABNORMAL LOW (ref 98–111)
Creatinine, Ser: 0.96 mg/dL (ref 0.61–1.24)
GFR, Estimated: >60 mL/min (ref >60)
Glucose, Bld: 122 mg/dL — ABNORMAL HIGH (ref 70–99)
Potassium: 3.1 mmol/L — ABNORMAL LOW (ref 3.5–5.1)
Sodium: 135 mmol/L (ref 135–145)

## 2023-02-16 LAB — HEMOGLOBIN AND HEMATOCRIT, BLOOD
HCT: 40.8 % (ref 39.0–52.0)
Hemoglobin: 13 g/dL (ref 13.0–17.0)

## 2023-02-16 LAB — GLUCOSE, CAPILLARY
Glucose-Capillary: 104 mg/dL — ABNORMAL HIGH (ref 70–99)
Glucose-Capillary: 128 mg/dL — ABNORMAL HIGH (ref 70–99)
Glucose-Capillary: 111 mg/dL — ABNORMAL HIGH (ref 70–99)
Glucose-Capillary: 138 mg/dL — ABNORMAL HIGH (ref 70–99)

## 2023-02-16 MED ORDER — CHLORHEXIDINE GLUCONATE CLOTH 2 % EX PADS
6.0000 | MEDICATED_PAD | Freq: Every day | CUTANEOUS | Status: DC
  Administered 2023-02-16 – 2023-02-18 (×2): 6 via TOPICAL

## 2023-02-16 MED ORDER — OXYCODONE HCL 5 MG PO TABS
10.0000 mg | ORAL_TABLET | ORAL | Status: DC | PRN
  Administered 2023-02-16 – 2023-02-17 (×4): 10 mg via ORAL
  Filled 2023-02-16 (×3): qty 2

## 2023-02-16 MED ORDER — OXYCODONE HCL 5 MG PO TABS
5.0000 mg | ORAL_TABLET | ORAL | Status: DC | PRN
  Administered 2023-02-16: 5 mg via ORAL
  Filled 2023-02-16 (×3): qty 1

## 2023-02-16 MED ORDER — HYDROMORPHONE HCL 1 MG/ML IJ SOLN
0.5000 mg | INTRAMUSCULAR | Status: DC | PRN
  Administered 2023-02-16 – 2023-02-17 (×3): 0.5 mg via INTRAVENOUS
  Filled 2023-02-16 (×3): qty 0.5

## 2023-02-16 NOTE — Progress Notes (Signed)
Urology Progress Note   1 Day Post-Op s/p RALP, Bilateral PLND, Right Inguinal Hernia Repair on 02/15/2023  Subjective: No acute events overnight. Afebrile and stable. Pain well controlled with PRNs. Making adequate urine via foley catheter. Ambulated several times. Tolerating clears. 100cc from JP drain. Hgb 13, BMP in process  Objective: Vital signs in last 24 hours: Temp:  [97.6 F (36.4 C)-98.2 F (36.8 C)] 98 F (36.7 C) (04/11 0404) Pulse Rate:  [73-94] 74 (04/11 0404) Resp:  [10-20] 20 (04/11 0404) BP: (116-156)/(76-96) 131/85 (04/11 0404) SpO2:  [98 %-100 %] 100 % (04/11 0404) Weight:  [49.4 kg] 49.4 kg (04/10 1023)  Intake/Output from previous day: 04/10 0701 - 04/11 0700 In: 4228 [I.V.:2000; IV Piggyback:2228] Out: 2200 [Urine:2000; Drains:100; Blood:100] Intake/Output this shift: No intake/output data recorded.  Physical Exam:  General: Alert and oriented CV: Regular rate Lungs: Normal work of breathing on room air Abdomen: Soft, appropriately tender, nondistended. Incisions c/d/i GU: Foley in place draining clear yellow urine  Ext: NT, No erythema  Lab Results: Recent Labs    02/13/23 0945 02/15/23 1536 02/16/23 0532  HGB 14.2 12.8* 13.0  HCT 42.7 39.4 40.8   BMET Recent Labs    02/13/23 0945  NA 135  K 3.5  CL 94*  CO2 29  GLUCOSE 121*  BUN 14  CREATININE 1.10  CALCIUM 9.6     Studies/Results: No results found.  Assessment/Plan:  62 y.o. male s/p RALP, Bilateral PLND, Right Inguinal Hernia Repair on 02/15/2023  - Scheduled Tylenol, PRN oxycodone and PRN dilaudid - Ambulate, OOB, IS  - Advanced to regular diet - Medlock - Continue foley catheter s/p urologic procedure. TOV outpatient at [facility] - JP out day of discharge - Surgical pathology in process   Dispo: Floor status   LOS: 0 days   Stephen [name] Stephen [name] 02/16/2023, 7:08 AM

## 2023-02-16 NOTE — Progress Notes (Signed)
  Transition of Care [facility]) Screening Note   Patient Details  Name: TRACE HANRAHAN Date of Birth: 1961-09-26   Transition of [facility]) CM/SW Contact:    Lanier Clam, RN Phone Number: 02/16/2023, 12:32 PM    Transition of Care Department [facility]) has reviewed patient and no TOC needs have been identified at this time. We will continue to monitor patient advancement through interdisciplinary progression rounds. If new patient transition needs arise, please place a TOC consult.

## 2023-02-17 DIAGNOSIS — Z87448 Personal history of other diseases of urinary system: Secondary | ICD-10-CM | POA: Diagnosis not present

## 2023-02-17 DIAGNOSIS — Z79899 Other long term (current) drug therapy: Secondary | ICD-10-CM | POA: Diagnosis not present

## 2023-02-17 DIAGNOSIS — Z96612 Presence of left artificial shoulder joint: Secondary | ICD-10-CM | POA: Diagnosis present

## 2023-02-17 DIAGNOSIS — K4090 Unilateral inguinal hernia, without obstruction or gangrene, not specified as recurrent: Secondary | ICD-10-CM | POA: Diagnosis present

## 2023-02-17 DIAGNOSIS — I10 Essential (primary) hypertension: Secondary | ICD-10-CM | POA: Diagnosis present

## 2023-02-17 DIAGNOSIS — K219 Gastro-esophageal reflux disease without esophagitis: Secondary | ICD-10-CM | POA: Diagnosis present

## 2023-02-17 DIAGNOSIS — F17210 Nicotine dependence, cigarettes, uncomplicated: Secondary | ICD-10-CM | POA: Diagnosis present

## 2023-02-17 DIAGNOSIS — Z7984 Long term (current) use of oral hypoglycemic drugs: Secondary | ICD-10-CM | POA: Diagnosis not present

## 2023-02-17 DIAGNOSIS — C61 Malignant neoplasm of prostate: Secondary | ICD-10-CM | POA: Diagnosis present

## 2023-02-17 DIAGNOSIS — Z96611 Presence of right artificial shoulder joint: Secondary | ICD-10-CM | POA: Diagnosis present

## 2023-02-17 DIAGNOSIS — E119 Type 2 diabetes mellitus without complications: Secondary | ICD-10-CM | POA: Diagnosis present

## 2023-02-17 LAB — BASIC METABOLIC PANEL
Anion gap: 11 (ref 5–15)
Anion gap: 11 (ref 5–15)
BUN: 6 mg/dL — ABNORMAL LOW (ref 8–23)
BUN: 8 mg/dL (ref 8–23)
CO2: 29 mmol/L (ref 22–32)
CO2: 29 mmol/L (ref 22–32)
Calcium: 9.2 mg/dL (ref 8.9–10.3)
Calcium: 9.3 mg/dL (ref 8.9–10.3)
Chloride: 93 mmol/L — ABNORMAL LOW (ref 98–111)
Chloride: 95 mmol/L — ABNORMAL LOW (ref 98–111)
Creatinine, Ser: 0.96 mg/dL (ref 0.61–1.24)
Creatinine, Ser: 1.08 mg/dL (ref 0.61–1.24)
GFR, Estimated: >60 mL/min (ref >60)
GFR, Estimated: >60 mL/min (ref >60)
Glucose, Bld: 119 mg/dL — ABNORMAL HIGH (ref 70–99)
Glucose, Bld: 143 mg/dL — ABNORMAL HIGH (ref 70–99)
Potassium: 2.7 mmol/L — CL (ref 3.5–5.1)
Potassium: 2.8 mmol/L — ABNORMAL LOW (ref 3.5–5.1)
Sodium: 133 mmol/L — ABNORMAL LOW (ref 135–145)
Sodium: 135 mmol/L (ref 135–145)

## 2023-02-17 LAB — GLUCOSE, CAPILLARY
Glucose-Capillary: 134 mg/dL — ABNORMAL HIGH (ref 70–99)
Glucose-Capillary: 124 mg/dL — ABNORMAL HIGH (ref 70–99)
Glucose-Capillary: 125 mg/dL — ABNORMAL HIGH (ref 70–99)
Glucose-Capillary: 109 mg/dL — ABNORMAL HIGH (ref 70–99)

## 2023-02-17 LAB — HEMOGLOBIN AND HEMATOCRIT, BLOOD
HCT: 41 % (ref 39.0–52.0)
Hemoglobin: 13.2 g/dL (ref 13.0–17.0)

## 2023-02-17 MED ORDER — BISACODYL 10 MG RE SUPP
10.0000 mg | Freq: Once | RECTAL | Status: AC
  Administered 2023-02-18: 10 mg via RECTAL
  Filled 2023-02-17: qty 1

## 2023-02-17 MED ORDER — POTASSIUM CHLORIDE 20 MEQ PO PACK
60.0000 meq | PACK | Freq: Once | ORAL | Status: AC
  Administered 2023-02-17: 60 meq via ORAL
  Filled 2023-02-17: qty 3

## 2023-02-17 MED ORDER — ACETAMINOPHEN 500 MG PO TABS
1000.0000 mg | ORAL_TABLET | Freq: Four times a day (QID) | ORAL | Status: DC
  Administered 2023-02-17 – 2023-02-18 (×5): 1000 mg via ORAL
  Filled 2023-02-17 (×5): qty 2

## 2023-02-17 NOTE — Progress Notes (Signed)
Urology Progress Note   2 Days Post-Op s/p RALP, Bilateral PLND, Right Inguinal Hernia Repair on 02/15/2023  Subjective: No acute events overnight. Afebrile and stable. Pain well controlled with PRNs. Making adequate urine via foley catheter. Ambulated several times. Tolerating regular diet.   Objective: Vital signs in last 24 hours: Temp:  [98 F (36.7 C)-98.5 F (36.9 C)] 98.5 F (36.9 C) (04/12 1317) Pulse Rate:  [85-109] 105 (04/12 1317) Resp:  [15-19] 19 (04/12 1317) BP: (105-135)/(70-85) 113/70 (04/12 1317) SpO2:  [96 %-100 %] 96 % (04/12 1317)  Intake/Output from previous day: 04/11 0701 - 04/12 0700 In: 1500 [P.O.:1500] Out: 3020 [Urine:2900; Drains:120] Intake/Output this shift: Total I/O In: 600 [P.O.:600] Out: 180 [Urine:150; Drains:30]  Physical Exam:  General: Alert and oriented CV: Regular rate Lungs: Normal work of breathing on room air Abdomen: Soft, appropriately tender, nondistended. Incisions c/d/i GU: Foley in place draining clear yellow urine  Ext: NT, No erythema  Lab Results: Recent Labs    02/15/23 1536 02/16/23 0532 02/17/23 0628  HGB 12.8* 13.0 13.2  HCT 39.4 40.8 41.0    BMET Recent Labs    02/17/23 0628 02/17/23 1008  NA 133* 135  K 2.7* 2.8*  CL 93* 95*  CO2 29 29  GLUCOSE 119* 143*  BUN 6* 8  CREATININE 0.96 1.08  CALCIUM 9.2 9.3      Studies/Results: No results found.  Assessment/Plan:  62 y.o. male s/p RALP, Bilateral PLND, Right Inguinal Hernia Repair on 02/15/2023  - Scheduled Tylenol, PRN oxycodone and PRN dilaudid - Ambulate, OOB, IS  - Regular diet - Medlock - Continue foley catheter s/p urologic procedure. TOV outpatient at [facility] - JP out day of discharge - Surgical pathology in process  Dispo: Floor status   LOS: 0 days   Stephen [name] Stephen [name] 02/17/2023, 4:01 PM

## 2023-02-18 LAB — GLUCOSE, CAPILLARY: Glucose-Capillary: 123 mg/dL — ABNORMAL HIGH (ref 70–99)

## 2023-02-18 NOTE — Plan of Care (Signed)
  Problem: Education: Goal: Ability to describe self-care measures that may prevent or decrease complications (Diabetes Survival Skills Education) will improve Outcome: Adequate for Discharge Goal: Individualized Educational Video(s) Outcome: Adequate for Discharge   Problem: Coping: Goal: Ability to adjust to condition or change in health will improve Outcome: Adequate for Discharge   Problem: Fluid Volume: Goal: Ability to maintain a balanced intake and output will improve Outcome: Adequate for Discharge   Problem: Health Behavior/Discharge Planning: Goal: Ability to identify and utilize available resources and services will improve Outcome: Adequate for Discharge Goal: Ability to manage health-related needs will improve Outcome: Adequate for Discharge   Problem: Metabolic: Goal: Ability to maintain appropriate glucose levels will improve Outcome: Adequate for Discharge   Problem: Nutritional: Goal: Maintenance of adequate nutrition will improve Outcome: Adequate for Discharge Goal: Progress toward achieving an optimal weight will improve Outcome: Adequate for Discharge   Problem: Skin Integrity: Goal: Risk for impaired skin integrity will decrease Outcome: Adequate for Discharge   Problem: Tissue Perfusion: Goal: Adequacy of tissue perfusion will improve Outcome: Adequate for Discharge   Problem: Education: Goal: Knowledge of the procedure and recovery process will improve Outcome: Adequate for Discharge   Problem: Bowel/Gastric: Goal: Gastrointestinal status for postoperative course will improve Outcome: Adequate for Discharge   Problem: Pain Management: Goal: General experience of comfort will improve Outcome: Adequate for Discharge   Problem: Skin Integrity: Goal: Demonstration of wound healing without infection will improve Outcome: Adequate for Discharge   Problem: Urinary Elimination: Goal: Ability to avoid or minimize complications of infection will  improve Outcome: Adequate for Discharge Goal: Ability to achieve and maintain urine output will improve Outcome: Adequate for Discharge Goal: Home care management will improve Outcome: Adequate for Discharge   Problem: Education: Goal: Knowledge of General Education information will improve Description: Including pain rating scale, medication(s)/side effects and non-pharmacologic comfort measures Outcome: Adequate for Discharge   Problem: Health Behavior/Discharge Planning: Goal: Ability to manage health-related needs will improve Outcome: Adequate for Discharge   Problem: Clinical Measurements: Goal: Ability to maintain clinical measurements within normal limits will improve Outcome: Adequate for Discharge Goal: Will remain free from infection Outcome: Adequate for Discharge Goal: Diagnostic test results will improve Outcome: Adequate for Discharge Goal: Respiratory complications will improve Outcome: Adequate for Discharge Goal: Cardiovascular complication will be avoided Outcome: Adequate for Discharge   Problem: Activity: Goal: Risk for activity intolerance will decrease Outcome: Adequate for Discharge   Problem: Nutrition: Goal: Adequate nutrition will be maintained Outcome: Adequate for Discharge   Problem: Coping: Goal: Level of anxiety will decrease Outcome: Adequate for Discharge   Problem: Elimination: Goal: Will not experience complications related to bowel motility Outcome: Adequate for Discharge Goal: Will not experience complications related to urinary retention Outcome: Adequate for Discharge   Problem: Pain Managment: Goal: General experience of comfort will improve Outcome: Adequate for Discharge   Problem: Safety: Goal: Ability to remain free from injury will improve Outcome: Adequate for Discharge   Problem: Skin Integrity: Goal: Risk for impaired skin integrity will decrease Outcome: Adequate for Discharge

## 2023-02-18 NOTE — Progress Notes (Signed)
Patient given discharge, follow up, medication, and foley catheter care instructions with teachback, verbalized understanding, IV x 2 removed, JP drain removed and dressing placed, personal belongings with patient, family to transport home

## 2023-02-20 ENCOUNTER — Telehealth: Payer: Self-pay

## 2023-02-20 NOTE — Transitions of Care (Post Inpatient/ED Visit) (Signed)
   02/20/2023  Name: Stephen [name] MRN: [id number] DOB: November 01, 1961  Today's TOC FU Call Status: Today's TOC FU Call Status:: Successful TOC FU Call Competed TOC FU Call Complete Date: 02/20/23  Transition Care Management Follow-up Telephone Call Date of Discharge: 02/18/23 Discharge Facility: [facility]) Type of Discharge: Inpatient Admission Primary Inpatient Discharge Diagnosis:: malignant neoplasm of prostate How have you been since you were released from the hospital?: Better Any questions or concerns?: No  Items Reviewed: Did you receive and understand the discharge instructions provided?: Yes Medications obtained and verified?: Yes (Medications Reviewed) Any new allergies since your discharge?: No Dietary orders reviewed?: Yes Do you have support at home?: Yes People in Home: other relative(s)  Home Care and Equipment/Supplies: Were Home Health Services Ordered?: NA Any new equipment or medical supplies ordered?: NA  Functional Questionnaire: Do you need assistance with bathing/showering or dressing?: No Do you need assistance with meal preparation?: No Do you need assistance with eating?: No Do you have difficulty maintaining continence: No Do you need assistance with getting out of bed/getting out of a chair/moving?: No Do you have difficulty managing or taking your medications?: No  Follow up appointments reviewed: PCP Follow-up appointment confirmed?: NA Specialist Hospital Follow-up appointment confirmed?: Yes Date of Specialist follow-up appointment?: 02/27/23 Follow-Up Specialty Provider:: Dr Berneice Heinrich Do you need transportation to your follow-up appointment?: No Do you understand care options if your condition(s) worsen?: Yes-patient verbalized understanding    SIGNATURE Karena Addison, LPN [facility] Nurse Health Advisor Direct Dial [phone]

## 2023-02-21 NOTE — Discharge Summary (Signed)
Physician Discharge Summary  Patient ID: Stephen [name] MRN: [id number] DOB/AGE: Nov 26, 1960 62 y.o.  Admit date: 02/15/2023 Discharge date: 02/18/2023  Admission Diagnoses:  Prostate cancer  Discharge Diagnoses:  Principal Problem:   Prostate cancer   Past Medical History:  Diagnosis Date   Arthritis    Cancer    Chronic kidney disease    GERD (gastroesophageal reflux disease)    Pancreatitis    Pancreatitis    10-2022   Tuberculosis    yrs. ago pt. states he was dx. here at [facility]    Surgeries: Procedure(s): XI ROBOTIC ASSISTED LAPAROSCOPIC RADICAL PROSTATECTOMY WITH INDOCYANINEGREEN DYE INJECTION PELVIC LYMPH NODE DISSECTION LAPAROSCOPIC INGUINAL HERNIA on 02/15/2023   Consultants (if any):   Discharged Condition: Improved  Hospital Course: Stephen [name] is an 62 y.o. male who was admitted 02/15/2023 with a diagnosis of Prostate cancer and went to the operating room on 02/15/2023 and underwent the above named procedures.    He was given perioperative antibiotics:  Anti-infectives (From admission, onward)    Start     Dose/Rate Route Frequency Ordered Stop   02/15/23 1002  ceFAZolin (ANCEF) IVPB 2g/100 mL premix        2 g 200 mL/hr over 30 Minutes Intravenous 30 min pre-op 02/15/23 1002 02/15/23 1236   02/15/23 0000  sulfamethoxazole-trimethoprim (BACTRIM DS) 800-160 MG tablet        1 tablet Oral 2 times daily 02/15/23 1512       .  He was given sequential compression devices, early ambulation for DVT prophylaxis.  He benefited maximally from the hospital stay and there were no complications.    Recent vital signs:  Vitals:   02/18/23 0109 02/18/23 0603  BP: 125/86 (!) 133/91  Pulse: (!) 106 (!) 110  Resp: 16 16  Temp: 98.2 F (36.8 C) 98.2 F (36.8 C)  SpO2: 100% 100%    Recent laboratory studies:  Lab Results  Component Value Date   HGB 13.2 02/17/2023   HGB 13.0 02/16/2023   HGB 12.8 (L) 02/15/2023   Lab Results  Component Value Date   WBC  7.2 02/13/2023   PLT 408 (H) 02/13/2023   No results found for: "INR" Lab Results  Component Value Date   NA 135 02/17/2023   K 2.8 (L) 02/17/2023   CL 95 (L) 02/17/2023   CO2 29 02/17/2023   BUN 8 02/17/2023   CREATININE 1.08 02/17/2023   GLUCOSE 143 (H) 02/17/2023    Discharge Medications:   Allergies as of 02/18/2023   No Known Allergies      Medication List     TAKE these medications    chlorthalidone 25 MG tablet Commonly known as: HYGROTON Take 1 tablet (25 mg total) by mouth daily.   dicyclomine 20 MG tablet Commonly known as: BENTYL Take 1 tablet (20 mg total) by mouth 2 (two) times daily as needed for spasms.   docusate sodium 100 MG capsule Commonly known as: COLACE Take 1 capsule (100 mg total) by mouth 2 (two) times daily.   HYDROcodone-acetaminophen 5-325 MG tablet Commonly known as: Norco Take 1-2 tablets by mouth every 6 (six) hours as needed for moderate pain or severe pain.   lipase/protease/amylase 49675 UNITS Cpep capsule Commonly known as: CREON Take 36,000-72,000 Units by mouth See admin instructions. Take 72,000 units (2 capsules) by mouth three times a day with meals and 36,000 units (1 capsule) with snacks.   metFORMIN 500 MG tablet Commonly known as: GLUCOPHAGE Take 500  mg by mouth daily.   omeprazole 40 MG capsule Commonly known as: PRILOSEC Take 40 mg by mouth daily.   ondansetron 4 MG tablet Commonly known as: ZOFRAN Take 1 tablet (4 mg total) by mouth every 6 (six) hours.   Potassium Chloride 10 MEQ Pack Take 10 mEq by mouth daily.   rosuvastatin 40 MG tablet Commonly known as: CRESTOR Take 40 mg by mouth daily.   sildenafil 100 MG tablet Commonly known as: VIAGRA Take 50-100 mg by mouth daily as needed for erectile dysfunction.   sulfamethoxazole-trimethoprim 800-160 MG tablet Commonly known as: BACTRIM DS Take 1 tablet by mouth 2 (two) times daily. What changed: Another medication with the same name was added. Make  sure you understand how and when to take each.   sulfamethoxazole-trimethoprim 800-160 MG tablet Commonly known as: BACTRIM DS Take 1 tablet by mouth 2 (two) times daily. Start the day prior to foley removal appointment What changed: You were already taking a medication with the same name, and this prescription was added. Make sure you understand how and when to take each.        Diagnostic Studies: No results found.  Disposition: Discharge disposition: 01-Home or Self Care          Follow-up Information     Sebastian Ache, MD Follow up on 02/27/2023.   Specialty: Urology Why: at 10:30 for MD visit and catheter removal. Contact information: [address] [place] [address] [phone]                  Signed: Wilkie Aye 02/21/2023, 9:59 AM

## 2023-02-22 LAB — SURGICAL PATHOLOGY
